# Patient Record
Sex: Female | Born: 1945
Health system: Southern US, Community
[De-identification: ages and names within clinical notes are randomized; demographics above are authoritative.]

## PROBLEM LIST (undated history)

## (undated) DIAGNOSIS — R569 Unspecified convulsions: Secondary | ICD-10-CM

## (undated) DIAGNOSIS — C801 Malignant (primary) neoplasm, unspecified: Secondary | ICD-10-CM

## (undated) DIAGNOSIS — M109 Gout, unspecified: Secondary | ICD-10-CM

## (undated) DIAGNOSIS — G4730 Sleep apnea, unspecified: Secondary | ICD-10-CM

## (undated) DIAGNOSIS — I10 Essential (primary) hypertension: Secondary | ICD-10-CM

## (undated) DIAGNOSIS — K219 Gastro-esophageal reflux disease without esophagitis: Secondary | ICD-10-CM

## (undated) DIAGNOSIS — I714 Abdominal aortic aneurysm, without rupture, unspecified: Secondary | ICD-10-CM

## (undated) DIAGNOSIS — I2510 Atherosclerotic heart disease of native coronary artery without angina pectoris: Secondary | ICD-10-CM

## (undated) DIAGNOSIS — N189 Chronic kidney disease, unspecified: Secondary | ICD-10-CM

## (undated) DIAGNOSIS — I509 Heart failure, unspecified: Secondary | ICD-10-CM

## (undated) DIAGNOSIS — E119 Type 2 diabetes mellitus without complications: Secondary | ICD-10-CM

## (undated) DIAGNOSIS — J449 Chronic obstructive pulmonary disease, unspecified: Secondary | ICD-10-CM

## (undated) DIAGNOSIS — H548 Legal blindness, as defined in USA: Secondary | ICD-10-CM

## (undated) DIAGNOSIS — T4145XA Adverse effect of unspecified anesthetic, initial encounter: Secondary | ICD-10-CM

## (undated) DIAGNOSIS — N83201 Unspecified ovarian cyst, right side: Secondary | ICD-10-CM

## (undated) DIAGNOSIS — R0600 Dyspnea, unspecified: Secondary | ICD-10-CM

## (undated) HISTORY — PX: OOPHORECTOMY: SHX86

## (undated) HISTORY — PX: CHOLECYSTECTOMY: SHX55

## (undated) HISTORY — PX: OTHER SURGICAL HISTORY: SHX169

---

## 2004-03-18 ENCOUNTER — Other Ambulatory Visit: Payer: Self-pay

## 2004-03-18 ENCOUNTER — Inpatient Hospital Stay: Payer: Self-pay

## 2004-03-19 ENCOUNTER — Other Ambulatory Visit: Payer: Self-pay

## 2005-01-11 ENCOUNTER — Emergency Department: Payer: Self-pay | Admitting: Emergency Medicine

## 2005-01-11 ENCOUNTER — Other Ambulatory Visit: Payer: Self-pay

## 2005-01-13 ENCOUNTER — Inpatient Hospital Stay: Payer: Self-pay | Admitting: Internal Medicine

## 2005-01-13 ENCOUNTER — Other Ambulatory Visit: Payer: Self-pay

## 2005-03-11 ENCOUNTER — Other Ambulatory Visit: Payer: Self-pay

## 2005-03-12 ENCOUNTER — Observation Stay: Payer: Self-pay | Admitting: Infectious Diseases

## 2005-03-22 ENCOUNTER — Ambulatory Visit: Payer: Self-pay | Admitting: Nurse Practitioner

## 2009-04-09 ENCOUNTER — Emergency Department: Payer: Self-pay | Admitting: Emergency Medicine

## 2009-08-29 ENCOUNTER — Ambulatory Visit: Payer: Self-pay | Admitting: Unknown Physician Specialty

## 2010-09-28 ENCOUNTER — Ambulatory Visit: Payer: Self-pay | Admitting: Unknown Physician Specialty

## 2011-01-09 ENCOUNTER — Ambulatory Visit: Payer: Self-pay | Admitting: Otolaryngology

## 2011-01-10 LAB — PATHOLOGY REPORT

## 2011-02-17 ENCOUNTER — Emergency Department: Payer: Self-pay | Admitting: Emergency Medicine

## 2011-03-18 ENCOUNTER — Inpatient Hospital Stay: Payer: Self-pay | Admitting: Internal Medicine

## 2011-05-19 ENCOUNTER — Emergency Department: Payer: Self-pay | Admitting: Emergency Medicine

## 2011-05-28 DIAGNOSIS — R569 Unspecified convulsions: Secondary | ICD-10-CM

## 2011-05-28 DIAGNOSIS — T8859XA Other complications of anesthesia, initial encounter: Secondary | ICD-10-CM

## 2011-05-28 HISTORY — PX: BYPASS GRAFT: SHX909

## 2011-05-28 HISTORY — PX: CARDIAC SURGERY: SHX584

## 2011-05-28 HISTORY — DX: Other complications of anesthesia, initial encounter: T88.59XA

## 2011-05-28 HISTORY — PX: CORONARY ARTERY BYPASS GRAFT: SHX141

## 2011-05-28 HISTORY — DX: Unspecified convulsions: R56.9

## 2011-06-03 ENCOUNTER — Ambulatory Visit: Payer: Self-pay | Admitting: Internal Medicine

## 2011-06-12 DIAGNOSIS — Z951 Presence of aortocoronary bypass graft: Secondary | ICD-10-CM

## 2011-06-12 HISTORY — PX: CORONARY ARTERY BYPASS GRAFT: SHX141

## 2011-06-12 HISTORY — DX: Presence of aortocoronary bypass graft: Z95.1

## 2011-12-16 DIAGNOSIS — D3A090 Benign carcinoid tumor of the bronchus and lung: Secondary | ICD-10-CM | POA: Insufficient documentation

## 2011-12-20 DIAGNOSIS — Z951 Presence of aortocoronary bypass graft: Secondary | ICD-10-CM | POA: Insufficient documentation

## 2011-12-20 DIAGNOSIS — H3530 Unspecified macular degeneration: Secondary | ICD-10-CM | POA: Insufficient documentation

## 2011-12-20 DIAGNOSIS — I4891 Unspecified atrial fibrillation: Secondary | ICD-10-CM | POA: Insufficient documentation

## 2012-07-01 ENCOUNTER — Ambulatory Visit: Payer: Self-pay | Admitting: Unknown Physician Specialty

## 2012-12-23 ENCOUNTER — Observation Stay: Payer: Self-pay | Admitting: Internal Medicine

## 2012-12-23 LAB — TROPONIN I
Troponin-I: <0.02 ng/mL
Troponin-I: <0.02 ng/mL
Troponin-I: <0.02 ng/mL

## 2012-12-23 LAB — HEPATIC FUNCTION PANEL A (ARMC)
Albumin: 3.6 g/dL (ref 3.4–5.0)
Alkaline Phosphatase: 63 U/L (ref 50–136)
Bilirubin, Direct: <0.05 mg/dL (ref 0.00–0.20)
Bilirubin,Total: 0.3 mg/dL (ref 0.2–1.0)
SGOT(AST): 21 U/L (ref 15–37)
SGPT (ALT): 20 U/L (ref 12–78)
Total Protein: 7.5 g/dL (ref 6.4–8.2)

## 2012-12-23 LAB — CBC
HCT: 38.1 % (ref 35.0–47.0)
HGB: 12.7 g/dL (ref 12.0–16.0)
MCH: 28.2 pg (ref 26.0–34.0)
MCHC: 33.5 g/dL (ref 32.0–36.0)
MCV: 84 fL (ref 80–100)
Platelet: 156 10*3/uL (ref 150–440)
RBC: 4.52 10*6/uL (ref 3.80–5.20)
RDW: 14.9 % — ABNORMAL HIGH (ref 11.5–14.5)
WBC: 5.4 10*3/uL (ref 3.6–11.0)

## 2012-12-23 LAB — BASIC METABOLIC PANEL
Anion Gap: 3 — ABNORMAL LOW (ref 7–16)
BUN: 34 mg/dL — ABNORMAL HIGH (ref 7–18)
Calcium, Total: 9.2 mg/dL (ref 8.5–10.1)
Chloride: 108 mmol/L — ABNORMAL HIGH (ref 98–107)
Co2: 30 mmol/L (ref 21–32)
Creatinine: 2.13 mg/dL — ABNORMAL HIGH (ref 0.60–1.30)
EGFR (African American): 27 — ABNORMAL LOW
EGFR (Non-African Amer.): 23 — ABNORMAL LOW
Glucose: 117 mg/dL — ABNORMAL HIGH (ref 65–99)
Osmolality: 290 (ref 275–301)
Potassium: 4.3 mmol/L (ref 3.5–5.1)
Sodium: 141 mmol/L (ref 136–145)

## 2012-12-23 LAB — CK
CK, Total: 119 U/L (ref 21–215)
CK, Total: 116 U/L (ref 21–215)

## 2012-12-23 LAB — PRO B NATRIURETIC PEPTIDE: B-Type Natriuretic Peptide: 562 pg/mL — ABNORMAL HIGH (ref 0–125)

## 2012-12-23 LAB — CK TOTAL AND CKMB (NOT AT ARMC): CK-MB: 0.9 ng/mL (ref 0.5–3.6)

## 2012-12-24 DIAGNOSIS — R079 Chest pain, unspecified: Secondary | ICD-10-CM

## 2012-12-24 LAB — BASIC METABOLIC PANEL
Anion Gap: 4 — ABNORMAL LOW (ref 7–16)
BUN: 33 mg/dL — ABNORMAL HIGH (ref 7–18)
Calcium, Total: 8.4 mg/dL — ABNORMAL LOW (ref 8.5–10.1)
Chloride: 111 mmol/L — ABNORMAL HIGH (ref 98–107)
Co2: 28 mmol/L (ref 21–32)
Creatinine: 2.04 mg/dL — ABNORMAL HIGH (ref 0.60–1.30)
EGFR (African American): 28 — ABNORMAL LOW
EGFR (Non-African Amer.): 24 — ABNORMAL LOW
Potassium: 4.3 mmol/L (ref 3.5–5.1)
Sodium: 143 mmol/L (ref 136–145)

## 2012-12-24 LAB — CBC WITH DIFFERENTIAL/PLATELET
Basophil #: 0 10*3/uL (ref 0.0–0.1)
Basophil %: 0.5 %
Eosinophil #: 0.1 10*3/uL (ref 0.0–0.7)
Eosinophil %: 3.2 %
HGB: 12 g/dL (ref 12.0–16.0)
MCH: 27.9 pg (ref 26.0–34.0)
MCHC: 33.2 g/dL (ref 32.0–36.0)
MCV: 84 fL (ref 80–100)
Monocyte #: 0.4 x10 3/mm (ref 0.2–0.9)
Monocyte %: 9.2 %
Neutrophil #: 2.4 10*3/uL (ref 1.4–6.5)
Neutrophil %: 53 %
Platelet: 138 10*3/uL — ABNORMAL LOW (ref 150–440)
RDW: 14.4 % (ref 11.5–14.5)

## 2012-12-24 LAB — TSH: Thyroid Stimulating Horm: 0.25 u[IU]/mL — ABNORMAL LOW

## 2012-12-24 LAB — HEMOGLOBIN A1C: Hemoglobin A1C: 7.2 % — ABNORMAL HIGH (ref 4.2–6.3)

## 2012-12-24 LAB — LIPID PANEL
Cholesterol: 111 mg/dL (ref 0–200)
HDL Cholesterol: 31 mg/dL — ABNORMAL LOW (ref 40–60)
Ldl Cholesterol, Calc: 58 mg/dL (ref 0–100)
Triglycerides: 112 mg/dL (ref 0–200)
VLDL Cholesterol, Calc: 22 mg/dL (ref 5–40)

## 2012-12-24 LAB — MAGNESIUM: Magnesium: 2.1 mg/dL

## 2013-05-27 HISTORY — PX: BRONCHOSCOPY: SUR163

## 2014-02-28 ENCOUNTER — Emergency Department: Payer: Self-pay | Admitting: Emergency Medicine

## 2014-02-28 LAB — CBC
HCT: 40 % (ref 35.0–47.0)
HGB: 12.9 g/dL (ref 12.0–16.0)
MCH: 28.4 pg (ref 26.0–34.0)
MCHC: 32.1 g/dL (ref 32.0–36.0)
MCV: 88 fL (ref 80–100)
PLATELETS: 173 10*3/uL (ref 150–440)
RBC: 4.53 10*6/uL (ref 3.80–5.20)
RDW: 14.5 % (ref 11.5–14.5)
WBC: 4.8 10*3/uL (ref 3.6–11.0)

## 2014-02-28 LAB — URINALYSIS, COMPLETE
BACTERIA: NONE SEEN
Bilirubin,UR: NEGATIVE
GLUCOSE, UR: NEGATIVE mg/dL (ref 0–75)
KETONE: NEGATIVE
Leukocyte Esterase: NEGATIVE
NITRITE: NEGATIVE
PH: 5 (ref 4.5–8.0)
Protein: NEGATIVE
RBC, UR: NONE SEEN /HPF (ref 0–5)
Specific Gravity: 1.008 (ref 1.003–1.030)
WBC UR: <1 /HPF (ref 0–5)

## 2014-02-28 LAB — BASIC METABOLIC PANEL
Anion Gap: 5 — ABNORMAL LOW (ref 7–16)
BUN: 30 mg/dL — ABNORMAL HIGH (ref 7–18)
CALCIUM: 8.8 mg/dL (ref 8.5–10.1)
CHLORIDE: 109 mmol/L — AB (ref 98–107)
CO2: 27 mmol/L (ref 21–32)
CREATININE: 1.84 mg/dL — AB (ref 0.60–1.30)
EGFR (Non-African Amer.): 29 — ABNORMAL LOW
GFR CALC AF AMER: 35 — AB
Glucose: 75 mg/dL (ref 65–99)
OSMOLALITY: 286 (ref 275–301)
Potassium: 4.2 mmol/L (ref 3.5–5.1)
SODIUM: 141 mmol/L (ref 136–145)

## 2014-02-28 LAB — LIPASE, BLOOD: LIPASE: 261 U/L (ref 73–393)

## 2014-02-28 LAB — TROPONIN I: Troponin-I: <0.02 ng/mL

## 2014-03-29 ENCOUNTER — Emergency Department: Payer: Self-pay | Admitting: Emergency Medicine

## 2014-03-29 LAB — COMPREHENSIVE METABOLIC PANEL
ALBUMIN: 3.5 g/dL (ref 3.4–5.0)
ALK PHOS: 77 U/L
AST: 16 U/L (ref 15–37)
Anion Gap: 6 — ABNORMAL LOW (ref 7–16)
BILIRUBIN TOTAL: 0.3 mg/dL (ref 0.2–1.0)
BUN: 28 mg/dL — ABNORMAL HIGH (ref 7–18)
CO2: 25 mmol/L (ref 21–32)
Calcium, Total: 8.6 mg/dL (ref 8.5–10.1)
Chloride: 113 mmol/L — ABNORMAL HIGH (ref 98–107)
Creatinine: 1.71 mg/dL — ABNORMAL HIGH (ref 0.60–1.30)
EGFR (African American): 38 — ABNORMAL LOW
EGFR (Non-African Amer.): 31 — ABNORMAL LOW
Glucose: 72 mg/dL (ref 65–99)
Osmolality: 291 (ref 275–301)
POTASSIUM: 4.4 mmol/L (ref 3.5–5.1)
SGPT (ALT): 21 U/L
SODIUM: 144 mmol/L (ref 136–145)
Total Protein: 7.1 g/dL (ref 6.4–8.2)

## 2014-03-29 LAB — URINALYSIS, COMPLETE
BACTERIA: NONE SEEN
Bilirubin,UR: NEGATIVE
Glucose,UR: NEGATIVE mg/dL (ref 0–75)
Ketone: NEGATIVE
LEUKOCYTE ESTERASE: NEGATIVE
Nitrite: NEGATIVE
PROTEIN: NEGATIVE
Ph: 5 (ref 4.5–8.0)
RBC,UR: 1 /HPF (ref 0–5)
Specific Gravity: 1.016 (ref 1.003–1.030)
WBC UR: 1 /HPF (ref 0–5)

## 2014-03-29 LAB — CBC WITH DIFFERENTIAL/PLATELET
BASOS ABS: 0.1 10*3/uL (ref 0.0–0.1)
BASOS PCT: 1.4 %
EOS ABS: 0.1 10*3/uL (ref 0.0–0.7)
Eosinophil %: 3 %
HCT: 39.3 % (ref 35.0–47.0)
HGB: 12.6 g/dL (ref 12.0–16.0)
LYMPHS ABS: 1.5 10*3/uL (ref 1.0–3.6)
LYMPHS PCT: 31.9 %
MCH: 28.5 pg (ref 26.0–34.0)
MCHC: 32.1 g/dL (ref 32.0–36.0)
MCV: 89 fL (ref 80–100)
MONO ABS: 0.5 x10 3/mm (ref 0.2–0.9)
Monocyte %: 10 %
NEUTROS PCT: 53.7 %
Neutrophil #: 2.5 10*3/uL (ref 1.4–6.5)
Platelet: 160 10*3/uL (ref 150–440)
RBC: 4.42 10*6/uL (ref 3.80–5.20)
RDW: 14.2 % (ref 11.5–14.5)
WBC: 4.6 10*3/uL (ref 3.6–11.0)

## 2014-03-29 LAB — TROPONIN I

## 2014-03-31 ENCOUNTER — Ambulatory Visit: Payer: Self-pay | Admitting: Vascular Surgery

## 2014-04-06 ENCOUNTER — Inpatient Hospital Stay: Payer: Self-pay | Admitting: Vascular Surgery

## 2014-04-07 LAB — CBC WITH DIFFERENTIAL/PLATELET
BASOS PCT: 0.5 %
Basophil #: 0 10*3/uL (ref 0.0–0.1)
EOS PCT: 2 %
Eosinophil #: 0.1 10*3/uL (ref 0.0–0.7)
HCT: 32.2 % — ABNORMAL LOW (ref 35.0–47.0)
HGB: 10.3 g/dL — AB (ref 12.0–16.0)
LYMPHS PCT: 22.5 %
Lymphocyte #: 1 10*3/uL (ref 1.0–3.6)
MCH: 28.6 pg (ref 26.0–34.0)
MCHC: 31.9 g/dL — ABNORMAL LOW (ref 32.0–36.0)
MCV: 90 fL (ref 80–100)
MONOS PCT: 7.4 %
Monocyte #: 0.3 x10 3/mm (ref 0.2–0.9)
Neutrophil #: 2.9 10*3/uL (ref 1.4–6.5)
Neutrophil %: 67.6 %
Platelet: 115 10*3/uL — ABNORMAL LOW (ref 150–440)
RBC: 3.59 10*6/uL — ABNORMAL LOW (ref 3.80–5.20)
RDW: 14.2 % (ref 11.5–14.5)
WBC: 4.3 10*3/uL (ref 3.6–11.0)

## 2014-04-07 LAB — COMPREHENSIVE METABOLIC PANEL
ALBUMIN: 2.7 g/dL — AB (ref 3.4–5.0)
ALT: 32 U/L
AST: 29 U/L (ref 15–37)
Alkaline Phosphatase: 61 U/L
Anion Gap: 6 — ABNORMAL LOW (ref 7–16)
BUN: 25 mg/dL — ABNORMAL HIGH (ref 7–18)
Bilirubin,Total: 0.3 mg/dL (ref 0.2–1.0)
Calcium, Total: 7.8 mg/dL — ABNORMAL LOW (ref 8.5–10.1)
Chloride: 108 mmol/L — ABNORMAL HIGH (ref 98–107)
Co2: 31 mmol/L (ref 21–32)
Creatinine: 1.95 mg/dL — ABNORMAL HIGH (ref 0.60–1.30)
EGFR (African American): 33 — ABNORMAL LOW
EGFR (Non-African Amer.): 27 — ABNORMAL LOW
GLUCOSE: 90 mg/dL (ref 65–99)
Osmolality: 293 (ref 275–301)
Potassium: 4.5 mmol/L (ref 3.5–5.1)
Sodium: 145 mmol/L (ref 136–145)
Total Protein: 5.8 g/dL — ABNORMAL LOW (ref 6.4–8.2)

## 2014-04-07 LAB — PROTIME-INR
INR: 1.1
PROTHROMBIN TIME: 14.2 s (ref 11.5–14.7)

## 2014-04-07 LAB — PHOSPHORUS: PHOSPHORUS: 4.4 mg/dL (ref 2.5–4.9)

## 2014-04-07 LAB — APTT: ACTIVATED PTT: 33.8 s (ref 23.6–35.9)

## 2014-04-07 LAB — MAGNESIUM: Magnesium: 2 mg/dL

## 2014-06-29 DIAGNOSIS — E1121 Type 2 diabetes mellitus with diabetic nephropathy: Secondary | ICD-10-CM | POA: Diagnosis not present

## 2014-06-29 DIAGNOSIS — I10 Essential (primary) hypertension: Secondary | ICD-10-CM | POA: Diagnosis not present

## 2014-06-29 DIAGNOSIS — R300 Dysuria: Secondary | ICD-10-CM | POA: Diagnosis not present

## 2014-06-29 DIAGNOSIS — M79671 Pain in right foot: Secondary | ICD-10-CM | POA: Diagnosis not present

## 2014-06-29 DIAGNOSIS — N390 Urinary tract infection, site not specified: Secondary | ICD-10-CM | POA: Diagnosis not present

## 2014-07-08 DIAGNOSIS — M79671 Pain in right foot: Secondary | ICD-10-CM | POA: Diagnosis not present

## 2014-07-28 DIAGNOSIS — I714 Abdominal aortic aneurysm, without rupture: Secondary | ICD-10-CM | POA: Diagnosis not present

## 2014-07-28 DIAGNOSIS — F17200 Nicotine dependence, unspecified, uncomplicated: Secondary | ICD-10-CM | POA: Diagnosis not present

## 2014-07-28 DIAGNOSIS — I739 Peripheral vascular disease, unspecified: Secondary | ICD-10-CM | POA: Diagnosis not present

## 2014-07-28 DIAGNOSIS — I10 Essential (primary) hypertension: Secondary | ICD-10-CM | POA: Diagnosis not present

## 2014-08-17 DIAGNOSIS — M10071 Idiopathic gout, right ankle and foot: Secondary | ICD-10-CM | POA: Diagnosis not present

## 2014-08-17 DIAGNOSIS — I25810 Atherosclerosis of coronary artery bypass graft(s) without angina pectoris: Secondary | ICD-10-CM | POA: Diagnosis not present

## 2014-08-17 DIAGNOSIS — N183 Chronic kidney disease, stage 3 (moderate): Secondary | ICD-10-CM | POA: Diagnosis not present

## 2014-08-17 DIAGNOSIS — E1121 Type 2 diabetes mellitus with diabetic nephropathy: Secondary | ICD-10-CM | POA: Diagnosis not present

## 2014-09-09 DIAGNOSIS — G4730 Sleep apnea, unspecified: Secondary | ICD-10-CM | POA: Diagnosis not present

## 2014-09-09 DIAGNOSIS — I2510 Atherosclerotic heart disease of native coronary artery without angina pectoris: Secondary | ICD-10-CM | POA: Diagnosis not present

## 2014-09-09 DIAGNOSIS — E669 Obesity, unspecified: Secondary | ICD-10-CM | POA: Diagnosis not present

## 2014-09-09 DIAGNOSIS — I10 Essential (primary) hypertension: Secondary | ICD-10-CM | POA: Diagnosis not present

## 2014-09-09 DIAGNOSIS — H3530 Unspecified macular degeneration: Secondary | ICD-10-CM | POA: Diagnosis not present

## 2014-09-09 DIAGNOSIS — E119 Type 2 diabetes mellitus without complications: Secondary | ICD-10-CM | POA: Diagnosis not present

## 2014-09-09 DIAGNOSIS — M109 Gout, unspecified: Secondary | ICD-10-CM | POA: Diagnosis not present

## 2014-09-09 DIAGNOSIS — C3490 Malignant neoplasm of unspecified part of unspecified bronchus or lung: Secondary | ICD-10-CM | POA: Diagnosis not present

## 2014-09-09 DIAGNOSIS — J449 Chronic obstructive pulmonary disease, unspecified: Secondary | ICD-10-CM | POA: Diagnosis not present

## 2014-09-13 DIAGNOSIS — E119 Type 2 diabetes mellitus without complications: Secondary | ICD-10-CM | POA: Diagnosis not present

## 2014-09-13 DIAGNOSIS — M109 Gout, unspecified: Secondary | ICD-10-CM | POA: Diagnosis not present

## 2014-09-13 DIAGNOSIS — C3490 Malignant neoplasm of unspecified part of unspecified bronchus or lung: Secondary | ICD-10-CM | POA: Diagnosis not present

## 2014-09-13 DIAGNOSIS — J449 Chronic obstructive pulmonary disease, unspecified: Secondary | ICD-10-CM | POA: Diagnosis not present

## 2014-09-14 DIAGNOSIS — M109 Gout, unspecified: Secondary | ICD-10-CM | POA: Diagnosis not present

## 2014-09-14 DIAGNOSIS — E119 Type 2 diabetes mellitus without complications: Secondary | ICD-10-CM | POA: Diagnosis not present

## 2014-09-14 DIAGNOSIS — I10 Essential (primary) hypertension: Secondary | ICD-10-CM | POA: Diagnosis not present

## 2014-09-14 DIAGNOSIS — G4730 Sleep apnea, unspecified: Secondary | ICD-10-CM | POA: Diagnosis not present

## 2014-09-14 DIAGNOSIS — H3530 Unspecified macular degeneration: Secondary | ICD-10-CM | POA: Diagnosis not present

## 2014-09-14 DIAGNOSIS — J449 Chronic obstructive pulmonary disease, unspecified: Secondary | ICD-10-CM | POA: Diagnosis not present

## 2014-09-14 DIAGNOSIS — E669 Obesity, unspecified: Secondary | ICD-10-CM | POA: Diagnosis not present

## 2014-09-14 DIAGNOSIS — C3490 Malignant neoplasm of unspecified part of unspecified bronchus or lung: Secondary | ICD-10-CM | POA: Diagnosis not present

## 2014-09-14 DIAGNOSIS — I2510 Atherosclerotic heart disease of native coronary artery without angina pectoris: Secondary | ICD-10-CM | POA: Diagnosis not present

## 2014-09-15 DIAGNOSIS — E119 Type 2 diabetes mellitus without complications: Secondary | ICD-10-CM | POA: Diagnosis not present

## 2014-09-15 DIAGNOSIS — I2510 Atherosclerotic heart disease of native coronary artery without angina pectoris: Secondary | ICD-10-CM | POA: Diagnosis not present

## 2014-09-15 DIAGNOSIS — E669 Obesity, unspecified: Secondary | ICD-10-CM | POA: Diagnosis not present

## 2014-09-15 DIAGNOSIS — G4730 Sleep apnea, unspecified: Secondary | ICD-10-CM | POA: Diagnosis not present

## 2014-09-15 DIAGNOSIS — M109 Gout, unspecified: Secondary | ICD-10-CM | POA: Diagnosis not present

## 2014-09-15 DIAGNOSIS — I10 Essential (primary) hypertension: Secondary | ICD-10-CM | POA: Diagnosis not present

## 2014-09-15 DIAGNOSIS — H3530 Unspecified macular degeneration: Secondary | ICD-10-CM | POA: Diagnosis not present

## 2014-09-15 DIAGNOSIS — J449 Chronic obstructive pulmonary disease, unspecified: Secondary | ICD-10-CM | POA: Diagnosis not present

## 2014-09-15 DIAGNOSIS — C3490 Malignant neoplasm of unspecified part of unspecified bronchus or lung: Secondary | ICD-10-CM | POA: Diagnosis not present

## 2014-09-16 NOTE — H&P (Signed)
PATIENT NAME:  Michelle Murillo, SPIKER MR#:  [id number] DATE OF BIRTH:  1946/05/13  DATE OF ADMISSION:  12/23/2012  PRIMARY CARE PHYSICIAN: Lorenza Evangelist, MD, from [facility] group.  REFERRING PHYSICIAN: Loney Hering, MD  CHIEF COMPLAINT: Chest pressure.   HISTORY OF PRESENT ILLNESS: The patient is a 69 year old pleasant female with a past medical history of left lower lobe lung cancer, she was following at [facility], diabetes mellitus, coronary artery disease, status post CABG in January 2013 and had 4-vessel bypass grafting done at [facility], legally blind and multiple other medical problems, who is presenting to the ER with a chief complaint of chest pressure in the midsternal area associated with shortness of breath, nausea and diaphoresis. This chest pressure started while she was resting at a friend's house. It lasted approximately for 90 minutes, and her friend brought her into the ER. A 12-lead EKG has revealed right bundle branch block. The patient was given aspirin and sublingual nitroglycerin, which eased off her chest pressure. Given her significant cardiac history and recent history of coronary artery bypass grafting done in January 2013, hospitalist team is called to admit the patient. Her first 2 sets of troponins were negative. The patient is refusing to get any kind of stress test done, and she prefers talking to Dr. Nehemiah Massed before she makes any decision. Her son is at bedside. During my examination, the patient denies any chest pain or pressure, no radiation.   PAST MEDICAL HISTORY:  1. Diabetes mellitus.  2. Hypertension. 3. Left lower lobe lung cancer.  4. Gastroesophageal reflux disease. 5. Occasional lower extremity edema. 6. History of chronic obstructive pulmonary disease.  7. Legally blind from macular degeneration.  8. Hyperlipidemia.  9. Still smokes.   PAST SURGICAL HISTORY:  1. Coronary artery bypass grafting of 4 vessels in January 2013 at [facility].  2. History of  stent placement in the past.  3. Appendectomy.  4. Cholecystectomy.  5. Tonsillectomy. 6. Recent vocal cord polyp removal in August 2012.   ALLERGIES: THE PATIENT IS ALLERGIC TO MORPHINE.   PSYCHOSOCIAL HISTORY: Lives at home, lives alone. Still smokes 5 to 6 cigarettes per day. So far, she has smoked for approximately 40 years. Denies any alcohol or illicit drug usage.   FAMILY HISTORY: Her grandpa died of MI. Mother had history of dementia, congestive heart failure, diverticulosis. Father died from liver and esophageal cancer.   HOME MEDICATIONS:  1. Aspirin 81 mg once daily 2. Omeprazole 40 mg 2 times a day.  3. Metoprolol 12.5 mg twice a day. 4. Lisinopril 40 mg once daily.  5. Gabapentin 300 mg once a day.  6. Flovent 2 puff inhalation twice a day.    REVIEW OF SYSTEMS: CONSTITUTIONAL: Denies any fever or fatigue.  EYES: Denies any blurry vision. Has macular degeneration and is legally blind.  ENT: Denies epistaxis, nasal discharge. RESPIRATION: Denies cough, COPD. She has left lower lobe lung cancer.  CARDIOVASCULAR: Chest pressure with no palpitations.  GASTROINTESTINAL: Nausea while she was having chest pressure, but denies any now. Denies vomiting or diarrhea. Has history of GERD.  GENITOURINARY: No dysuria or hematuria.  GYNECOLOGIC AND BREASTS: Denies breast mass or vaginal discharge.  ENDOCRINE: Denies polyuria, nocturia.  HEMATOLOGIC AND LYMPHATIC: No anemia or easy bruising.  INTEGUMENTARY: No acne, rash, lesions.  MUSCULOSKELETAL: No joint pain, gout.  NEUROLOGIC: Denies any history of ataxia or dementia.  PSYCHIATRIC: No ADD, OCD, bipolar disorder.   PHYSICAL EXAMINATION:  VITAL SIGNS: Temperature 98.5,  pulse 62, respirations 18, blood pressure 93/59, pulse ox 96%.  GENERAL APPEARANCE: Not under acute distress. Morbidly obese.  HEENT: Normocephalic, atraumatic. Pupils are equally reacting to light and accommodation. No scleral icterus. No conjunctival  injection. No sinus tenderness. No postnasal drip.  NECK: Supple. No JVD. Range of motion is intact. No thyromegaly.  LUNGS: Clear to auscultation, but decreased breath sounds on the left lower lobe area.  No anterior chest wall tenderness on palpation.  CARDIAC: S1, S2 normal. Regular rate and rhythm. Positive murmur.  GASTROINTESTINAL: Soft, obese. Bowel sounds are positive in all 4 quadrants. Nontender, nondistended. No hepatosplenomegaly.  NEUROLOGIC: Awake, alert and oriented x3. Motor and sensory are grossly intact. Reflexes are 2+.  EXTREMITIES: No edema. No cyanosis. No clubbing.  SKIN: Warm to touch. Normal turgor. No rashes. No lesions.  MUSCULOSKELETAL: No joint effusion, tenderness or erythema.  PSYCHIATRIC: Normal mood and affect.   LABORATORY AND IMAGING STUDIES: A 12-lead EKG: Normal sinus rhythm, right bundle branch block and left ventricular hypertrophy. Chest x-ray revealed fullness in the left lower lobe area. It could be from lung cancer as reported by the patient. Glucose 117. BNP 562. BUN 24, creatinine 2.13, sodium 141, potassium 4.3, chloride 108, CO2 30. Anion gap 3. GFR 27. Serum calcium 9.2. LFTs are normal range. CK total 147, CPK-MB 0.9, troponin T less than 0.02 x2. CBC is normal.   ASSESSMENT AND PLAN: A 69 year old female with left lower lobe lung cancer. The patient has refused chemotherapy, radiation and surgery, with a history of coronary artery disease status post coronary artery bypass grafting on 4 vessels in January 2013 at [facility], coming with midsternal chest pressure and refusing stress test at this time and prefers discussion with Dr. Nehemiah Massed. Will be admitted with the following assessment and plan.   1. Chest pressure. Rule out acute myocardial infarction. Will put her on telemetry. Will cycle cardiac biomarkers. ACS protocol. Cardiology consult is placed to Dr. Nehemiah Massed. The patient is refusing stress test.  2. Left lower lobe lung cancer. Follow up with  [facility] as an outpatient.  3. Acute on chronic renal insufficiency. Will provide her gentle hydration.  4. Diabetes mellitus. She will be on sliding scale insulin.  5. Hypertension: Monitor blood pressure closely.  6. Chronic obstructive pulmonary disease. The patient continues to smoke and counseled about smoking. Will provide her nebulizer treatments on as-needed basis.   The diagnosis and plan of care was discussed with her and son at bedside. They both verbalized understanding of the plan.   CODE STATUS: She is full code. Son is medical power of attorney.   TOTAL TIME SPENT ON ADMISSION: 50 minutes.  Transferred to [facility], Dr. Kem Kays, in a.m.  ____________________________ Nicholes Mango, MD ag:OSi D: 12/23/2012 06:55:52 ET T: 12/23/2012 07:18:55 ET JOB#: [id number]  cc: Nicholes Mango, MD, <Dictator> Nicholes Mango MD ELECTRONICALLY SIGNED 01/01/2013 7:22

## 2014-09-16 NOTE — Consult Note (Signed)
Brief Consult Note: Diagnosis: [place]/CAD/Lung Ca.   Patient was seen by consultant.   Consult note dictated.   Recommend further assessment or treatment.   Orders entered.   Discussed with Attending MD.   Comments: IMP [place]? CAD Lung Ca Obesity Smoking HTN Hyperlipidemia . PLAN ASA 81 daily Tele Statin therapy 02 Bp control Conservative therpay medically Consider pul input ROMI F/U cardiac enzymes Do not rec cath at this point Agree with ECHO.  Electronic Signatures: Lujean Amel D (MD)  (Signed 31-Jul-14 07:44)  Authored: Brief Consult Note   Last Updated: 31-Jul-14 07:44 by Lujean Amel D (MD)

## 2014-09-17 NOTE — Op Note (Signed)
PATIENT NAME:  Michelle Murillo, Michelle Murillo MR#:  [id number] DATE OF BIRTH:  [birth year]  DATE OF PROCEDURE:  04/06/2014  PREOPERATIVE DIAGNOSES: A 6 cm right common iliac artery aneurysm extending to the distal aorta.   POSTOPERATIVE DIAGNOSIS: A 6 cm right common iliac artery aneurysm extending to the distal aorta.   PROCEDURES PERFORMED: 1.  Repair of iliac aneurysm with a Gore Excluder endoprosthesis, 23 x 12 x 18.  2.  Placement of a right iliac extender cuff, 12 x 7.  3.  Selection of right internal iliac artery branch, left femoral approach, third order catheter placement.  4.  Selective injection of second right internal iliac artery branch, left femoral approach.  5.  Coil embolization of right internal iliac artery branch.  6.  Coil embolization of second internal iliac artery branch.  7.  Introduction of catheter into aorta, right femoral approach.  8.  Introduction of catheter into aorta, left femoral approach.  9.  Bilateral Perclose device for percutaneous access and closure using the pre-close technique.   SURGEON: Algernon Huxley, MD  CO-SURGEON:  Katha Cabal, MD  ANESTHESIA: General by endotracheal intubation.   FLUIDS: Per anesthesia record.   ESTIMATED BLOOD LOSS: Minimal.   SPECIMEN: None.   INDICATIONS: Michelle Murillo is a 69 year old woman who presented with a large 6 cm common iliac artery aneurysm. This aneurysm extends to the distal aorta and is therefore not acceptable for repair using an isolated limb graft or Viabahn stent and will require complete endograft prosthesis for proper treatment. Because of the large nature of the aneurysm, occlusion of the internal iliac is also required and this will be coiled at the same time as the surgery as requested by the patient's pulmonologist, who felt that it was imperative to have just one anesthesia.   The risks and benefits have been reviewed in detail with the patient by Dr. Lucky Cowboy. All questions have been answered. The patient  agrees to proceed.   DESCRIPTION OF PROCEDURE: The patient is taken to the special procedure suite, placed in the supine position. After adequate general anesthesia is induced and appropriate invasive monitors are placed, she is positioned supine and prepped from the upper abdomen down to the mid thigh. She is then draped in a sterile fashion. Appropriate timeout is called.   Ultrasound is placed in a sterile sleeve. With Dr. Lucky Cowboy on the left and myself on the right, the common femoral arteries were identified with ultrasound. There were both echolucent and pulsatile indicating patency. Image was recorded for the permanent record for each one and then under real-time visualization, Seldinger needle on the left, and a micropuncture needle on the right, are used to access the common femoral. A J-wire is advanced, followed by a 5 [place] sheath. Two Perclose devices are then used and deployed at the 11:00 and 1:00 positions, but the knots are not secured at this time. Sheaths were then upsized to an 8 [place] sheath. Wire and pigtail catheter advanced to the level of T12 from the right and an AP projection of the aorta is obtained. Bifurcation and internal iliac on the left are identified. With Dr. Lucky Cowboy working from the left, using a rim catheter and stiff angled Glidewire, the internal iliac is engaged crossing over the bifurcation and down into the internal iliac on the right. Subsequently, an 8 [place] Ansell sheath is advanced over and positioned into the internal. Using a Kumpe catheter, first of 2 dominant branches are engaged. Angiography is performed to  determine proper size and locate the next major division of the branches, and subsequently three 14 x 14 coils are deployed without difficulty. The pigtail catheter is then repositioned into the second dominant branch and initially a 10 x 14 coil and then an 8 x 14 coil are deployed. It should be noted when the second branch is engaged again angiography was  performed through hand injection to better determine where the next major division was. Subsequently, 2 more 20 x 14 coils are deployed in the distal trunk of the internal.  At this point, hand injection through the sheath demonstrates an excellent packing of the coils with minimal forward flow and the sheath is now pulled back into the aorta, and a wire advanced into the descending aorta. Sheath is exchanged for the 12 French sheath and 5000 units of heparin is given and the 16 French sheath is advanced up the wire on my side. Having marked from the initial injection of the approximate location of the renals, next the main body which is a 23 x 12 x 18 Gore Excluder is advanced up my side over the stiff wire. It is positioned at the approximate level of the renals and a KMP catheter from the left is used in a magnified view to identify the left renal, which is the lowest renal. The graft is then deployed down to expose the contralateral gate. The pigtail catheter is repositioned by Dr. Lucky Cowboy and Glidewire is advanced through the contralateral gate. The pigtail catheter is advanced over the wire,  twirled in the main body to demonstrate the proper placement and subsequently stiff angled Glidewire is placed after an RAO projection of the pelvis is obtained. A 14 x 12 contralateral limb is opened, prepped and then advanced over the wire and deployed. The remaining portion of the right ipsilateral limb is then deployed.   LAO projection is then obtained and a 12 x 7 extender cuff is deployed up the right side. The Coda balloon is then advanced up both sides and subsequently both limbs are angioplastied with 12 x 8 balloons to 6 atmospheres each.   Pigtail catheter is advanced up the right and delayed image is obtained. No endoleaks are identified with excellent forward flow.   The left sheath is then removed, and the Perclose device is secured. Hemostasis is achieved. In a similar fashion the right sheath is removed  and hemostasis achieved.   Both groin incisions are then closed with 4-0 Monocryl subcuticular and Dermabond.   INTERPRETATION: Initial views demonstrate a large iliac aneurysm that extends to the aorta, therefore no proximal seal zone is available and a true aortic endoprosthesis is required. The internal iliac is then accessed from the left and both dominant branches after the initial trunk are coiled with excellent result. Subsequently, the endograft is placed as described above and at the conclusion, there are no endoleaks.   SUMMARY: Successful treatment of very large right common iliac aneurysm using an aortoiliac  stent graft as described above.    ____________________________ Katha Cabal, MD ggs:LT D: 04/06/2014 16:05:57 ET T: 04/06/2014 19:50:27 ET JOB#: [id number]  cc: Katha Cabal, MD, <Dictator> Katha Cabal MD ELECTRONICALLY SIGNED 04/26/2014 12:59

## 2014-09-17 NOTE — Op Note (Signed)
PATIENT NAME:  Michelle Murillo, Michelle Murillo MR#:  [id number] DATE OF BIRTH:  1945/12/27  DATE OF PROCEDURE:  04/06/2014  SURGEONS:   1.  Algernon Huxley, MD  2.  Katha Cabal, MD  PREOPERATIVE DIAGNOSES: 1.  Large right iliac artery aneurysm involving the common iliac artery and the proximal portions of the hypogastric and external iliac artery.  2.  Chronic pulmonary disease.  3.  Bronchogenic carcinoid.  4.  Chronic kidney disease.  5.  Obesity.   POSTOPERATIVE DIAGNOSES: 1.  Large right iliac artery aneurysm involving the common iliac artery and the proximal portions of the hypogastric and external iliac artery.  2.  Chronic pulmonary disease.  3.  Bronchogenic carcinoid.  4.  Chronic kidney disease.  5.  Obesity.   PROCEDURES: 1.  Ultrasound guidance for vascular access to bilateral femoral arteries.  2.  Catheter placement to aorta from right femoral approach.  3.  Catheter placement into two separate right hypogastric artery branches from left femoral approach.  4.  Aortogram.  5.  Selective hypogastric artery branch arteriogram x 2.  6.  Coil embolization of two separate right hypogastric artery branches and the main hypogastric artery.  7.  Placement of a Gore Excluder endoprosthesis, 23 mm diameter main body, 14 mm diameter x 12 cm length contralateral left leg.  8.  Placement of a right iliac extender 12 mm diameter x 7 cm length.  9.  ProGlide closure device to bilateral femoral arteries.   ANESTHESIA:  General.   BLOOD LOSS:  50 mL.   FLUOROSCOPY TIME:  16 minutes.   CONTRAST:  90 mL.   INDICATION FOR PROCEDURE:  This is an individual referred to me for a very large right iliac artery aneurysm. This began just at the origin of the right common iliac artery, so repair of both the aorta and iliac segments will be necessary for complete exclusion. It also went down to the origins of the internal iliac artery and the external iliac artery, which require coil embolization of the  right internal iliac artery to avoid type II endoleak and extension of the stent graft down to the external iliac artery. She is brought in for repair of this. She is very high risk, but given the very large size of the aneurysm nearing 6 cm in the iliac artery, repair is necessary to avoid rupture. Risks and benefits were discussed. Informed consent was obtained.   DESCRIPTION OF PROCEDURE:  The patient was brought to the vascular suite. The abdomen and groin were sterilely prepped and draped, and a sterile surgical field was created. We ultrasounded both femoral arteries. Under direct ultrasound guidance, permanent image was recorded, and 5-French sheaths were placed. We then placed two ProGlide devices on each side in a Perclose fashion and then upsized to an 8-French sheath. At this point, the pigtail catheter was placed on the aorta on the right, and the aortogram was performed. This showed the expected configuration with the left renal artery being lower. There was a significant stenosis of the left renal artery that was not known prior to the procedure. The right iliac artery was largely aneurysmal. I then crossed the aortic bifurcation from the left femoral approach, got into the right hypogastric artery. There were two main hypogastric artery branches, and I elected to selectively cannulate both of these and selectively coil both of these, feeding back into the main hypogastric artery. The more medial and superior branch was cannulated first, and three 14 mm diameter  coils were then placed into this initial hypogastric artery branch, and selective imaging showed excellent placement. The second main hypogastric artery branch was then selectively cannulated, and imaging was performed. A 10 mm diameter and two 8 mm diameter coils were placed into the second hypogastric artery branch. I then deployed three 20 mm diameter coils into the main right internal iliac artery, back to its main segment. Imaging was  performed, which showed excellent placement of these coils. We then turned our attention to the aneurysm repair. The 18-French sheath was placed up the right and 12-French sheath was placed up the left. We selected a 23 mm diameter main body, 18 cm length device and deployed this just below the left renal artery. I selectively cannulated the contralateral gate without difficulty with a Kumpe catheter and Glidewire and confirmed successful cannulation by twirling a pigtail catheter in the main body. I then replaced the dilator and advanced the sheath into the contralateral leg. A 14 mm diameter x 12 cm length left iliac limb was placed to about 1 to 2 cm above the left hypogastric artery in excellent location. We completed the deployment of the main body, which was just to the origin of the right external iliac artery, so a right iliac extender was required. A 12 mm diameter x 7 cm length extender was placed on the right. All junction points and seal zones were ironed out with the compliant balloon. We treated the aortic bifurcation and the proximal iliac arteries with 12 mm diameter noncompliant balloons. Completion angiogram showed excellent location of the stent graft with brisk flow throughout the stent graft, and no type I, II, or III endoleaks were identified. At this point, we elected to terminate the procedure. The sheath was removed. The ProGlides were deployed with excellent hemostatic result. The patient tolerated the procedure well and was taken to the recovery room in stable condition.     ____________________________ Algernon Huxley, MD jsd:nb D: 04/06/2014 15:39:21 ET T: 04/06/2014 18:24:47 ET JOB#: [id number]  cc: Algernon Huxley, MD, <Dictator> Algernon Huxley MD ELECTRONICALLY SIGNED 04/11/2014 10:13

## 2014-09-20 DIAGNOSIS — I10 Essential (primary) hypertension: Secondary | ICD-10-CM | POA: Diagnosis not present

## 2014-09-20 DIAGNOSIS — M109 Gout, unspecified: Secondary | ICD-10-CM | POA: Diagnosis not present

## 2014-09-20 DIAGNOSIS — J449 Chronic obstructive pulmonary disease, unspecified: Secondary | ICD-10-CM | POA: Diagnosis not present

## 2014-09-20 DIAGNOSIS — E119 Type 2 diabetes mellitus without complications: Secondary | ICD-10-CM | POA: Diagnosis not present

## 2014-09-20 DIAGNOSIS — E669 Obesity, unspecified: Secondary | ICD-10-CM | POA: Diagnosis not present

## 2014-09-20 DIAGNOSIS — G4730 Sleep apnea, unspecified: Secondary | ICD-10-CM | POA: Diagnosis not present

## 2014-09-20 DIAGNOSIS — C3490 Malignant neoplasm of unspecified part of unspecified bronchus or lung: Secondary | ICD-10-CM | POA: Diagnosis not present

## 2014-09-20 DIAGNOSIS — H3530 Unspecified macular degeneration: Secondary | ICD-10-CM | POA: Diagnosis not present

## 2014-09-20 DIAGNOSIS — I2510 Atherosclerotic heart disease of native coronary artery without angina pectoris: Secondary | ICD-10-CM | POA: Diagnosis not present

## 2014-10-05 DIAGNOSIS — E119 Type 2 diabetes mellitus without complications: Secondary | ICD-10-CM | POA: Diagnosis not present

## 2014-10-05 DIAGNOSIS — M109 Gout, unspecified: Secondary | ICD-10-CM | POA: Diagnosis not present

## 2014-10-05 DIAGNOSIS — I10 Essential (primary) hypertension: Secondary | ICD-10-CM | POA: Diagnosis not present

## 2014-10-05 DIAGNOSIS — J449 Chronic obstructive pulmonary disease, unspecified: Secondary | ICD-10-CM | POA: Diagnosis not present

## 2014-10-05 DIAGNOSIS — I2510 Atherosclerotic heart disease of native coronary artery without angina pectoris: Secondary | ICD-10-CM | POA: Diagnosis not present

## 2014-10-05 DIAGNOSIS — C3490 Malignant neoplasm of unspecified part of unspecified bronchus or lung: Secondary | ICD-10-CM | POA: Diagnosis not present

## 2014-10-05 DIAGNOSIS — E669 Obesity, unspecified: Secondary | ICD-10-CM | POA: Diagnosis not present

## 2014-10-05 DIAGNOSIS — G4730 Sleep apnea, unspecified: Secondary | ICD-10-CM | POA: Diagnosis not present

## 2014-10-05 DIAGNOSIS — H3530 Unspecified macular degeneration: Secondary | ICD-10-CM | POA: Diagnosis not present

## 2014-10-17 DIAGNOSIS — E669 Obesity, unspecified: Secondary | ICD-10-CM | POA: Diagnosis not present

## 2014-10-17 DIAGNOSIS — J449 Chronic obstructive pulmonary disease, unspecified: Secondary | ICD-10-CM | POA: Diagnosis not present

## 2014-10-17 DIAGNOSIS — M109 Gout, unspecified: Secondary | ICD-10-CM | POA: Diagnosis not present

## 2014-10-17 DIAGNOSIS — E119 Type 2 diabetes mellitus without complications: Secondary | ICD-10-CM | POA: Diagnosis not present

## 2014-10-17 DIAGNOSIS — I2510 Atherosclerotic heart disease of native coronary artery without angina pectoris: Secondary | ICD-10-CM | POA: Diagnosis not present

## 2014-10-17 DIAGNOSIS — C3490 Malignant neoplasm of unspecified part of unspecified bronchus or lung: Secondary | ICD-10-CM | POA: Diagnosis not present

## 2014-10-17 DIAGNOSIS — I10 Essential (primary) hypertension: Secondary | ICD-10-CM | POA: Diagnosis not present

## 2014-10-17 DIAGNOSIS — H3530 Unspecified macular degeneration: Secondary | ICD-10-CM | POA: Diagnosis not present

## 2014-10-17 DIAGNOSIS — G4730 Sleep apnea, unspecified: Secondary | ICD-10-CM | POA: Diagnosis not present

## 2014-11-11 DIAGNOSIS — E1121 Type 2 diabetes mellitus with diabetic nephropathy: Secondary | ICD-10-CM | POA: Diagnosis not present

## 2014-11-11 DIAGNOSIS — I10 Essential (primary) hypertension: Secondary | ICD-10-CM | POA: Diagnosis not present

## 2014-11-11 DIAGNOSIS — M10071 Idiopathic gout, right ankle and foot: Secondary | ICD-10-CM | POA: Diagnosis not present

## 2014-11-11 DIAGNOSIS — N183 Chronic kidney disease, stage 3 (moderate): Secondary | ICD-10-CM | POA: Diagnosis not present

## 2014-11-11 DIAGNOSIS — I25810 Atherosclerosis of coronary artery bypass graft(s) without angina pectoris: Secondary | ICD-10-CM | POA: Diagnosis not present

## 2014-11-12 ENCOUNTER — Encounter: Payer: Self-pay | Admitting: Emergency Medicine

## 2014-11-12 ENCOUNTER — Emergency Department
Admission: EM | Admit: 2014-11-12 | Discharge: 2014-11-12 | Disposition: A | Discharge status: Home / Self Care | Payer: Commercial Managed Care - HMO | Attending: Emergency Medicine | Admitting: Emergency Medicine

## 2014-11-12 ENCOUNTER — Emergency Department: Payer: Commercial Managed Care - HMO

## 2014-11-12 DIAGNOSIS — I82431 Acute embolism and thrombosis of right popliteal vein: Secondary | ICD-10-CM | POA: Diagnosis not present

## 2014-11-12 DIAGNOSIS — Z720 Tobacco use: Secondary | ICD-10-CM | POA: Insufficient documentation

## 2014-11-12 DIAGNOSIS — Z7901 Long term (current) use of anticoagulants: Secondary | ICD-10-CM | POA: Diagnosis not present

## 2014-11-12 DIAGNOSIS — E119 Type 2 diabetes mellitus without complications: Secondary | ICD-10-CM | POA: Diagnosis not present

## 2014-11-12 DIAGNOSIS — R2241 Localized swelling, mass and lump, right lower limb: Secondary | ICD-10-CM | POA: Diagnosis present

## 2014-11-12 DIAGNOSIS — I824Z1 Acute embolism and thrombosis of unspecified deep veins of right distal lower extremity: Secondary | ICD-10-CM

## 2014-11-12 DIAGNOSIS — I82401 Acute embolism and thrombosis of unspecified deep veins of right lower extremity: Secondary | ICD-10-CM | POA: Diagnosis not present

## 2014-11-12 DIAGNOSIS — I82491 Acute embolism and thrombosis of other specified deep vein of right lower extremity: Secondary | ICD-10-CM | POA: Diagnosis not present

## 2014-11-12 HISTORY — DX: Essential (primary) hypertension: I10

## 2014-11-12 HISTORY — DX: Gout, unspecified: M10.9

## 2014-11-12 HISTORY — DX: Abdominal aortic aneurysm, without rupture: I71.4

## 2014-11-12 HISTORY — DX: Type 2 diabetes mellitus without complications: E11.9

## 2014-11-12 HISTORY — DX: Malignant (primary) neoplasm, unspecified: C80.1

## 2014-11-12 HISTORY — DX: Gastro-esophageal reflux disease without esophagitis: K21.9

## 2014-11-12 HISTORY — DX: Abdominal aortic aneurysm, without rupture, unspecified: I71.40

## 2014-11-12 LAB — BASIC METABOLIC PANEL
Anion gap: 1 — ABNORMAL LOW (ref 5–15)
BUN: 24 mg/dL — AB (ref 6–20)
CHLORIDE: 112 mmol/L — AB (ref 101–111)
CO2: 28 mmol/L (ref 22–32)
Calcium: 8.8 mg/dL — ABNORMAL LOW (ref 8.9–10.3)
Creatinine, Ser: 1.74 mg/dL — ABNORMAL HIGH (ref 0.44–1.00)
GFR calc non Af Amer: 29 mL/min — ABNORMAL LOW (ref >60)
GFR, EST AFRICAN AMERICAN: 33 mL/min — AB (ref >60)
Glucose, Bld: 93 mg/dL (ref 65–99)
POTASSIUM: 4.4 mmol/L (ref 3.5–5.1)
Sodium: 141 mmol/L (ref 135–145)

## 2014-11-12 LAB — PROTIME-INR
INR: 0.93
Prothrombin Time: 12.7 seconds (ref 11.4–15.0)

## 2014-11-12 LAB — CBC
HCT: 38.3 % (ref 35.0–47.0)
Hemoglobin: 12.3 g/dL (ref 12.0–16.0)
MCH: 28.7 pg (ref 26.0–34.0)
MCHC: 32 g/dL (ref 32.0–36.0)
MCV: 89.7 fL (ref 80.0–100.0)
Platelets: 135 10*3/uL — ABNORMAL LOW (ref 150–440)
RBC: 4.27 MIL/uL (ref 3.80–5.20)
RDW: 17.4 % — ABNORMAL HIGH (ref 11.5–14.5)
WBC: 4.5 10*3/uL (ref 3.6–11.0)

## 2014-11-12 MED ORDER — RIVAROXABAN (XARELTO) VTE STARTER PACK (15 & 20 MG): ORAL_TABLET | ORAL | Status: DC

## 2014-11-12 MED ORDER — RIVAROXABAN 15 MG PO TABS
15.0000 mg | ORAL_TABLET | Freq: Once | ORAL | Status: AC
  Administered 2014-11-12: 15 mg via ORAL
  Filled 2014-11-12: qty 1

## 2014-11-12 NOTE — ED Notes (Signed)
Pt reports pain and swelling to right lower extremity for the past couple of days. Pt reports warmth and swelling in right calf. Pt states I can feel a "knot behind my right knee" that she states she noticed last night.

## 2014-11-12 NOTE — Discharge Instructions (Signed)
Deep Vein Thrombosis It is very important that you stay well hydrated. Schedule a follow up appointment with your primary care provider. Return to the ER for symptoms that change or worsen or for new concerns.    A deep vein thrombosis (DVT) is a blood clot that develops in the deep, larger veins of the leg, arm, or pelvis. These are more dangerous than clots that might form in veins near the surface of the body. A DVT can lead to serious and even life-threatening complications if the clot breaks off and travels in the bloodstream to the lungs.  A DVT can damage the valves in your leg veins so that instead of flowing upward, the blood pools in the lower leg. This is called post-thrombotic syndrome, and it can result in pain, swelling, discoloration, and sores on the leg. CAUSES Usually, several things contribute to the formation of blood clots. Contributing factors include:  The flow of blood slows down.  The inside of the vein is damaged in some way.  You have a condition that makes blood clot more easily. RISK FACTORS Some people are more likely than others to develop blood clots. Risk factors include:   Smoking.  Being overweight (obese).  Sitting or lying still for a long time. This includes long-distance travel, paralysis, or recovery from an illness or surgery. Other factors that increase risk are:   Older age, especially over 70 years of age.  Having a family history of blood clots or if you have already had a blot clot.  Having major or lengthy surgery. This is especially true for surgery on the hip, knee, or belly (abdomen). Hip surgery is particularly high risk.  Having a long, thin tube (catheter) placed inside a vein during a medical procedure.  Breaking a hip or leg.  Having cancer or cancer treatment.  Pregnancy and childbirth.  Hormone changes make the blood clot more easily during pregnancy.  The fetus puts pressure on the veins of the pelvis.  There is a  risk of injury to veins during delivery or a caesarean delivery. The risk is highest just after childbirth.  Medicines containing the female hormone estrogen. This includes birth control pills and hormone replacement therapy.  Other circulation or heart problems.  SIGNS AND SYMPTOMS When a clot forms, it can either partially or totally block the blood flow in that vein. Symptoms of a DVT can include:  Swelling of the leg or arm, especially if one side is much worse.  Warmth and redness of the leg or arm, especially if one side is much worse.  Pain in an arm or leg. If the clot is in the leg, symptoms may be more noticeable or worse when standing or walking. The symptoms of a DVT that has traveled to the lungs (pulmonary embolism, PE) usually start suddenly and include:  Shortness of breath.  Coughing.  Coughing up blood or blood-tinged mucus.  Chest pain. The chest pain is often worse with deep breaths.  Rapid heartbeat. Anyone with these symptoms should get emergency medical treatment right away. Do not wait to see if the symptoms will go away. Call your local emergency services (911 in the [place] if you have these symptoms. Do not drive yourself to the hospital. DIAGNOSIS If a DVT is suspected, your health care provider will take a full medical history and perform a physical exam. Tests that also may be required include:  Blood tests, including studies of the clotting properties of the blood.  Ultrasound  to see if you have clots in your legs or lungs.  X-rays to show the flow of blood when dye is injected into the veins (venogram).  Studies of your lungs if you have any chest symptoms. PREVENTION  Exercise the legs regularly. Take a brisk 30-minute walk every day.  Maintain a weight that is appropriate for your height.  Avoid sitting or lying in bed for long periods of time without moving your legs.  Women, particularly those over the age of 69 years, should consider the  risks and benefits of taking estrogen medicines, including birth control pills.  Do not smoke, especially if you take estrogen medicines.  Long-distance travel can increase your risk of DVT. You should exercise your legs by walking or pumping the muscles every hour.  Many of the risk factors above relate to situations that exist with hospitalization, either for illness, injury, or elective surgery. Prevention may include medical and nonmedical measures.  Your health care provider will assess you for the need for venous thromboembolism prevention when you are admitted to the hospital. If you are having surgery, your surgeon will assess you the day of or day after surgery. TREATMENT Once identified, a DVT can be treated. It can also be prevented in some circumstances. Once you have had a DVT, you may be at increased risk for a DVT in the future. The most common treatment for DVT is blood-thinning (anticoagulant) medicine, which reduces the blood's tendency to clot. Anticoagulants can stop new blood clots from forming and stop old clots from growing. They cannot dissolve existing clots. Your body does this by itself over time. Anticoagulants can be given by mouth, through an IV tube, or by injection. Your health care provider will determine the best program for you. Other medicines or treatments that may be used are:  Heparin or related medicines (low molecular weight heparin) are often the first treatment for a blood clot. They act quickly. However, they cannot be taken orally and must be given either in shot form or by IV tube.  Heparin can cause a fall in a component of blood that stops bleeding and forms blood clots (platelets). You will be monitored with blood tests to be sure this does not occur.  Warfarin is an anticoagulant that can be swallowed. It takes a few days to start working, so usually heparin or related medicines are used in combination. Once warfarin is working, heparin is usually  stopped.  Factor Xa inhibitor medicines, such as rivaroxaban and apixaban, also reduce blood clotting. These medicines are taken orally and can often be used without heparin or related medicines.  Less commonly, clot dissolving drugs (thrombolytics) are used to dissolve a DVT. They carry a high risk of bleeding, so they are used mainly in severe cases where your life or a part of your body is threatened.  Very rarely, a blood clot in the leg needs to be removed surgically.  If you are unable to take anticoagulants, your health care provider may arrange for you to have a filter placed in a main vein in your abdomen. This filter prevents clots from traveling to your lungs. HOME CARE INSTRUCTIONS  Take all medicines as directed by your health care provider.  Learn as much as you can about DVT.  Wear a medical alert bracelet or carry a medical alert card.  Ask your health care provider how soon you can go back to normal activities. It is important to stay active to prevent blood clots. If  you are on anticoagulant medicine, avoid contact sports.  It is very important to exercise. This is especially important while traveling, sitting, or standing for long periods of time. Exercise your legs by walking or by tightening and relaxing your leg muscles regularly. Take frequent walks.  You may need to wear compression stockings. These are tight elastic stockings that apply pressure to the lower legs. This pressure can help keep the blood in the legs from clotting. Taking Warfarin Warfarin is a daily medicine that is taken by mouth. Your health care provider will advise you on the length of treatment (usually 3-6 months, sometimes lifelong). If you take warfarin:  Understand how to take warfarin and foods that can affect how warfarin works in Veterinary surgeon.  Too much and too little warfarin are both dangerous. Too much warfarin increases the risk of bleeding. Too little warfarin continues to allow the risk  for blood clots. Warfarin and Regular Blood Testing While taking warfarin, you will need to have regular blood tests to measure your blood clotting time. These blood tests usually include both the prothrombin time (PT) and international normalized ratio (INR) tests. The PT and INR results allow your health care provider to adjust your dose of warfarin. It is very important that you have your PT and INR tested as often as directed by your health care provider.  Warfarin and Your Diet Avoid major changes in your diet, or notify your health care provider before changing your diet. Arrange a visit with a registered dietitian to answer your questions. Many foods, especially foods high in vitamin K, can interfere with warfarin and affect the PT and INR results. You should eat a consistent amount of foods high in vitamin K. Foods high in vitamin K include:   Spinach, kale, broccoli, cabbage, collard and turnip greens, Brussels sprouts, peas, cauliflower, seaweed, and parsley.  Beef and pork liver.  Green tea.  Soybean oil. Warfarin with Other Medicines Many medicines can interfere with warfarin and affect the PT and INR results. You must:  Tell your health care provider about any and all medicines, vitamins, and supplements you take, including aspirin and other over-the-counter anti-inflammatory medicines. Be especially cautious with aspirin and anti-inflammatory medicines. Ask your health care provider before taking these.  Do not take or discontinue any prescribed or over-the-counter medicine except on the advice of your health care provider or pharmacist. Warfarin Side Effects Warfarin can have side effects, such as easy bruising and difficulty stopping bleeding. Ask your health care provider or pharmacist about other side effects of warfarin. You will need to:  Hold pressure over cuts for longer than usual.  Notify your dentist and other health care providers that you are taking warfarin  before you undergo any procedures where bleeding may occur. Warfarin with Alcohol and Tobacco   Drinking alcohol frequently can increase the effect of warfarin, leading to excess bleeding. It is best to avoid alcoholic drinks or to consume only very small amounts while taking warfarin. Notify your health care provider if you change your alcohol intake.   Do not use any tobacco products including cigarettes, chewing tobacco, or electronic cigarettes. If you smoke, quit. Ask your health care provider for help with quitting smoking. Alternative Medicines to Warfarin: Factor Xa Inhibitor Medicines  These blood-thinning medicines are taken by mouth, usually for several weeks or longer. It is important to take the medicine every single day at the same time each day.  There are no regular blood tests required when  using these medicines.  There are fewer food and drug interactions than with warfarin.  The side effects of this class of medicine are similar to those of warfarin, including excessive bruising or bleeding. Ask your health care provider or pharmacist about other potential side effects. SEEK MEDICAL CARE IF:  You notice a rapid heartbeat.  You feel weaker or more tired than usual.  You feel faint.  You notice increased bruising.  You feel your symptoms are not getting better in the time expected.  You believe you are having side effects of medicine. SEEK IMMEDIATE MEDICAL CARE IF:  You have chest pain.  You have trouble breathing.  You have new or increased swelling or pain in one leg.  You cough up blood.  You notice blood in vomit, in a bowel movement, or in urine. MAKE SURE YOU:  Understand these instructions.  Will watch your condition.  Will get help right away if you are not doing well or get worse. Document Released: 05/13/2005 Document Revised: 09/27/2013 Document Reviewed: 01/18/2013 [facility] Patient Information 2015 [place], [place]. This information is not  intended to replace advice given to you by your health care provider. Make sure you discuss any questions you have with your health care provider.

## 2014-11-12 NOTE — ED Provider Notes (Signed)
[facility] Emergency Department Provider Note ____________________________________________  Time seen: Approximately 4:10 PM  I have reviewed the triage vital signs and the nursing notes.   HISTORY  Chief Complaint Leg Swelling    HPI Michelle Murillo is a 69 y.o. female who presents to the emergency department for swelling to the right lower extremity for the past 2 days. She also reports pain in the right calf and behind the right knee.   Past Medical History  Diagnosis Date  . Diabetes mellitus without complication   . Hypertension   . Gout   . Cancer   . Abdominal aneurysm   . GERD (gastroesophageal reflux disease)     There are no active problems to display for this patient.   Past Surgical History  Procedure Laterality Date  . Cardiac surgery    . Abdominal surgery    . Bronchoscopy      Current Outpatient Rx  Name  Route  Sig  Dispense  Refill  . Rivaroxaban (XARELTO STARTER PACK) 15 & 20 MG TBPK      Take as directed on package: Start with one '15mg'$  tablet by mouth twice a day with food. On Day 22, switch to one '20mg'$  tablet once a day with food.   51 each   0     Allergies Morphine and related  No family history on file.  Social History History  Substance Use Topics  . Smoking status: Current Every Day Smoker    Types: Cigarettes  . Smokeless tobacco: Not on file  . Alcohol Use: No    Review of Systems Constitutional: No fever/chills Eyes: No visual changes. ENT: No sore throat. Cardiovascular: Denies chest pain. Respiratory: Denies shortness of breath. Gastrointestinal: No abdominal pain.  No nausea, no vomiting.  No diarrhea.  No constipation. Genitourinary: Negative for dysuria. Musculoskeletal: Negative for back pain. Skin: Negative for rash. Neurological: Negative for headaches, focal weakness or numbness.  10-point ROS otherwise negative.  ____________________________________________   PHYSICAL  EXAM:  VITAL SIGNS: ED Triage Vitals  Enc Vitals Group     BP 11/12/14 1324 119/88 mmHg     Pulse Rate 11/12/14 1324 72     Resp 11/12/14 1324 18     Temp 11/12/14 1324 98.7 F (37.1 C)     Temp Source 11/12/14 1324 Oral     SpO2 11/12/14 1324 98 %     Weight 11/12/14 1324 223 lb (101.152 kg)     Height 11/12/14 1324 '5\' 5"'$  (1.651 m)     Head Cir --      Peak Flow --      Pain Score 11/12/14 1328 8     Pain Loc --      Pain Edu? --      Excl. in [place]? --     Constitutional: Alert and oriented. Well appearing and in no acute distress. Eyes: Conjunctivae are normal. PERRL. EOMI. Head: Atraumatic. Nose: No congestion/rhinnorhea. Mouth/Throat: Mucous membranes are moist.  Oropharynx non-erythematous. Neck: No stridor.   Cardiovascular: Normal rate, regular rhythm. Grossly normal heart sounds.  Good peripheral circulation. Respiratory: Normal respiratory effort.  No retractions. Lungs CTAB. Gastrointestinal: Soft and nontender. No distention. No abdominal bruits. No CVA tenderness. Musculoskeletal: Right lower extremity--tender to the touch. Palpable mass present, right knee. Neurologic:  Normal speech and language. No gross focal neurologic deficits are appreciated. Speech is normal. No gait instability. Skin:  Skin is warm, dry and intact. No rash noted. Psychiatric: Mood and  affect are normal. Speech and behavior are normal.  ____________________________________________   LABS (all labs ordered are listed, but only abnormal results are displayed)  Labs Reviewed  BASIC METABOLIC PANEL - Abnormal; Notable for the following:    Chloride 112 (*)    BUN 24 (*)    Creatinine, Ser 1.74 (*)    Calcium 8.8 (*)    GFR calc non Af Amer 29 (*)    GFR calc Af Amer 33 (*)    Anion gap 1 (*)    All other components within normal limits  CBC - Abnormal; Notable for the following:    RDW 17.4 (*)    Platelets 135 (*)    All other components within normal limits  PROTIME-INR    ____________________________________________  EKG   ____________________________________________  RADIOLOGY  Positive for popliteal DVT on the right lower extremity. ____________________________________________   PROCEDURES  Procedure(s) performed: None  Critical Care performed: No  ____________________________________________   INITIAL IMPRESSION / ASSESSMENT AND PLAN / ED COURSE  Pertinent labs & imaging results that were available during my care of the patient were reviewed by me and considered in my medical decision making (see chart for details).  Patient to be given Xarelto starting today. Starter pack ordered. Patient verbalizes concern about the ability to afford her medications. She was given a manufacturer's coupon for a free 30 day supply. She was advised to follow-up with her primary care provider. She is to call tomorrow to schedule an appointment. She was advised to return to emergency department for any pain or shortness of breath. ____________________________________________   FINAL CLINICAL IMPRESSION(S) / ED DIAGNOSES  Final diagnoses:  Deep vein thrombosis of calf, right      Victorino Dike, FNP 11/12/14 [place], MD 11/13/14 0007

## 2014-11-17 DIAGNOSIS — I82491 Acute embolism and thrombosis of other specified deep vein of right lower extremity: Secondary | ICD-10-CM | POA: Diagnosis not present

## 2014-11-17 DIAGNOSIS — M170 Bilateral primary osteoarthritis of knee: Secondary | ICD-10-CM | POA: Diagnosis not present

## 2014-11-17 DIAGNOSIS — E1121 Type 2 diabetes mellitus with diabetic nephropathy: Secondary | ICD-10-CM | POA: Diagnosis not present

## 2014-11-17 DIAGNOSIS — I25810 Atherosclerosis of coronary artery bypass graft(s) without angina pectoris: Secondary | ICD-10-CM | POA: Diagnosis not present

## 2014-11-23 DIAGNOSIS — J449 Chronic obstructive pulmonary disease, unspecified: Secondary | ICD-10-CM | POA: Diagnosis not present

## 2014-11-23 DIAGNOSIS — G4730 Sleep apnea, unspecified: Secondary | ICD-10-CM | POA: Diagnosis not present

## 2014-11-23 DIAGNOSIS — I2510 Atherosclerotic heart disease of native coronary artery without angina pectoris: Secondary | ICD-10-CM | POA: Diagnosis not present

## 2014-11-23 DIAGNOSIS — H3530 Unspecified macular degeneration: Secondary | ICD-10-CM | POA: Diagnosis not present

## 2014-11-23 DIAGNOSIS — M109 Gout, unspecified: Secondary | ICD-10-CM | POA: Diagnosis not present

## 2014-11-23 DIAGNOSIS — C3490 Malignant neoplasm of unspecified part of unspecified bronchus or lung: Secondary | ICD-10-CM | POA: Diagnosis not present

## 2014-11-23 DIAGNOSIS — E669 Obesity, unspecified: Secondary | ICD-10-CM | POA: Diagnosis not present

## 2014-11-23 DIAGNOSIS — E119 Type 2 diabetes mellitus without complications: Secondary | ICD-10-CM | POA: Diagnosis not present

## 2014-11-23 DIAGNOSIS — I10 Essential (primary) hypertension: Secondary | ICD-10-CM | POA: Diagnosis not present

## 2014-11-27 ENCOUNTER — Emergency Department: Payer: No Typology Code available for payment source

## 2014-11-27 ENCOUNTER — Emergency Department
Admission: EM | Admit: 2014-11-27 | Discharge: 2014-11-27 | Disposition: A | Discharge status: Home / Self Care | Payer: No Typology Code available for payment source | Attending: Emergency Medicine | Admitting: Emergency Medicine

## 2014-11-27 ENCOUNTER — Other Ambulatory Visit: Payer: Self-pay

## 2014-11-27 ENCOUNTER — Encounter: Payer: Self-pay | Admitting: *Deleted

## 2014-11-27 DIAGNOSIS — Y998 Other external cause status: Secondary | ICD-10-CM | POA: Diagnosis not present

## 2014-11-27 DIAGNOSIS — S3991XA Unspecified injury of abdomen, initial encounter: Secondary | ICD-10-CM | POA: Diagnosis not present

## 2014-11-27 DIAGNOSIS — S3992XA Unspecified injury of lower back, initial encounter: Secondary | ICD-10-CM | POA: Insufficient documentation

## 2014-11-27 DIAGNOSIS — M542 Cervicalgia: Secondary | ICD-10-CM | POA: Diagnosis not present

## 2014-11-27 DIAGNOSIS — S1093XA Contusion of unspecified part of neck, initial encounter: Secondary | ICD-10-CM | POA: Diagnosis not present

## 2014-11-27 DIAGNOSIS — S0990XA Unspecified injury of head, initial encounter: Secondary | ICD-10-CM | POA: Diagnosis not present

## 2014-11-27 DIAGNOSIS — G8929 Other chronic pain: Secondary | ICD-10-CM | POA: Diagnosis not present

## 2014-11-27 DIAGNOSIS — S3993XA Unspecified injury of pelvis, initial encounter: Secondary | ICD-10-CM | POA: Diagnosis not present

## 2014-11-27 DIAGNOSIS — R1010 Upper abdominal pain, unspecified: Secondary | ICD-10-CM | POA: Diagnosis not present

## 2014-11-27 DIAGNOSIS — Z7901 Long term (current) use of anticoagulants: Secondary | ICD-10-CM | POA: Insufficient documentation

## 2014-11-27 DIAGNOSIS — Y9389 Activity, other specified: Secondary | ICD-10-CM | POA: Diagnosis not present

## 2014-11-27 DIAGNOSIS — R51 Headache: Secondary | ICD-10-CM | POA: Diagnosis not present

## 2014-11-27 DIAGNOSIS — R0789 Other chest pain: Secondary | ICD-10-CM | POA: Diagnosis not present

## 2014-11-27 DIAGNOSIS — S8991XA Unspecified injury of right lower leg, initial encounter: Secondary | ICD-10-CM | POA: Insufficient documentation

## 2014-11-27 DIAGNOSIS — J441 Chronic obstructive pulmonary disease with (acute) exacerbation: Secondary | ICD-10-CM | POA: Insufficient documentation

## 2014-11-27 DIAGNOSIS — T148 Other injury of unspecified body region: Secondary | ICD-10-CM | POA: Diagnosis not present

## 2014-11-27 DIAGNOSIS — Y92410 Unspecified street and highway as the place of occurrence of the external cause: Secondary | ICD-10-CM | POA: Diagnosis not present

## 2014-11-27 DIAGNOSIS — E119 Type 2 diabetes mellitus without complications: Secondary | ICD-10-CM | POA: Diagnosis not present

## 2014-11-27 DIAGNOSIS — T148XXA Other injury of unspecified body region, initial encounter: Secondary | ICD-10-CM

## 2014-11-27 DIAGNOSIS — S199XXA Unspecified injury of neck, initial encounter: Secondary | ICD-10-CM | POA: Diagnosis not present

## 2014-11-27 DIAGNOSIS — R079 Chest pain, unspecified: Secondary | ICD-10-CM | POA: Diagnosis not present

## 2014-11-27 DIAGNOSIS — Z720 Tobacco use: Secondary | ICD-10-CM | POA: Diagnosis not present

## 2014-11-27 DIAGNOSIS — S299XXA Unspecified injury of thorax, initial encounter: Secondary | ICD-10-CM | POA: Diagnosis not present

## 2014-11-27 DIAGNOSIS — S301XXA Contusion of abdominal wall, initial encounter: Secondary | ICD-10-CM | POA: Diagnosis not present

## 2014-11-27 HISTORY — DX: Chronic obstructive pulmonary disease, unspecified: J44.9

## 2014-11-27 HISTORY — DX: Heart failure, unspecified: I50.9

## 2014-11-27 LAB — CBC WITH DIFFERENTIAL/PLATELET
BASOS ABS: 0 10*3/uL (ref 0–0.1)
BASOS PCT: 1 %
EOS ABS: 0.1 10*3/uL (ref 0–0.7)
EOS PCT: 1 %
HCT: 38.1 % (ref 35.0–47.0)
Hemoglobin: 12.2 g/dL (ref 12.0–16.0)
LYMPHS ABS: 1 10*3/uL (ref 1.0–3.6)
Lymphocytes Relative: 17 %
MCH: 29 pg (ref 26.0–34.0)
MCHC: 32 g/dL (ref 32.0–36.0)
MCV: 90.5 fL (ref 80.0–100.0)
MONOS PCT: 7 %
Monocytes Absolute: 0.4 10*3/uL (ref 0.2–0.9)
Neutro Abs: 4.4 10*3/uL (ref 1.4–6.5)
Neutrophils Relative %: 74 %
PLATELETS: 134 10*3/uL — AB (ref 150–440)
RBC: 4.21 MIL/uL (ref 3.80–5.20)
RDW: 17.7 % — AB (ref 11.5–14.5)
WBC: 5.9 10*3/uL (ref 3.6–11.0)

## 2014-11-27 LAB — TYPE AND SCREEN
ABO/RH(D): A NEG
Antibody Screen: NEGATIVE

## 2014-11-27 LAB — COMPREHENSIVE METABOLIC PANEL
ALK PHOS: 76 U/L (ref 38–126)
ALT: 15 U/L (ref 14–54)
AST: 19 U/L (ref 15–41)
Albumin: 4 g/dL (ref 3.5–5.0)
Anion gap: 5 (ref 5–15)
BILIRUBIN TOTAL: 0.4 mg/dL (ref 0.3–1.2)
BUN: 35 mg/dL — AB (ref 6–20)
CO2: 26 mmol/L (ref 22–32)
Calcium: 9 mg/dL (ref 8.9–10.3)
Chloride: 111 mmol/L (ref 101–111)
Creatinine, Ser: 1.83 mg/dL — ABNORMAL HIGH (ref 0.44–1.00)
GFR calc non Af Amer: 27 mL/min — ABNORMAL LOW (ref >60)
GFR, EST AFRICAN AMERICAN: 31 mL/min — AB (ref >60)
Glucose, Bld: 125 mg/dL — ABNORMAL HIGH (ref 65–99)
Potassium: 4.5 mmol/L (ref 3.5–5.1)
Sodium: 142 mmol/L (ref 135–145)
Total Protein: 7.1 g/dL (ref 6.5–8.1)

## 2014-11-27 LAB — LIPASE, BLOOD: LIPASE: 58 U/L — AB (ref 22–51)

## 2014-11-27 LAB — ABO/RH: ABO/RH(D): A NEG

## 2014-11-27 MED ORDER — ONDANSETRON HCL 4 MG/2ML IJ SOLN
INTRAMUSCULAR | Status: AC
  Administered 2014-11-27: 4 mg via INTRAVENOUS
  Filled 2014-11-27: qty 2

## 2014-11-27 MED ORDER — HYDROMORPHONE HCL 1 MG/ML IJ SOLN
INTRAMUSCULAR | Status: AC
  Administered 2014-11-27: 0.5 mg via INTRAVENOUS
  Filled 2014-11-27: qty 1

## 2014-11-27 MED ORDER — HYDROMORPHONE HCL 1 MG/ML IJ SOLN
0.5000 mg | Freq: Once | INTRAMUSCULAR | Status: AC
  Administered 2014-11-27: 0.5 mg via INTRAVENOUS

## 2014-11-27 MED ORDER — ONDANSETRON HCL 4 MG/2ML IJ SOLN
4.0000 mg | Freq: Once | INTRAMUSCULAR | Status: AC
  Administered 2014-11-27: 4 mg via INTRAVENOUS

## 2014-11-27 NOTE — Discharge Instructions (Signed)
Contusion A contusion is a deep bruise. Contusions happen when an injury causes bleeding under the skin. Signs of bruising include pain, puffiness (swelling), and discolored skin. The contusion may turn blue, purple, or yellow. HOME CARE   Put ice on the injured area.  Put ice in a plastic bag.  Place a towel between your skin and the bag.  Leave the ice on for 15-20 minutes, 03-04 times a day.  Only take medicine as told by your doctor.  Rest the injured area.  If possible, raise (elevate) the injured area to lessen puffiness. GET HELP RIGHT AWAY IF:   You have more bruising or puffiness.  You have pain that is getting worse.  Your puffiness or pain is not helped by medicine. MAKE SURE YOU:   Understand these instructions.  Will watch your condition.  Will get help right away if you are not doing well or get worse. Document Released: 10/30/2007 Document Revised: 08/05/2011 Document Reviewed: 03/18/2011 [facility] Patient Information 2015 [place], [place]. This information is not intended to replace advice given to you by your health care provider. Make sure you discuss any questions you have with your health care provider.   Use tylenol as needed for the aches and pains.  Be sure to follow up with your cancer doctor at [facility] to check on that "ill defined density" that showed up an your chest CT. I would call him on Tuesday.
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
## 2014-11-28 ENCOUNTER — Telehealth (HOSPITAL_COMMUNITY): Payer: Self-pay

## 2014-11-28 NOTE — ED Notes (Signed)
Unable to contact pt by mail or telephone. Unable to communicate lab results or treatment changes. 

## 2014-12-01 DIAGNOSIS — Z043 Encounter for examination and observation following other accident: Secondary | ICD-10-CM | POA: Diagnosis not present

## 2014-12-01 DIAGNOSIS — M542 Cervicalgia: Secondary | ICD-10-CM | POA: Diagnosis not present

## 2014-12-01 DIAGNOSIS — J418 Mixed simple and mucopurulent chronic bronchitis: Secondary | ICD-10-CM | POA: Diagnosis not present

## 2014-12-01 DIAGNOSIS — R0789 Other chest pain: Secondary | ICD-10-CM | POA: Diagnosis not present

## 2014-12-07 DIAGNOSIS — D3A090 Benign carcinoid tumor of the bronchus and lung: Secondary | ICD-10-CM | POA: Diagnosis not present

## 2014-12-07 DIAGNOSIS — Z720 Tobacco use: Secondary | ICD-10-CM | POA: Diagnosis not present

## 2014-12-07 DIAGNOSIS — R911 Solitary pulmonary nodule: Secondary | ICD-10-CM | POA: Diagnosis not present

## 2014-12-07 DIAGNOSIS — J432 Centrilobular emphysema: Secondary | ICD-10-CM | POA: Diagnosis not present

## 2015-01-03 DIAGNOSIS — I2510 Atherosclerotic heart disease of native coronary artery without angina pectoris: Secondary | ICD-10-CM | POA: Diagnosis not present

## 2015-01-03 DIAGNOSIS — E669 Obesity, unspecified: Secondary | ICD-10-CM | POA: Diagnosis not present

## 2015-01-03 DIAGNOSIS — G4730 Sleep apnea, unspecified: Secondary | ICD-10-CM | POA: Diagnosis not present

## 2015-01-03 DIAGNOSIS — M109 Gout, unspecified: Secondary | ICD-10-CM | POA: Diagnosis not present

## 2015-01-03 DIAGNOSIS — C3490 Malignant neoplasm of unspecified part of unspecified bronchus or lung: Secondary | ICD-10-CM | POA: Diagnosis not present

## 2015-01-03 DIAGNOSIS — H3530 Unspecified macular degeneration: Secondary | ICD-10-CM | POA: Diagnosis not present

## 2015-01-03 DIAGNOSIS — J449 Chronic obstructive pulmonary disease, unspecified: Secondary | ICD-10-CM | POA: Diagnosis not present

## 2015-01-03 DIAGNOSIS — E119 Type 2 diabetes mellitus without complications: Secondary | ICD-10-CM | POA: Diagnosis not present

## 2015-01-03 DIAGNOSIS — I10 Essential (primary) hypertension: Secondary | ICD-10-CM | POA: Diagnosis not present

## 2015-01-25 DIAGNOSIS — E119 Type 2 diabetes mellitus without complications: Secondary | ICD-10-CM | POA: Diagnosis not present

## 2015-01-25 DIAGNOSIS — J449 Chronic obstructive pulmonary disease, unspecified: Secondary | ICD-10-CM | POA: Diagnosis not present

## 2015-01-25 DIAGNOSIS — C3490 Malignant neoplasm of unspecified part of unspecified bronchus or lung: Secondary | ICD-10-CM | POA: Diagnosis not present

## 2015-01-25 DIAGNOSIS — M109 Gout, unspecified: Secondary | ICD-10-CM | POA: Diagnosis not present

## 2015-01-31 DIAGNOSIS — I723 Aneurysm of iliac artery: Secondary | ICD-10-CM | POA: Diagnosis not present

## 2015-01-31 DIAGNOSIS — I129 Hypertensive chronic kidney disease with stage 1 through stage 4 chronic kidney disease, or unspecified chronic kidney disease: Secondary | ICD-10-CM | POA: Diagnosis not present

## 2015-01-31 DIAGNOSIS — F17200 Nicotine dependence, unspecified, uncomplicated: Secondary | ICD-10-CM | POA: Diagnosis not present

## 2015-01-31 DIAGNOSIS — I714 Abdominal aortic aneurysm, without rupture: Secondary | ICD-10-CM | POA: Diagnosis not present

## 2015-01-31 DIAGNOSIS — I10 Essential (primary) hypertension: Secondary | ICD-10-CM | POA: Diagnosis not present

## 2015-01-31 DIAGNOSIS — I739 Peripheral vascular disease, unspecified: Secondary | ICD-10-CM | POA: Diagnosis not present

## 2015-02-23 ENCOUNTER — Other Ambulatory Visit: Payer: Self-pay | Admitting: Internal Medicine

## 2015-02-23 DIAGNOSIS — D3A090 Benign carcinoid tumor of the bronchus and lung: Secondary | ICD-10-CM

## 2015-02-27 ENCOUNTER — Ambulatory Visit
Admission: RE | Admit: 2015-02-27 | Discharge: 2015-02-27 | Disposition: A | Discharge status: Home / Self Care | Payer: Commercial Managed Care - HMO | Source: Ambulatory Visit | Attending: Internal Medicine | Admitting: Internal Medicine

## 2015-02-27 DIAGNOSIS — Z951 Presence of aortocoronary bypass graft: Secondary | ICD-10-CM | POA: Insufficient documentation

## 2015-02-27 DIAGNOSIS — D3A090 Benign carcinoid tumor of the bronchus and lung: Secondary | ICD-10-CM | POA: Diagnosis not present

## 2015-02-27 DIAGNOSIS — I7090 Unspecified atherosclerosis: Secondary | ICD-10-CM | POA: Insufficient documentation

## 2015-03-08 DIAGNOSIS — J432 Centrilobular emphysema: Secondary | ICD-10-CM | POA: Diagnosis not present

## 2015-03-08 DIAGNOSIS — R911 Solitary pulmonary nodule: Secondary | ICD-10-CM | POA: Diagnosis not present

## 2015-03-08 DIAGNOSIS — J449 Chronic obstructive pulmonary disease, unspecified: Secondary | ICD-10-CM | POA: Diagnosis not present

## 2015-03-08 DIAGNOSIS — R918 Other nonspecific abnormal finding of lung field: Secondary | ICD-10-CM | POA: Diagnosis not present

## 2015-03-08 DIAGNOSIS — Z716 Tobacco abuse counseling: Secondary | ICD-10-CM | POA: Diagnosis not present

## 2015-03-08 DIAGNOSIS — Z951 Presence of aortocoronary bypass graft: Secondary | ICD-10-CM | POA: Diagnosis not present

## 2015-03-12 DIAGNOSIS — Z951 Presence of aortocoronary bypass graft: Secondary | ICD-10-CM | POA: Diagnosis not present

## 2015-03-12 DIAGNOSIS — R911 Solitary pulmonary nodule: Secondary | ICD-10-CM | POA: Diagnosis not present

## 2015-03-12 DIAGNOSIS — I517 Cardiomegaly: Secondary | ICD-10-CM | POA: Diagnosis not present

## 2015-03-20 DIAGNOSIS — I10 Essential (primary) hypertension: Secondary | ICD-10-CM | POA: Diagnosis not present

## 2015-03-20 DIAGNOSIS — Z23 Encounter for immunization: Secondary | ICD-10-CM | POA: Diagnosis not present

## 2015-03-20 DIAGNOSIS — M170 Bilateral primary osteoarthritis of knee: Secondary | ICD-10-CM | POA: Diagnosis not present

## 2015-03-20 DIAGNOSIS — E1121 Type 2 diabetes mellitus with diabetic nephropathy: Secondary | ICD-10-CM | POA: Diagnosis not present

## 2015-03-20 DIAGNOSIS — G4733 Obstructive sleep apnea (adult) (pediatric): Secondary | ICD-10-CM | POA: Diagnosis not present

## 2015-03-20 DIAGNOSIS — I25810 Atherosclerosis of coronary artery bypass graft(s) without angina pectoris: Secondary | ICD-10-CM | POA: Diagnosis not present

## 2015-03-20 DIAGNOSIS — J432 Centrilobular emphysema: Secondary | ICD-10-CM | POA: Diagnosis not present

## 2015-03-20 DIAGNOSIS — Z720 Tobacco use: Secondary | ICD-10-CM | POA: Diagnosis not present

## 2015-03-24 ENCOUNTER — Other Ambulatory Visit: Payer: Self-pay | Admitting: Internal Medicine

## 2015-03-24 DIAGNOSIS — Z1231 Encounter for screening mammogram for malignant neoplasm of breast: Secondary | ICD-10-CM

## 2015-04-07 ENCOUNTER — Ambulatory Visit: Payer: Commercial Managed Care - HMO | Attending: Internal Medicine

## 2015-04-28 DIAGNOSIS — R030 Elevated blood-pressure reading, without diagnosis of hypertension: Secondary | ICD-10-CM | POA: Diagnosis not present

## 2015-04-28 DIAGNOSIS — K219 Gastro-esophageal reflux disease without esophagitis: Secondary | ICD-10-CM | POA: Diagnosis not present

## 2015-04-28 DIAGNOSIS — H540 Blindness, both eyes: Secondary | ICD-10-CM | POA: Diagnosis not present

## 2015-04-28 DIAGNOSIS — G2581 Restless legs syndrome: Secondary | ICD-10-CM | POA: Diagnosis not present

## 2015-04-28 DIAGNOSIS — I509 Heart failure, unspecified: Secondary | ICD-10-CM | POA: Diagnosis not present

## 2015-04-28 DIAGNOSIS — Z0000 Encounter for general adult medical examination without abnormal findings: Secondary | ICD-10-CM | POA: Diagnosis not present

## 2015-04-28 DIAGNOSIS — M109 Gout, unspecified: Secondary | ICD-10-CM | POA: Diagnosis not present

## 2015-05-28 HISTORY — PX: ABDOMINAL SURGERY: SHX537

## 2015-08-09 DIAGNOSIS — I25810 Atherosclerosis of coronary artery bypass graft(s) without angina pectoris: Secondary | ICD-10-CM | POA: Diagnosis not present

## 2015-08-09 DIAGNOSIS — I10 Essential (primary) hypertension: Secondary | ICD-10-CM | POA: Diagnosis not present

## 2015-08-09 DIAGNOSIS — Z720 Tobacco use: Secondary | ICD-10-CM | POA: Diagnosis not present

## 2015-08-09 DIAGNOSIS — G4733 Obstructive sleep apnea (adult) (pediatric): Secondary | ICD-10-CM | POA: Diagnosis not present

## 2015-08-09 DIAGNOSIS — M170 Bilateral primary osteoarthritis of knee: Secondary | ICD-10-CM | POA: Diagnosis not present

## 2015-08-09 DIAGNOSIS — E1121 Type 2 diabetes mellitus with diabetic nephropathy: Secondary | ICD-10-CM | POA: Diagnosis not present

## 2015-08-09 DIAGNOSIS — J432 Centrilobular emphysema: Secondary | ICD-10-CM | POA: Diagnosis not present

## 2015-08-16 DIAGNOSIS — I25810 Atherosclerosis of coronary artery bypass graft(s) without angina pectoris: Secondary | ICD-10-CM | POA: Diagnosis not present

## 2015-08-16 DIAGNOSIS — I10 Essential (primary) hypertension: Secondary | ICD-10-CM | POA: Diagnosis not present

## 2015-08-16 DIAGNOSIS — Z1239 Encounter for other screening for malignant neoplasm of breast: Secondary | ICD-10-CM | POA: Diagnosis not present

## 2015-08-16 DIAGNOSIS — D3A090 Benign carcinoid tumor of the bronchus and lung: Secondary | ICD-10-CM | POA: Diagnosis not present

## 2015-08-16 DIAGNOSIS — J432 Centrilobular emphysema: Secondary | ICD-10-CM | POA: Diagnosis not present

## 2015-08-16 DIAGNOSIS — M170 Bilateral primary osteoarthritis of knee: Secondary | ICD-10-CM | POA: Diagnosis not present

## 2015-08-16 DIAGNOSIS — I480 Paroxysmal atrial fibrillation: Secondary | ICD-10-CM | POA: Diagnosis not present

## 2015-08-16 DIAGNOSIS — Z0000 Encounter for general adult medical examination without abnormal findings: Secondary | ICD-10-CM | POA: Diagnosis not present

## 2015-08-16 DIAGNOSIS — E1121 Type 2 diabetes mellitus with diabetic nephropathy: Secondary | ICD-10-CM | POA: Diagnosis not present

## 2015-08-30 ENCOUNTER — Ambulatory Visit: Payer: Commercial Managed Care - HMO

## 2015-10-06 ENCOUNTER — Other Ambulatory Visit: Payer: Self-pay | Admitting: Internal Medicine

## 2015-10-06 DIAGNOSIS — D3A090 Benign carcinoid tumor of the bronchus and lung: Secondary | ICD-10-CM

## 2015-10-13 ENCOUNTER — Ambulatory Visit
Admission: RE | Admit: 2015-10-13 | Discharge: 2015-10-13 | Disposition: A | Discharge status: Home / Self Care | Payer: Commercial Managed Care - HMO | Source: Ambulatory Visit | Attending: Internal Medicine | Admitting: Internal Medicine

## 2015-10-13 DIAGNOSIS — R918 Other nonspecific abnormal finding of lung field: Secondary | ICD-10-CM | POA: Diagnosis not present

## 2015-10-13 DIAGNOSIS — Z951 Presence of aortocoronary bypass graft: Secondary | ICD-10-CM | POA: Diagnosis not present

## 2015-10-13 DIAGNOSIS — I2510 Atherosclerotic heart disease of native coronary artery without angina pectoris: Secondary | ICD-10-CM | POA: Insufficient documentation

## 2015-10-13 DIAGNOSIS — D3A090 Benign carcinoid tumor of the bronchus and lung: Secondary | ICD-10-CM | POA: Diagnosis not present

## 2016-01-01 DIAGNOSIS — M170 Bilateral primary osteoarthritis of knee: Secondary | ICD-10-CM | POA: Diagnosis not present

## 2016-01-01 DIAGNOSIS — J432 Centrilobular emphysema: Secondary | ICD-10-CM | POA: Diagnosis not present

## 2016-01-01 DIAGNOSIS — I480 Paroxysmal atrial fibrillation: Secondary | ICD-10-CM | POA: Diagnosis not present

## 2016-01-01 DIAGNOSIS — Z1239 Encounter for other screening for malignant neoplasm of breast: Secondary | ICD-10-CM | POA: Diagnosis not present

## 2016-01-01 DIAGNOSIS — E1121 Type 2 diabetes mellitus with diabetic nephropathy: Secondary | ICD-10-CM | POA: Diagnosis not present

## 2016-01-01 DIAGNOSIS — I25810 Atherosclerosis of coronary artery bypass graft(s) without angina pectoris: Secondary | ICD-10-CM | POA: Diagnosis not present

## 2016-01-01 DIAGNOSIS — D3A090 Benign carcinoid tumor of the bronchus and lung: Secondary | ICD-10-CM | POA: Diagnosis not present

## 2016-01-01 DIAGNOSIS — I10 Essential (primary) hypertension: Secondary | ICD-10-CM | POA: Diagnosis not present

## 2016-01-08 DIAGNOSIS — M170 Bilateral primary osteoarthritis of knee: Secondary | ICD-10-CM | POA: Diagnosis not present

## 2016-01-08 DIAGNOSIS — J432 Centrilobular emphysema: Secondary | ICD-10-CM | POA: Diagnosis not present

## 2016-01-08 DIAGNOSIS — I25810 Atherosclerosis of coronary artery bypass graft(s) without angina pectoris: Secondary | ICD-10-CM | POA: Diagnosis not present

## 2016-01-08 DIAGNOSIS — Z6839 Body mass index (BMI) 39.0-39.9, adult: Secondary | ICD-10-CM | POA: Diagnosis not present

## 2016-01-08 DIAGNOSIS — I480 Paroxysmal atrial fibrillation: Secondary | ICD-10-CM | POA: Diagnosis not present

## 2016-01-08 DIAGNOSIS — D3A090 Benign carcinoid tumor of the bronchus and lung: Secondary | ICD-10-CM | POA: Diagnosis not present

## 2016-01-08 DIAGNOSIS — E1121 Type 2 diabetes mellitus with diabetic nephropathy: Secondary | ICD-10-CM | POA: Diagnosis not present

## 2016-01-08 DIAGNOSIS — I10 Essential (primary) hypertension: Secondary | ICD-10-CM | POA: Diagnosis not present

## 2016-02-02 DIAGNOSIS — I129 Hypertensive chronic kidney disease with stage 1 through stage 4 chronic kidney disease, or unspecified chronic kidney disease: Secondary | ICD-10-CM | POA: Diagnosis not present

## 2016-02-02 DIAGNOSIS — I10 Essential (primary) hypertension: Secondary | ICD-10-CM | POA: Diagnosis not present

## 2016-02-02 DIAGNOSIS — I739 Peripheral vascular disease, unspecified: Secondary | ICD-10-CM | POA: Diagnosis not present

## 2016-02-02 DIAGNOSIS — F17200 Nicotine dependence, unspecified, uncomplicated: Secondary | ICD-10-CM | POA: Diagnosis not present

## 2016-02-02 DIAGNOSIS — I714 Abdominal aortic aneurysm, without rupture: Secondary | ICD-10-CM | POA: Diagnosis not present

## 2016-02-02 DIAGNOSIS — I723 Aneurysm of iliac artery: Secondary | ICD-10-CM | POA: Diagnosis not present

## 2016-05-06 DIAGNOSIS — Z8739 Personal history of other diseases of the musculoskeletal system and connective tissue: Secondary | ICD-10-CM | POA: Diagnosis not present

## 2016-05-06 DIAGNOSIS — Z6838 Body mass index (BMI) 38.0-38.9, adult: Secondary | ICD-10-CM | POA: Diagnosis not present

## 2016-05-06 DIAGNOSIS — I10 Essential (primary) hypertension: Secondary | ICD-10-CM | POA: Diagnosis not present

## 2016-05-06 DIAGNOSIS — Z0000 Encounter for general adult medical examination without abnormal findings: Secondary | ICD-10-CM | POA: Diagnosis not present

## 2016-05-06 DIAGNOSIS — N183 Chronic kidney disease, stage 3 (moderate): Secondary | ICD-10-CM | POA: Diagnosis not present

## 2016-05-06 DIAGNOSIS — J432 Centrilobular emphysema: Secondary | ICD-10-CM | POA: Diagnosis not present

## 2016-05-06 DIAGNOSIS — M159 Polyosteoarthritis, unspecified: Secondary | ICD-10-CM | POA: Diagnosis not present

## 2016-05-06 DIAGNOSIS — Z720 Tobacco use: Secondary | ICD-10-CM | POA: Diagnosis not present

## 2016-05-06 DIAGNOSIS — I25810 Atherosclerosis of coronary artery bypass graft(s) without angina pectoris: Secondary | ICD-10-CM | POA: Diagnosis not present

## 2016-05-12 IMAGING — CR DG CHEST 2V
1 series · 2 of 2 positions shown · non-contrast
Comparison: 12/22/2012.

CLINICAL DATA: Worsening left chest pain today. History of coronary
artery disease and COPD.

EXAM:
CHEST  2 VIEW

[Series 1: dxr chest pa (or ap) and lateral · 0.14mm/px · 2 of 2 slices shown]
[im 1/2]
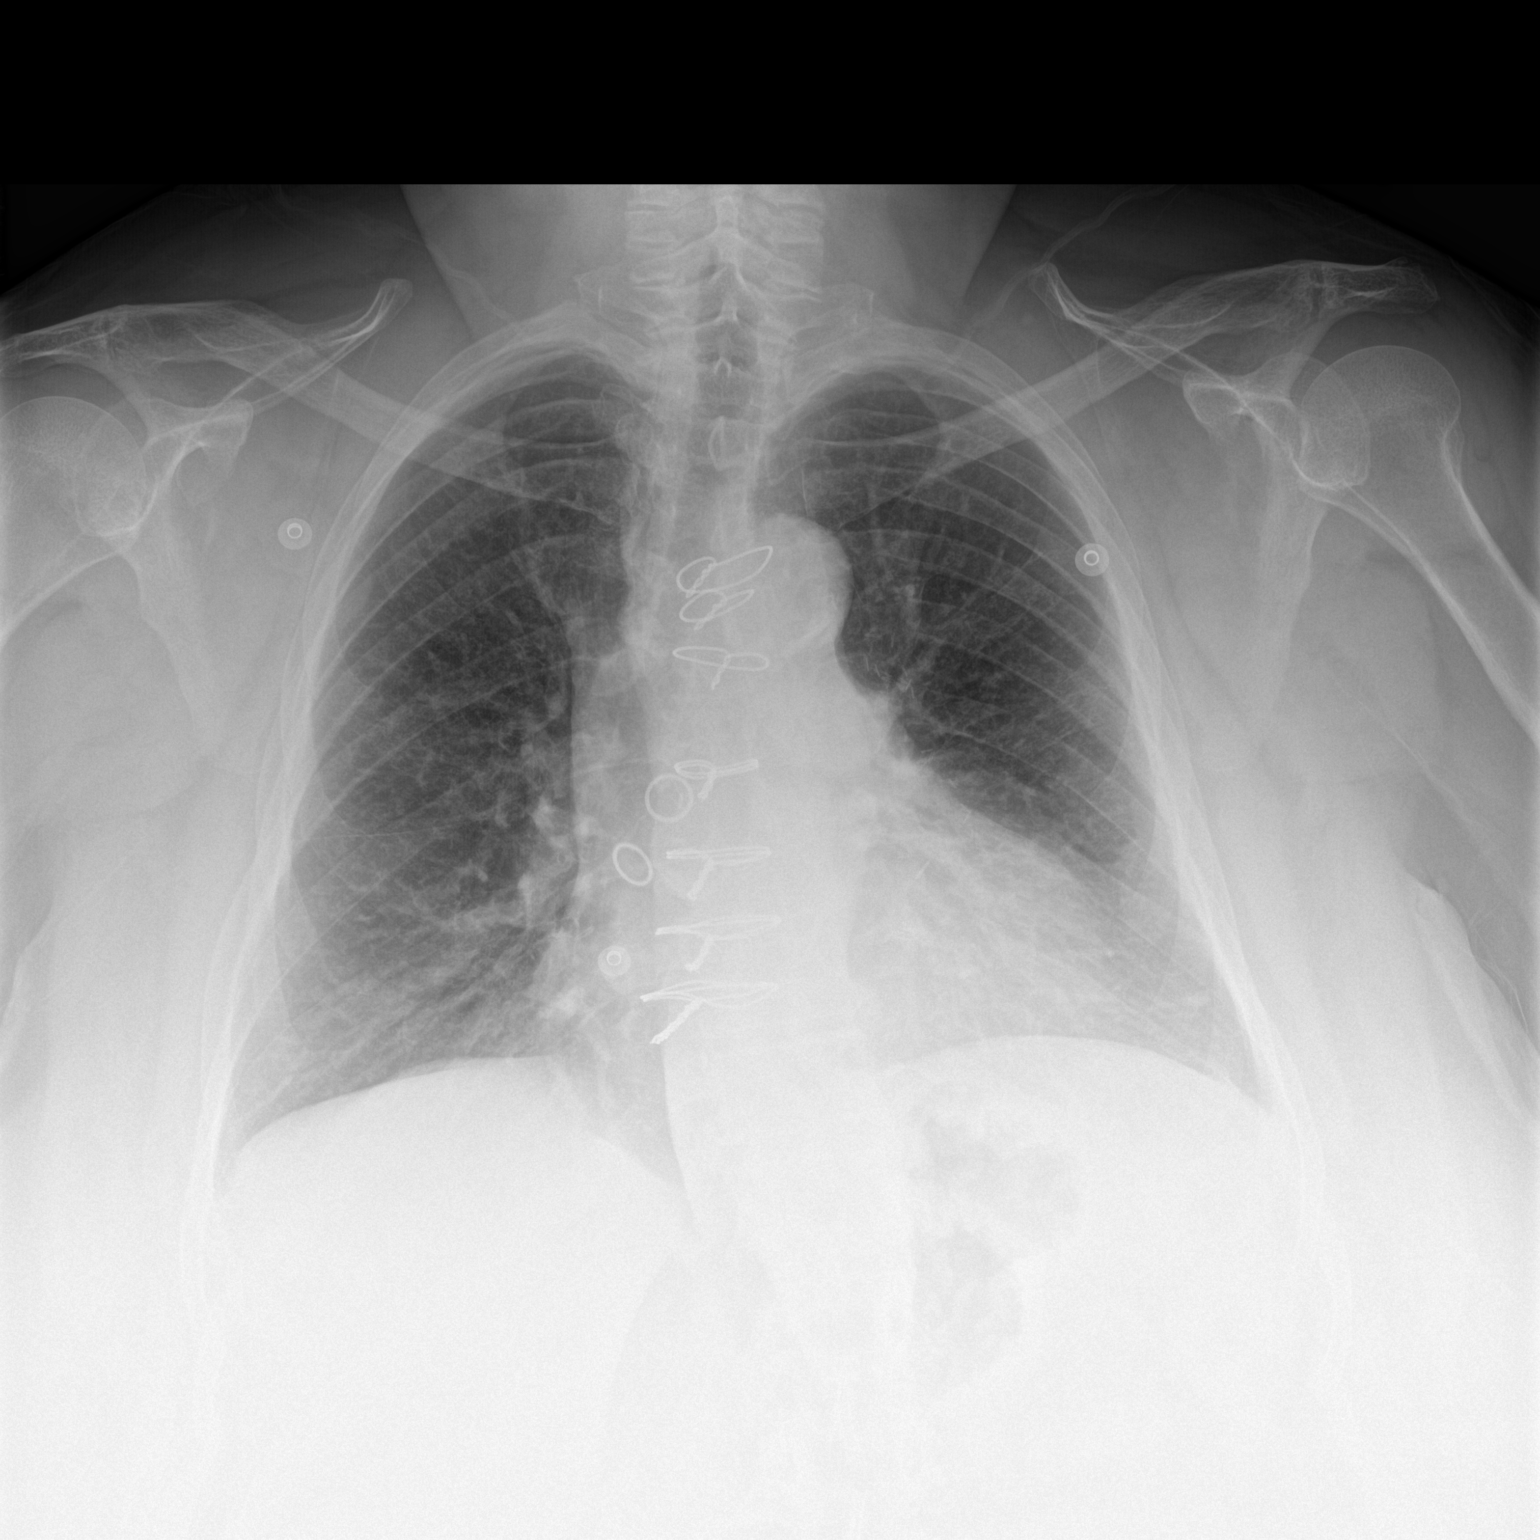
[im 2/2]
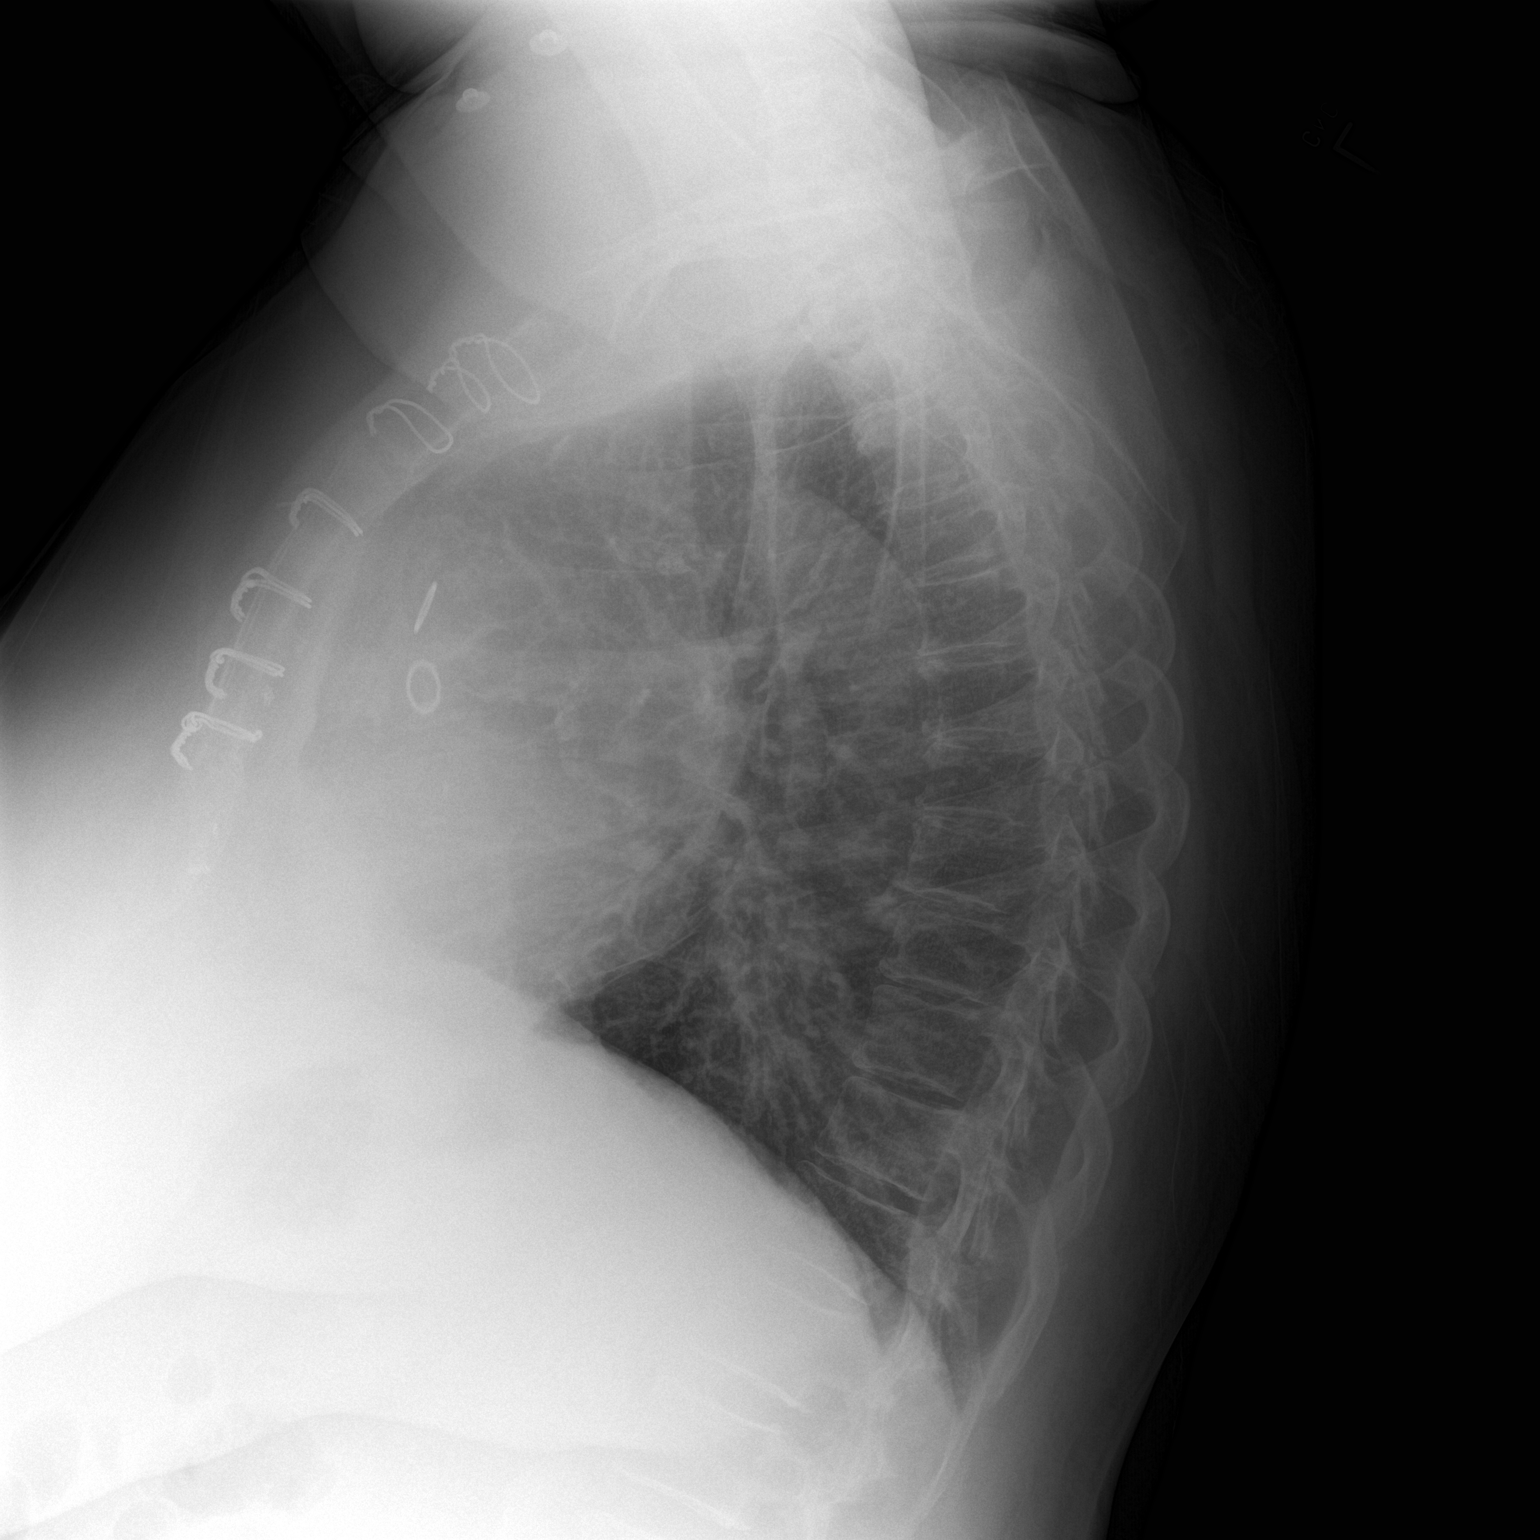

[2 of 2 positions shown; findings below may reference images not displayed]

FINDINGS: Stable mildly enlarged cardiac silhouette and post CABG changes. The
pulmonary vasculature and interstitial markings remain mildly
prominent. No pleural fluid. Mild thoracic spine degenerative
changes.
IMPRESSION: 1. No acute abnormality.
2. Stable cardiomegaly, mild pulmonary vascular congestion and mild
chronic interstitial lung disease.

## 2016-05-12 IMAGING — CT CT ABD-PELV W/O CM
2 of 4 series · 16 of 46 positions shown, 18 images · non-contrast
Comparison: None.

CLINICAL DATA: Initial encounter for left flank pain extending to
the left lower quadrant. The symptoms began at [DATE] a.m. today. No
history of kidney stones.

EXAM:
CT ABDOMEN AND PELVIS WITHOUT CONTRAST
TECHNIQUE: Multidetector CT imaging of the abdomen and pelvis was performed
following the standard protocol without IV contrast.

[Series 2: stone standard full · axial · 0.90mm/px · z∈[-490,-46]mm · 13 of 99 slices shown, 15 images]
[im 5/99  soft-tissue]
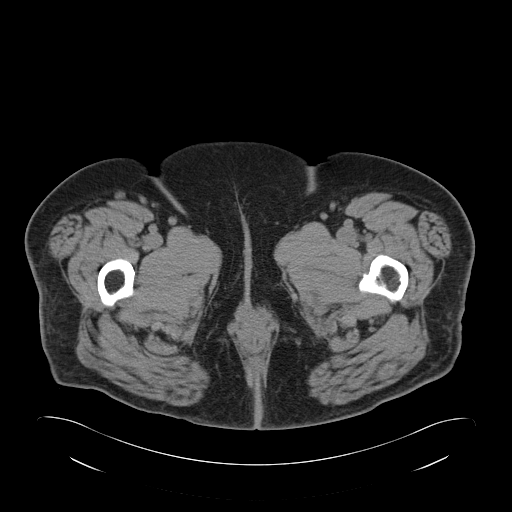
[im 5/99  bone]
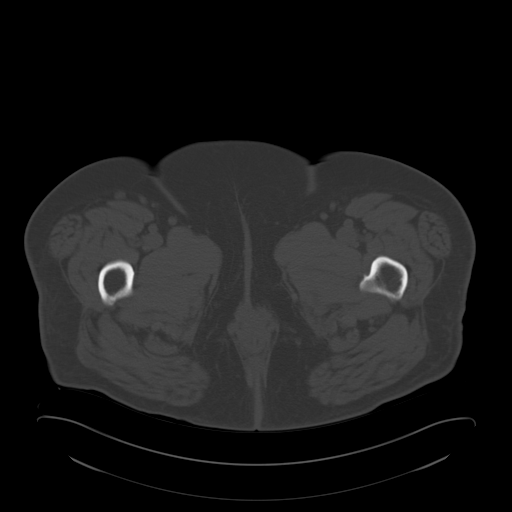
[im 13/99  soft-tissue]
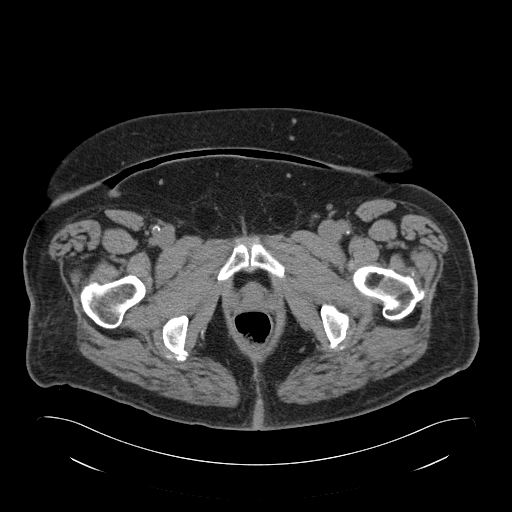
[im 21/99  soft-tissue]
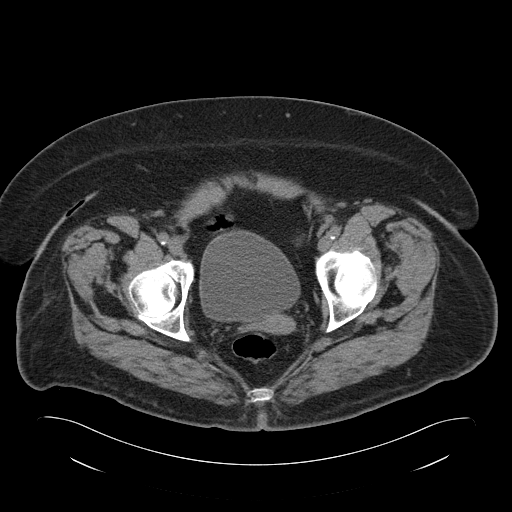
[im 29/99  soft-tissue]
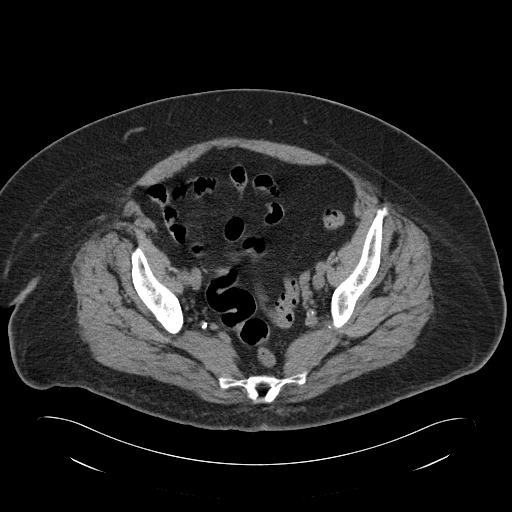
[im 33/99  soft-tissue]
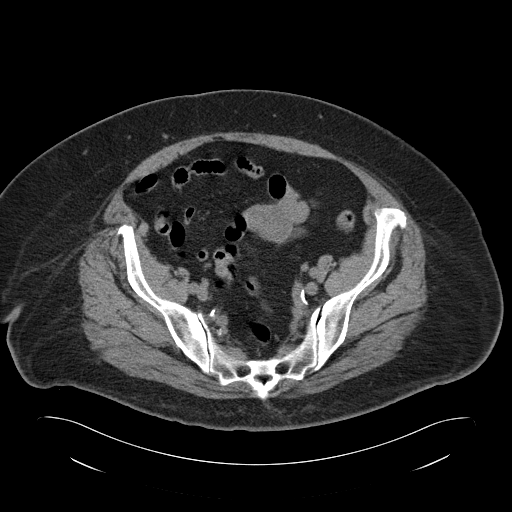
[im 41/99  soft-tissue]
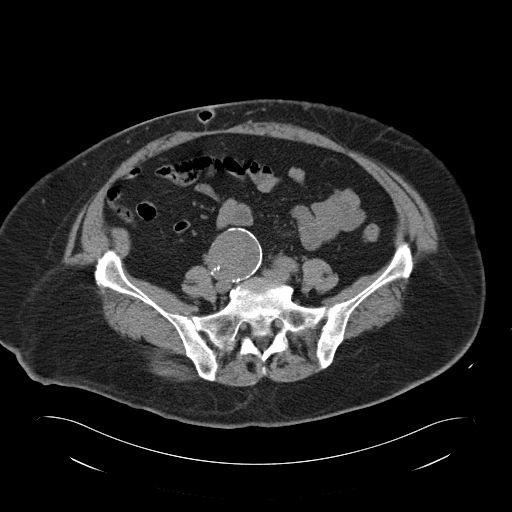
[im 50/99  soft-tissue]
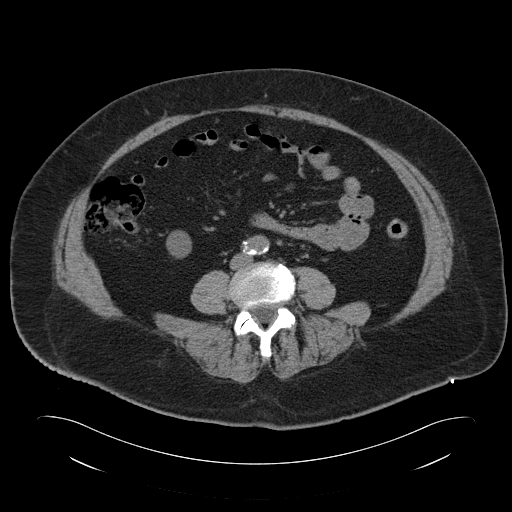
[im 58/99  soft-tissue]
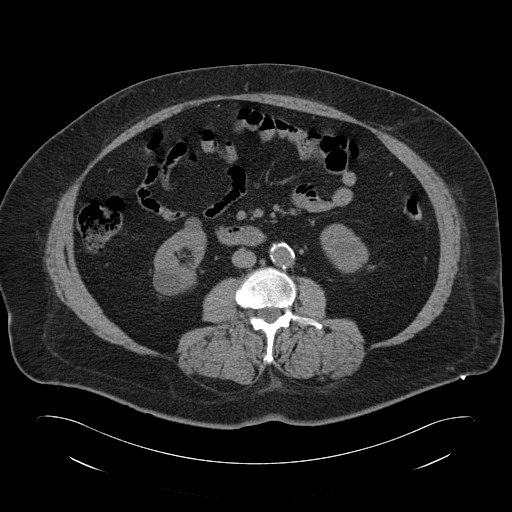
[im 66/99  soft-tissue]
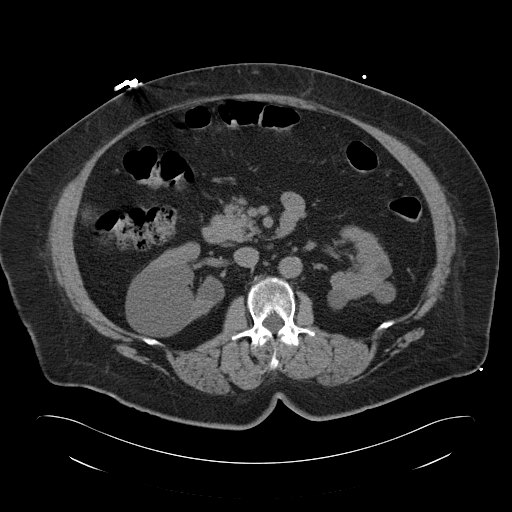
[im 66/99  bone]
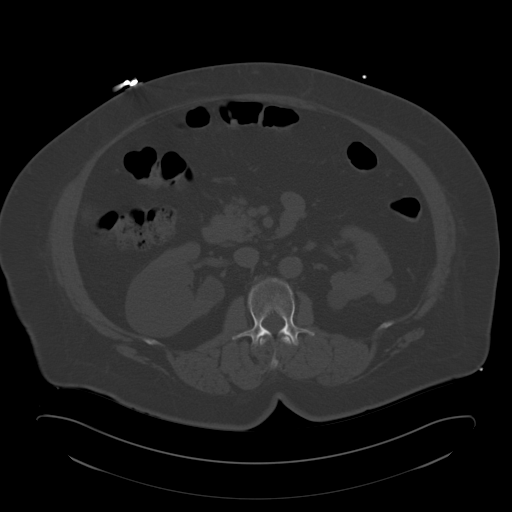
[im 70/99  soft-tissue]
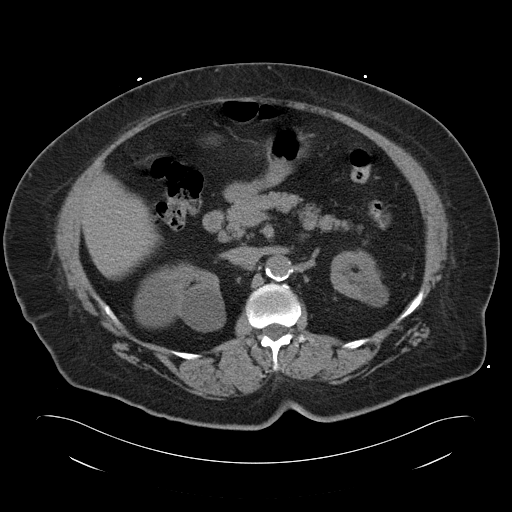
[im 78/99  soft-tissue]
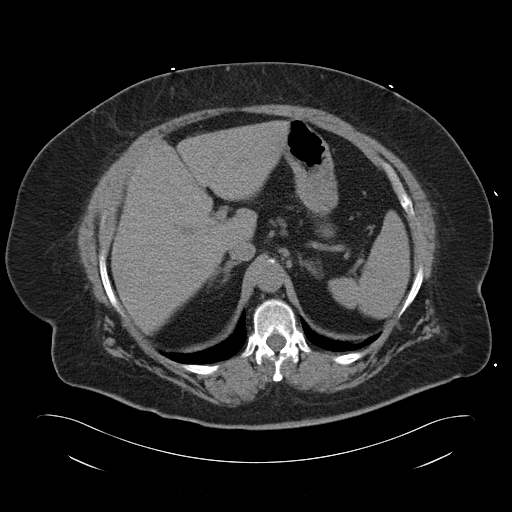
[im 86/99  soft-tissue]
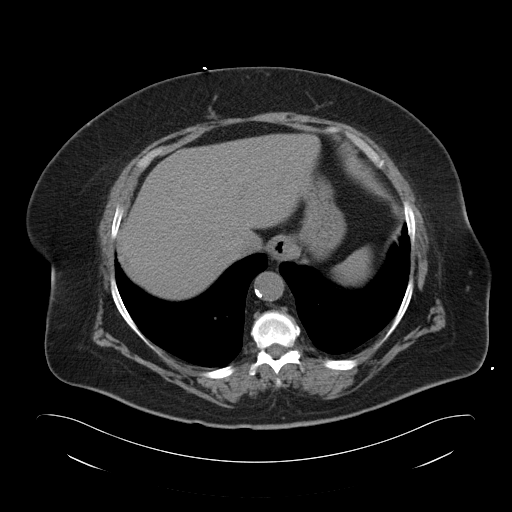
[im 94/99  soft-tissue]
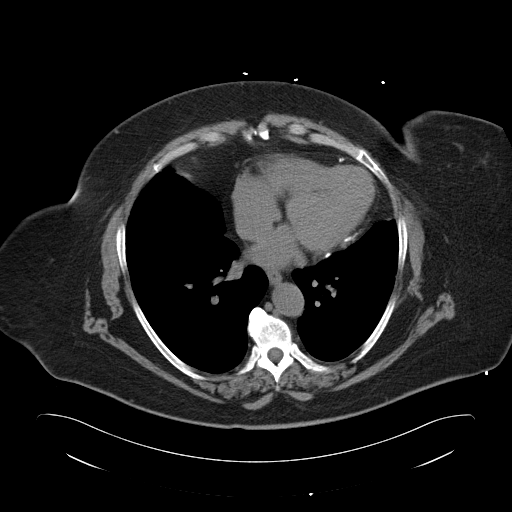

[Series 5: cor stone standard full · coronal · 0.89mm/px · 3 of 142 slices shown]
[im 48/142  soft-tissue]
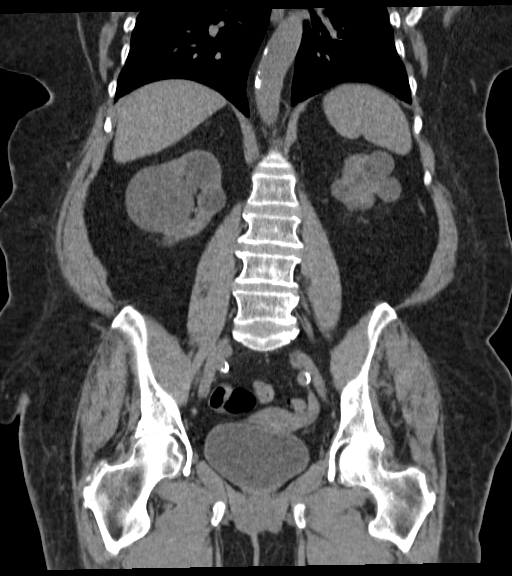
[im 63/142  soft-tissue]
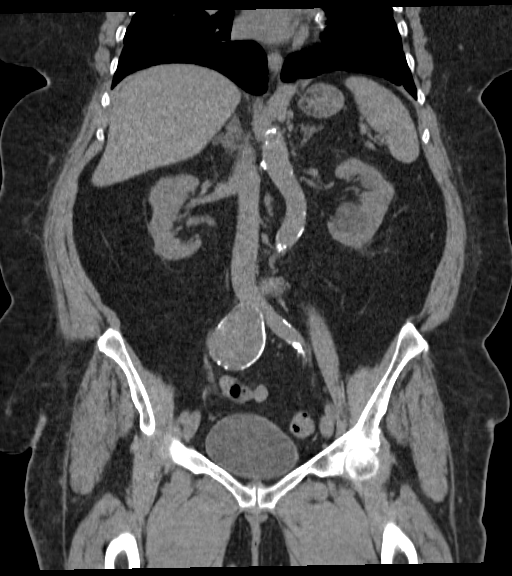
[im 79/142  soft-tissue]
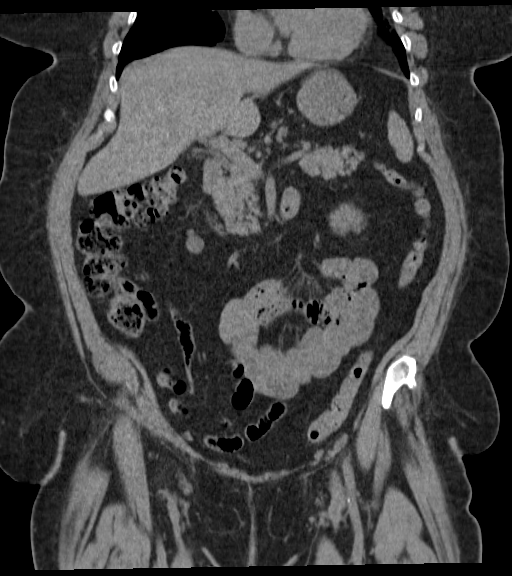

[16 of 46 positions shown; findings below may reference images not displayed]

FINDINGS: The lung bases are clear without focal nodule, mass, or airspace
disease. The heart size is normal. Coronary artery calcifications
are present. There is no significant pleural or pericardial
effusion.

The liver and spleen are within normal limits. Stomach, duodenum,
and pancreas are unremarkable. The common bile duct is within normal
limits. The gallbladder surgically absent.

The adrenal glands are normal bilaterally. Multiple cystic lesions
are present in both kidneys. Multiple exophytic lesions are present
in both kidneys. These are presumably cysts the largest is at the
upper pole of the right kidney measuring 6.0 x 6.4 cm. A more
intermediate dense lesion is present at the lower pole of the right
kidney measuring 12 mm.

There is no evidence for hydronephrosis or nephrolithiasis. The
ureters and urinary bladder are within normal limits.

The rectosigmoid colon is within normal limits. The remainder the
colon is unremarkable. The appendix is surgically absent. The small
bowel is within normal limits. Atherosclerotic calcifications are
present in the abdominal aorta. There calcifications at the origin
of the renal arteries bilaterally. Focal aneurysmal dilation is
present a proximal right iliac artery measures 4.7 x 4.7 cm. The
aneurysm extends 6 cm cephalo caudad. There is fat herniated into
the inguinal canal bilaterally. No associated bowel is present.

Bone windows demonstrate no focal lytic or blastic lesions.
IMPRESSION: 1. No evidence for nephrolithiasis or urinary tract obstruction.
2. Bilateral exophytic lesions are mostly cystic. At least 1 lesion
is present at the lower pole of the right kidney which may be solid.
Further evaluation with pre and post contrast MRI should be
considered. Pre and post contrast CT could alternatively be
performed, but would likely be of decreased accuracy given lesion
size. Non-emergent MRI should be deferred until patient has been
discharged for the acute illness, and can optimally cooperate with
positioning and breath-holding instructions.
3. Aneurysmal dilation of the proximal right iliac artery measuring
4.7 cm in diameter.
4. Extensive atherosclerotic changes including coronary artery
disease.

## 2016-08-17 DIAGNOSIS — R42 Dizziness and giddiness: Secondary | ICD-10-CM | POA: Diagnosis not present

## 2016-08-17 DIAGNOSIS — R079 Chest pain, unspecified: Secondary | ICD-10-CM | POA: Diagnosis not present

## 2016-08-21 DIAGNOSIS — N183 Chronic kidney disease, stage 3 (moderate): Secondary | ICD-10-CM | POA: Diagnosis not present

## 2016-08-21 DIAGNOSIS — F411 Generalized anxiety disorder: Secondary | ICD-10-CM | POA: Diagnosis not present

## 2016-08-21 DIAGNOSIS — Z6838 Body mass index (BMI) 38.0-38.9, adult: Secondary | ICD-10-CM | POA: Diagnosis not present

## 2016-08-21 DIAGNOSIS — E1121 Type 2 diabetes mellitus with diabetic nephropathy: Secondary | ICD-10-CM | POA: Diagnosis not present

## 2016-08-21 DIAGNOSIS — Z0000 Encounter for general adult medical examination without abnormal findings: Secondary | ICD-10-CM | POA: Diagnosis not present

## 2016-08-21 DIAGNOSIS — M159 Polyosteoarthritis, unspecified: Secondary | ICD-10-CM | POA: Diagnosis not present

## 2016-08-21 DIAGNOSIS — I25810 Atherosclerosis of coronary artery bypass graft(s) without angina pectoris: Secondary | ICD-10-CM | POA: Diagnosis not present

## 2016-08-21 DIAGNOSIS — Z8739 Personal history of other diseases of the musculoskeletal system and connective tissue: Secondary | ICD-10-CM | POA: Diagnosis not present

## 2016-08-21 DIAGNOSIS — I480 Paroxysmal atrial fibrillation: Secondary | ICD-10-CM | POA: Diagnosis not present

## 2016-08-21 DIAGNOSIS — J432 Centrilobular emphysema: Secondary | ICD-10-CM | POA: Diagnosis not present

## 2016-08-21 DIAGNOSIS — I10 Essential (primary) hypertension: Secondary | ICD-10-CM | POA: Diagnosis not present

## 2016-08-21 DIAGNOSIS — Z720 Tobacco use: Secondary | ICD-10-CM | POA: Diagnosis not present

## 2016-08-21 DIAGNOSIS — R55 Syncope and collapse: Secondary | ICD-10-CM | POA: Diagnosis not present

## 2016-08-21 DIAGNOSIS — D3A090 Benign carcinoid tumor of the bronchus and lung: Secondary | ICD-10-CM | POA: Diagnosis not present

## 2016-12-20 DIAGNOSIS — E1121 Type 2 diabetes mellitus with diabetic nephropathy: Secondary | ICD-10-CM | POA: Diagnosis not present

## 2016-12-20 DIAGNOSIS — R55 Syncope and collapse: Secondary | ICD-10-CM | POA: Diagnosis not present

## 2016-12-20 DIAGNOSIS — Z720 Tobacco use: Secondary | ICD-10-CM | POA: Diagnosis not present

## 2016-12-20 DIAGNOSIS — D3A090 Benign carcinoid tumor of the bronchus and lung: Secondary | ICD-10-CM | POA: Diagnosis not present

## 2016-12-20 DIAGNOSIS — Z0000 Encounter for general adult medical examination without abnormal findings: Secondary | ICD-10-CM | POA: Diagnosis not present

## 2016-12-20 DIAGNOSIS — I480 Paroxysmal atrial fibrillation: Secondary | ICD-10-CM | POA: Diagnosis not present

## 2016-12-20 DIAGNOSIS — I25810 Atherosclerosis of coronary artery bypass graft(s) without angina pectoris: Secondary | ICD-10-CM | POA: Diagnosis not present

## 2016-12-20 DIAGNOSIS — I10 Essential (primary) hypertension: Secondary | ICD-10-CM | POA: Diagnosis not present

## 2016-12-20 DIAGNOSIS — F411 Generalized anxiety disorder: Secondary | ICD-10-CM | POA: Diagnosis not present

## 2016-12-23 DIAGNOSIS — E875 Hyperkalemia: Secondary | ICD-10-CM | POA: Diagnosis not present

## 2017-01-01 DIAGNOSIS — J432 Centrilobular emphysema: Secondary | ICD-10-CM | POA: Diagnosis not present

## 2017-01-01 DIAGNOSIS — I25810 Atherosclerosis of coronary artery bypass graft(s) without angina pectoris: Secondary | ICD-10-CM | POA: Diagnosis not present

## 2017-01-01 DIAGNOSIS — R5383 Other fatigue: Secondary | ICD-10-CM | POA: Diagnosis not present

## 2017-01-01 DIAGNOSIS — N184 Chronic kidney disease, stage 4 (severe): Secondary | ICD-10-CM | POA: Diagnosis not present

## 2017-01-01 DIAGNOSIS — I10 Essential (primary) hypertension: Secondary | ICD-10-CM | POA: Diagnosis not present

## 2017-01-01 DIAGNOSIS — I480 Paroxysmal atrial fibrillation: Secondary | ICD-10-CM | POA: Diagnosis not present

## 2017-01-01 DIAGNOSIS — R739 Hyperglycemia, unspecified: Secondary | ICD-10-CM | POA: Diagnosis not present

## 2017-01-01 DIAGNOSIS — F17200 Nicotine dependence, unspecified, uncomplicated: Secondary | ICD-10-CM | POA: Diagnosis not present

## 2017-01-20 DIAGNOSIS — I129 Hypertensive chronic kidney disease with stage 1 through stage 4 chronic kidney disease, or unspecified chronic kidney disease: Secondary | ICD-10-CM | POA: Diagnosis not present

## 2017-01-20 DIAGNOSIS — M109 Gout, unspecified: Secondary | ICD-10-CM | POA: Diagnosis not present

## 2017-01-20 DIAGNOSIS — N184 Chronic kidney disease, stage 4 (severe): Secondary | ICD-10-CM | POA: Diagnosis not present

## 2017-01-20 DIAGNOSIS — E1122 Type 2 diabetes mellitus with diabetic chronic kidney disease: Secondary | ICD-10-CM | POA: Diagnosis not present

## 2017-01-21 DIAGNOSIS — E1122 Type 2 diabetes mellitus with diabetic chronic kidney disease: Secondary | ICD-10-CM | POA: Diagnosis not present

## 2017-01-21 DIAGNOSIS — I129 Hypertensive chronic kidney disease with stage 1 through stage 4 chronic kidney disease, or unspecified chronic kidney disease: Secondary | ICD-10-CM | POA: Diagnosis not present

## 2017-01-21 DIAGNOSIS — N184 Chronic kidney disease, stage 4 (severe): Secondary | ICD-10-CM | POA: Diagnosis not present

## 2017-01-21 DIAGNOSIS — M109 Gout, unspecified: Secondary | ICD-10-CM | POA: Diagnosis not present

## 2017-01-31 ENCOUNTER — Other Ambulatory Visit (INDEPENDENT_AMBULATORY_CARE_PROVIDER_SITE_OTHER): Payer: Self-pay | Admitting: Vascular Surgery

## 2017-01-31 DIAGNOSIS — Z95828 Presence of other vascular implants and grafts: Secondary | ICD-10-CM

## 2017-02-01 IMAGING — US US EXTREM LOW VENOUS*R*
1 series · 13 of 24 positions shown · non-contrast
Comparison: None.

CLINICAL DATA: One day history of right lower extremity pain and
swelling

EXAM:
RIGHT LOWER EXTREMITY VENOUS DUPLEX ULTRASOUND
TECHNIQUE: Gray-scale sonography with graded compression, as well as color
Doppler and duplex ultrasound were performed to evaluate the right
lower extremity deep venous systems from the level of the common
femoral vein and including the common femoral, femoral, profunda
femoral, popliteal and calf veins including the posterior tibial,
peroneal and gastrocnemius veins when visible. The superficial great
saphenous vein was also interrogated. Spectral Doppler was utilized
to evaluate flow at rest and with distal augmentation maneuvers in
the common femoral, femoral and popliteal veins.

[Series 1: us extrem low venous*right* · 0.09mm/px · 13 of 32 slices shown]
[im 1/32]
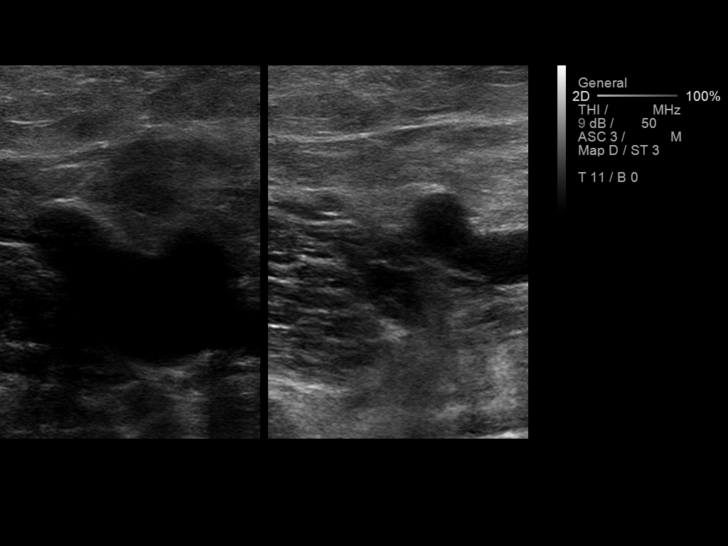
[im 3/32]
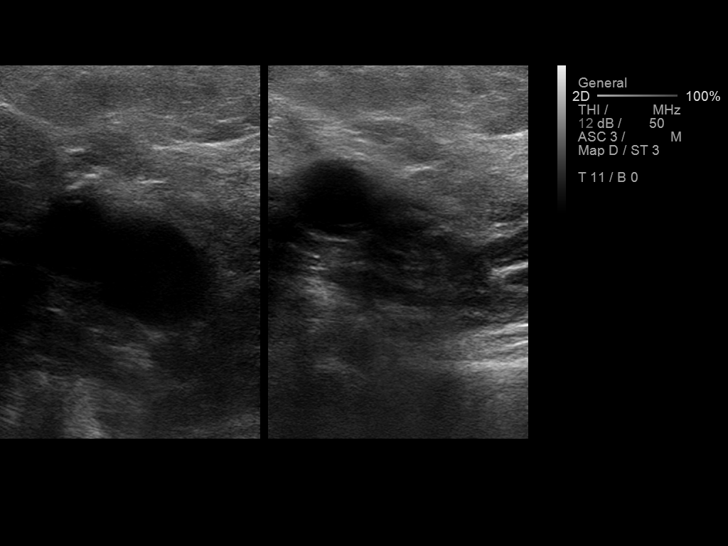
[im 6/32]
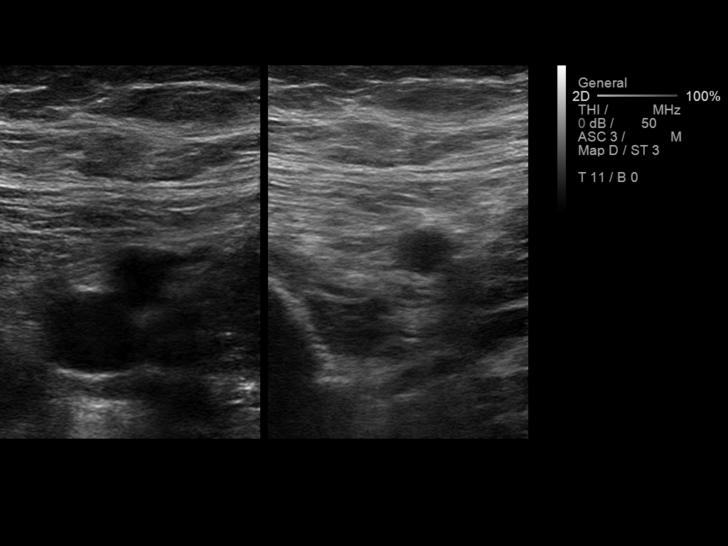
[im 9/32]
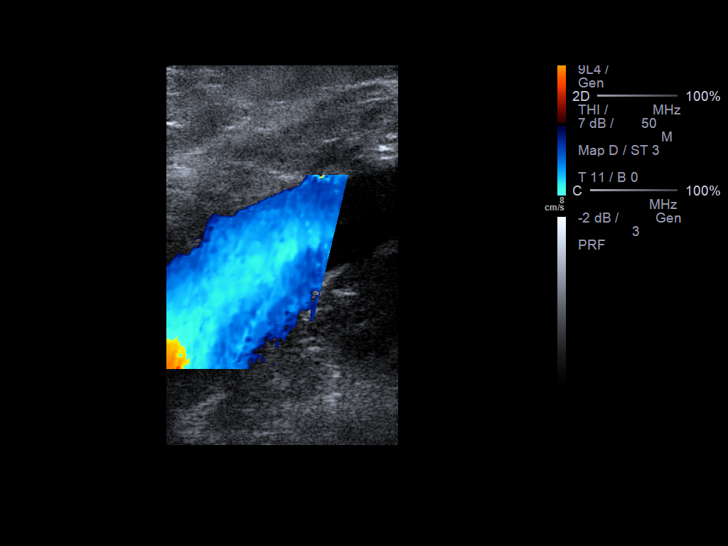
[im 11/32]
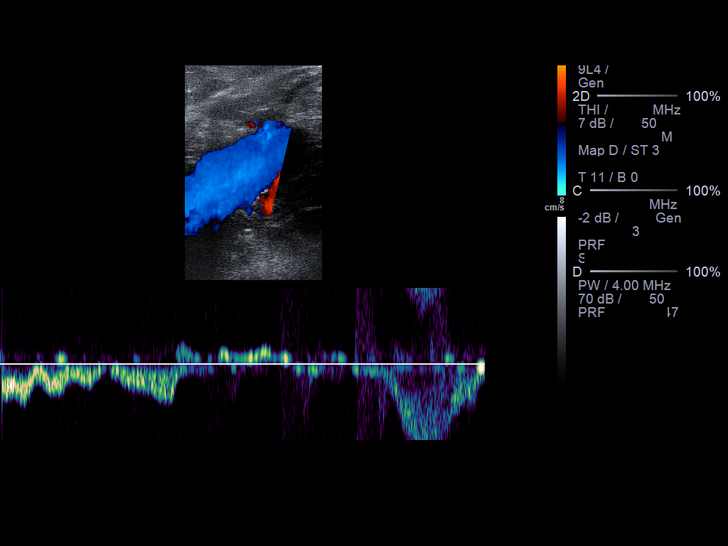
[im 14/32]
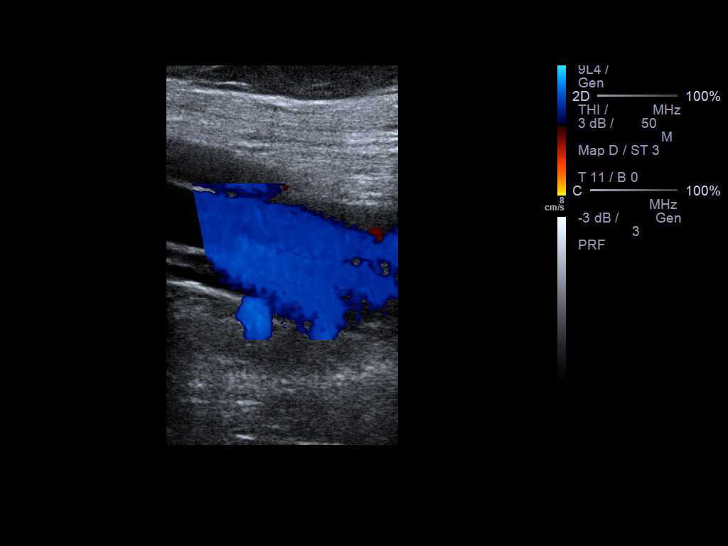
[im 17/32]
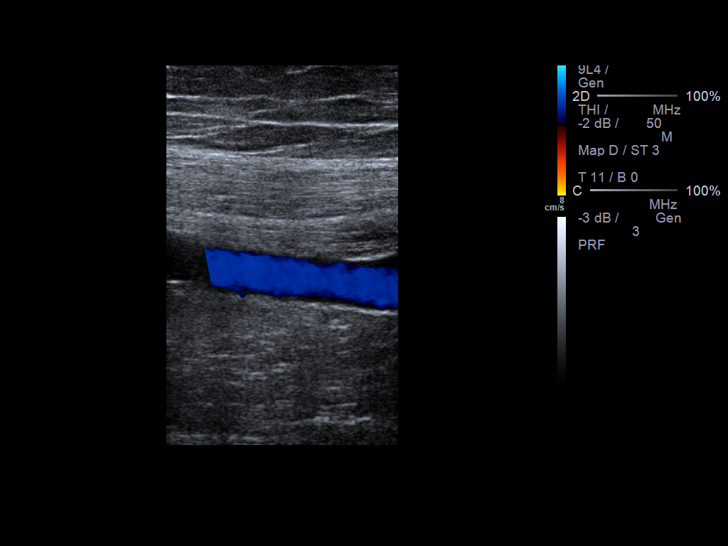
[im 18/32]
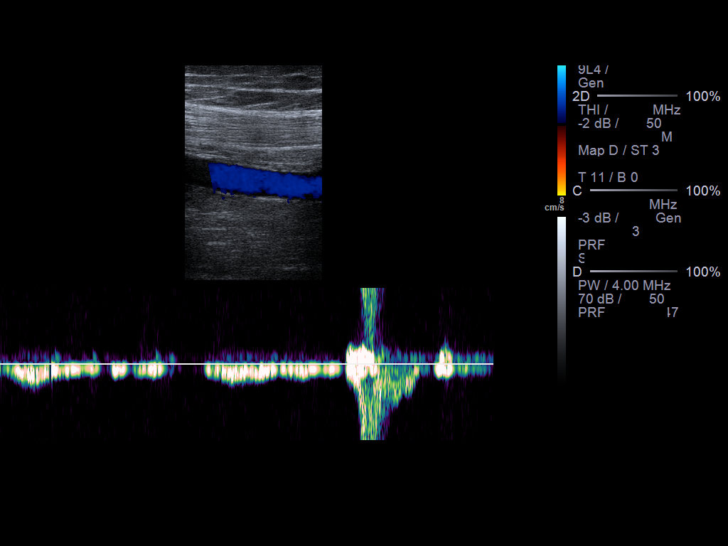
[im 21/32]
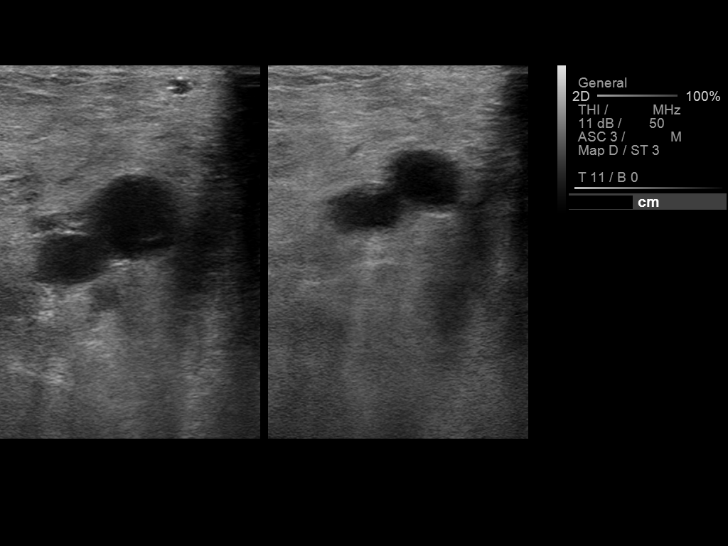
[im 23/32]
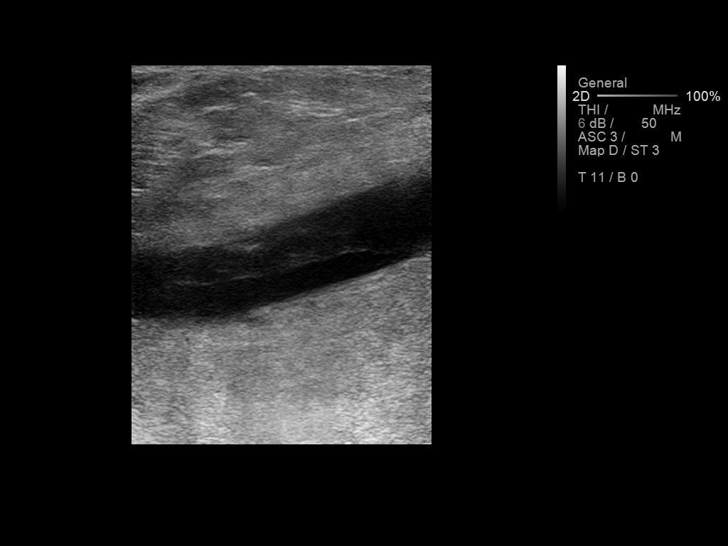
[im 26/32]
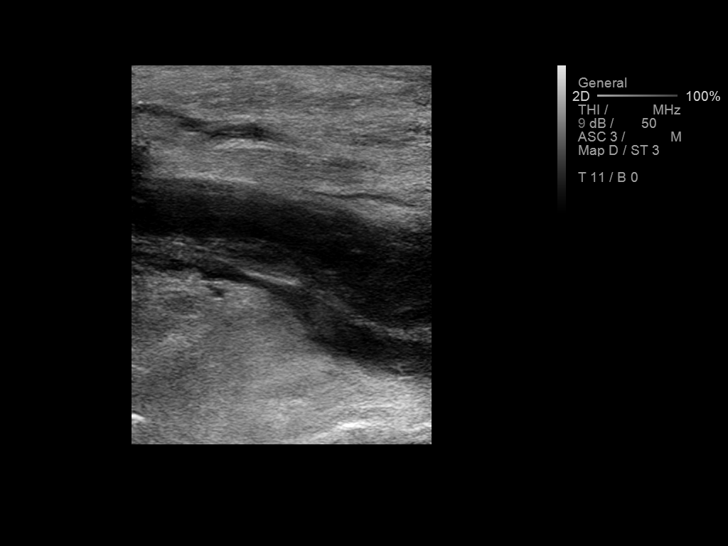
[im 29/32]
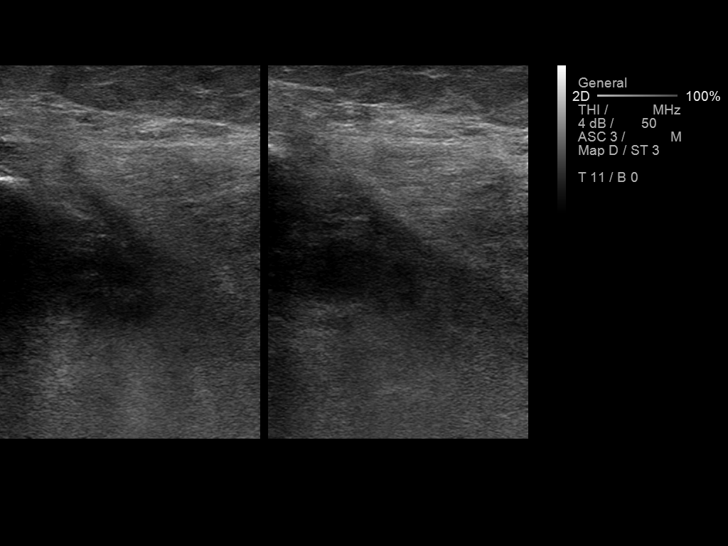
[im 32/32]
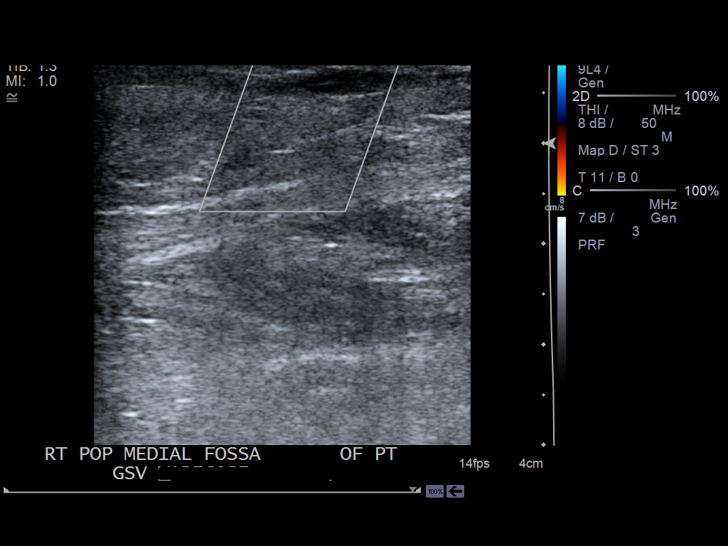

[13 of 24 positions shown; findings below may reference images not displayed]

FINDINGS: Contralateral Common Femoral Vein: Respiratory phasicity is normal
and symmetric with the symptomatic side. No evidence of thrombus.
Normal compressibility.

Common Femoral Vein: No evidence of thrombus. Normal
compressibility, respiratory phasicity and response to augmentation.

Saphenofemoral Junction: No evidence of thrombus. Normal
compressibility and flow on color Doppler imaging.

Profunda Femoral Vein: No evidence of thrombus. Normal
compressibility and flow on color Doppler imaging.

Femoral Vein: No evidence of thrombus. Normal compressibility,
respiratory phasicity and response to augmentation.

Popliteal Vein: There is thrombus in the right popliteal vein which
appears acute. There is partial lack of compression and augmentation
in this vessel proximally with complete loss of compression and
augmentation distally in this vessel.

Calf Veins: There is acute appearing thrombus in the right peroneal
and posterior tibial veins with lack of augmentation and
compressibility.

Superficial Great Saphenous Vein: No evidence of thrombus. Normal
compressibility and flow on color Doppler imaging. There is a
varicosity of the right greater saphenous vein in the calf region
which contains acute thrombus and is noncompressible.

Venous Reflux:  None.

Other Findings:  None.
IMPRESSION: Evidence of acute deep venous thrombosis in the right popliteal and
calf veins. There is also acute thrombus in a greater saphenous vein
varicosity in the calf region. No thrombus is seen proximal to the
popliteal vein on the right. The left common femoral vein appears
patent.

## 2017-02-04 ENCOUNTER — Other Ambulatory Visit (INDEPENDENT_AMBULATORY_CARE_PROVIDER_SITE_OTHER): Payer: Medicare HMO

## 2017-02-04 ENCOUNTER — Ambulatory Visit (INDEPENDENT_AMBULATORY_CARE_PROVIDER_SITE_OTHER): Payer: Self-pay | Admitting: Vascular Surgery

## 2017-02-07 DIAGNOSIS — J40 Bronchitis, not specified as acute or chronic: Secondary | ICD-10-CM | POA: Diagnosis not present

## 2017-03-18 ENCOUNTER — Other Ambulatory Visit (INDEPENDENT_AMBULATORY_CARE_PROVIDER_SITE_OTHER): Payer: Medicare HMO

## 2017-03-18 ENCOUNTER — Ambulatory Visit (INDEPENDENT_AMBULATORY_CARE_PROVIDER_SITE_OTHER): Payer: Self-pay | Admitting: Vascular Surgery

## 2017-03-21 ENCOUNTER — Ambulatory Visit (INDEPENDENT_AMBULATORY_CARE_PROVIDER_SITE_OTHER): Payer: Medicare HMO

## 2017-03-21 ENCOUNTER — Encounter (INDEPENDENT_AMBULATORY_CARE_PROVIDER_SITE_OTHER): Payer: Self-pay | Admitting: Vascular Surgery

## 2017-03-21 ENCOUNTER — Ambulatory Visit (INDEPENDENT_AMBULATORY_CARE_PROVIDER_SITE_OTHER): Payer: Medicare HMO | Admitting: Vascular Surgery

## 2017-03-21 VITALS — BP 175/89 | HR 69 | Resp 16 | Wt 234.0 lb

## 2017-03-21 DIAGNOSIS — I714 Abdominal aortic aneurysm, without rupture, unspecified: Secondary | ICD-10-CM | POA: Insufficient documentation

## 2017-03-21 DIAGNOSIS — J449 Chronic obstructive pulmonary disease, unspecified: Secondary | ICD-10-CM | POA: Diagnosis not present

## 2017-03-21 DIAGNOSIS — I10 Essential (primary) hypertension: Secondary | ICD-10-CM

## 2017-03-21 DIAGNOSIS — Z95828 Presence of other vascular implants and grafts: Secondary | ICD-10-CM

## 2017-03-21 DIAGNOSIS — E119 Type 2 diabetes mellitus without complications: Secondary | ICD-10-CM | POA: Diagnosis not present

## 2017-03-21 DIAGNOSIS — N184 Chronic kidney disease, stage 4 (severe): Secondary | ICD-10-CM | POA: Diagnosis not present

## 2017-03-21 NOTE — Assessment & Plan Note (Signed)
blood pressure control important in reducing the progression of atherosclerotic disease. On appropriate oral medications.  

## 2017-03-21 NOTE — Assessment & Plan Note (Signed)
Fairly significant and has been told she cannot have general anesthesia anymore.

## 2017-03-21 NOTE — Assessment & Plan Note (Addendum)
She has had decline of her renal function.  Normally, with the duplex findings we will get a CT angiogram but I do not think that would be prudent given her renal function.  This certainly complicates the situation

## 2017-03-21 NOTE — Progress Notes (Signed)
MRN : [id number]  Michelle Murillo is a 71 y.o. (04-26-1946) female who presents with chief complaint of  Chief Complaint  Patient presents with  . Follow-up    1 year [place]  .  History of Present Illness: Patient returns today in follow up of her AAA.  She has many issues today.  She has what looks like a hammertoe on her right foot with a small ulceration on her right fourth toe.  This is painful.  She is unable to wear some of her shoes.  She has not had previous ulceration or infection on her foot. She also reports that her kidney disease is progressing.  She is scheduled to see the nephrologist in the upcoming weeks.  Her creatinine clearance was down to 28 at last check.  This has resulted in fatigue, lethargy, and poor appetite. She denies any obvious aneurysm related symptoms. Specifically, the patient denies new back or abdominal pain, or signs of peripheral embolization.  Her duplex today does show what appears to be an endoleak with growth of her sac size now measuring 5.6 cm x 6.1 cm.  Previously, this was below 5 cm.  Current Outpatient Prescriptions  Medication Sig Dispense Refill  . albuterol (PROVENTIL HFA) 108 (90 Base) MCG/ACT inhaler Inhale into the lungs.    Marland Kitchen allopurinol (ZYLOPRIM) 300 MG tablet     . aspirin EC 81 MG tablet Take 81 mg by mouth daily.    . benzonatate (TESSALON) 100 MG capsule     . colchicine 0.6 MG tablet Take by mouth.    . gabapentin (NEURONTIN) 300 MG capsule Take by mouth.    . losartan (COZAAR) 100 MG tablet TAKE 1 TABLET ONE TIME DAILY    . metoprolol tartrate (LOPRESSOR) 25 MG tablet     . omeprazole (PRILOSEC) 40 MG capsule TAKE 1 CAPSULE ONE TIME DAILY AS NEEDED    . Vitamin D, Ergocalciferol, (DRISDOL) 50000 units CAPS capsule     . furosemide (LASIX) 40 MG tablet TAKE 1 TABLET EVERY DAY    . Rivaroxaban (XARELTO STARTER PACK) 15 & 20 MG TBPK Take as directed on package: Start with one 15mg  tablet by mouth twice a day with food. On Day  22, switch to one 20mg  tablet once a day with food. (Patient not taking: Reported on 03/21/2017) 51 each 0   No current facility-administered medications for this visit.     Past Medical History:  Diagnosis Date  . Abdominal aneurysm ([place])   . Cancer ([place])   . CHF (congestive heart failure) ([place])   . COPD (chronic obstructive pulmonary disease) ([place])   . Diabetes mellitus without complication ([place])   . GERD (gastroesophageal reflux disease)   . Gout   . Hypertension     Past Surgical History:  Procedure Laterality Date  . ABDOMINAL SURGERY    . BRONCHOSCOPY    . BYPASS GRAFT     4  . CARDIAC SURGERY      Social History Social History  Substance Use Topics  . Smoking status: Current Every Day Smoker    Packs/day: 0.50    Types: Cigarettes  . Smokeless tobacco: Never Used  . Alcohol use No  lives alone  Family History No bleeding or clotting disorders No aneurysms, autoimmune diseases, or porphyria  Allergies  Allergen Reactions  . Bupropion Nausea Only    Other reaction(s): Other (See Comments) "I didn't do well with it."  . Morphine And Related Hives  REVIEW OF SYSTEMS (Negative unless checked)  Constitutional: [] Weight loss  [] Fever  [] Chills Cardiac: [] Chest pain   [] Chest pressure   [] Palpitations   [] Shortness of breath when laying flat   [] Shortness of breath at rest   [] Shortness of breath with exertion. Vascular:  [] Pain in legs with walking   [] Pain in legs at rest   [] Pain in legs when laying flat   [] Claudication   [] Pain in feet when walking  [] Pain in feet at rest  [] Pain in feet when laying flat   [] History of DVT   [] Phlebitis   [x] Swelling in legs   [] Varicose veins   [] Non-healing ulcers Pulmonary:   [] Uses home oxygen   [] Productive cough   [] Hemoptysis   [] Wheeze  [x] COPD   [] Asthma Neurologic:  [] Dizziness  [] Blackouts   [] Seizures   [] History of stroke   [] History of TIA  [] Aphasia   [] Temporary blindness   [] Dysphagia   [] Weakness or  numbness in arms   [] Weakness or numbness in legs Musculoskeletal:  [x] Arthritis   [] Joint swelling   [x] Joint pain   [] Low back pain Hematologic:  [] Easy bruising  [] Easy bleeding   [] Hypercoagulable state   [] Anemic   Gastrointestinal:  [] Blood in stool   [] Vomiting blood  [x] Gastroesophageal reflux/heartburn   [] Abdominal pain Genitourinary:  [x] Chronic kidney disease   [] Difficult urination  [] Frequent urination  [] Burning with urination   [] Hematuria Skin:  [] Rashes   [] Ulcers   [] Wounds Psychological:  [] History of anxiety   []  History of major depression.  Physical Examination  BP (!) 175/89 (BP Location: Right Arm)   Pulse 69   Resp 16   Wt 106.1 kg (234 lb)   BMI 38.94 kg/m  Gen:  WD/WN, NAD Head: [place]/AT, No temporalis wasting. Ear/Nose/Throat: Hearing grossly intact, nares w/o erythema or drainage, trachea midline Eyes: Conjunctiva clear. Sclera non-icteric. Visual acuity is poor Neck: Supple.  No JVD.  Pulmonary:  Good air movement, no use of accessory muscles.  Cardiac: RRR, normal S1, S2 Vascular:  Vessel Right Left  Radial Palpable Palpable                          PT Palpable Palpable  DP Palpable Palpable    Musculoskeletal: M/S 5/5 throughout.  No deformity or atrophy. Trace LE edema. Neurologic: Sensation grossly intact in extremities.  Symmetrical.  Speech is fluent.  Psychiatric: Judgment intact, Mood & affect appropriate for pt's clinical situation. Dermatologic: No rashes or ulcers noted.  No cellulitis or open wounds. Lymph : No Cervical, Axillary, or Inguinal lymphadenopathy.      Labs No results found for this or any previous visit (from the past 2160 hour(s)).  Radiology No results found.   Assessment/Plan  Hypertension blood pressure control important in reducing the progression of atherosclerotic disease. On appropriate oral medications.   COPD (chronic obstructive pulmonary disease) ([place]) Fairly significant and has been told she  cannot have general anesthesia anymore.  Diabetes mellitus without complication (HCC) blood glucose control important in reducing the progression of atherosclerotic disease. Also, involved in wound healing. On appropriate medications.   Chronic kidney disease (CKD), stage IV (severe) (HCC) She has had decline of her renal function.  Normally, with the duplex findings we will get a CT angiogram but I do not think that would be prudent given her renal function.  This certainly complicates the situation  AAA (abdominal aortic aneurysm) without rupture ([place]) Her duplex today does show what  appears to be an endoleak with growth of her sac size now measuring 5.6 cm x 6.1 cm.  Previously, this was below 5 cm. We had a long discussion today.  This is not an encouraging finding.  Normally a CT angiogram would be performed, but given her renal function I do not think that is a great option.  I think a short interval follow-up with duplex then followed by an angiogram with intention to treat would be the next steps if she continues to have growth.  She voices her understanding and is agreed care.  With    Leotis Pain, MD  03/21/2017 10:22 AM    This note was created with Dragon medical transcription system.  Any errors from dictation are purely unintentional

## 2017-03-21 NOTE — Assessment & Plan Note (Signed)
Her duplex today does show what appears to be an endoleak with growth of her sac size now measuring 5.6 cm x 6.1 cm.  Previously, this was below 5 cm. We had a long discussion today.  This is not an encouraging finding.  Normally a CT angiogram would be performed, but given her renal function I do not think that is a great option.  I think a short interval follow-up with duplex then followed by an angiogram with intention to treat would be the next steps if she continues to have growth.  She voices her understanding and is agreed care.  With

## 2017-03-21 NOTE — Assessment & Plan Note (Signed)
blood glucose control important in reducing the progression of atherosclerotic disease. Also, involved in wound healing. On appropriate medications.  

## 2017-03-21 NOTE — Patient Instructions (Signed)
Abdominal Aortic Aneurysm Blood pumps away from the heart through tubes (blood vessels) called arteries. Aneurysms are weak or damaged places in the wall of an artery. It bulges out like a balloon. An abdominal aortic aneurysm happens in the main artery of the body (aorta). It can burst or tear, causing bleeding inside the body. This is an emergency. It needs treatment right away. What are the causes? The exact cause is unknown. Things that could cause this problem include:  Fat and other substances building up in the lining of a tube.  Swelling of the walls of a blood vessel.  Certain tissue diseases.  Belly (abdominal) trauma.  An infection in the main artery of the body.  What increases the risk? There are things that make it more likely for you to have an aneurysm. These include:  Being over the age of 72 years old.  Having high blood pressure (hypertension).  Being a female.  Being white.  Being very overweight (obese).  Having a family history of aneurysm.  Using tobacco products.  What are the signs or symptoms? Symptoms depend on the size of the aneurysm and how fast it grows. There may not be symptoms. If symptoms occur, they can include:  Pain (belly, side, lower back, or groin).  Feeling full after eating a small amount of food.  Feeling sick to your stomach (nauseous), throwing up (vomiting), or both.  Feeling a lump in your belly that feels like it is beating (pulsating).  Feeling like you will pass out (faint).  How is this treated?  Medicine to control blood pressure and pain.  Imaging tests to see if the aneurysm gets bigger.  Surgery. How is this prevented? To lessen your chance of getting this condition:  Stop smoking. Stop chewing tobacco.  Limit or avoid alcohol.  Keep your blood pressure, blood sugar, and cholesterol within normal limits.  Eat less salt.  Eat foods low in saturated fats and cholesterol. These are found in animal and  whole dairy products.  Eat more fiber. Fiber is found in whole grains, vegetables, and fruits.  Keep a healthy weight.  Stay active and exercise often.  This information is not intended to replace advice given to you by your health care provider. Make sure you discuss any questions you have with your health care provider. Document Released: 09/07/2012 Document Revised: 10/19/2015 Document Reviewed: 06/12/2012 Elsevier Interactive Patient Education  2017 [employer]  

## 2017-04-04 ENCOUNTER — Ambulatory Visit (INDEPENDENT_AMBULATORY_CARE_PROVIDER_SITE_OTHER): Payer: Self-pay | Admitting: Vascular Surgery

## 2017-04-30 DIAGNOSIS — I10 Essential (primary) hypertension: Secondary | ICD-10-CM | POA: Diagnosis not present

## 2017-04-30 DIAGNOSIS — F17200 Nicotine dependence, unspecified, uncomplicated: Secondary | ICD-10-CM | POA: Diagnosis not present

## 2017-04-30 DIAGNOSIS — I480 Paroxysmal atrial fibrillation: Secondary | ICD-10-CM | POA: Diagnosis not present

## 2017-04-30 DIAGNOSIS — I25810 Atherosclerosis of coronary artery bypass graft(s) without angina pectoris: Secondary | ICD-10-CM | POA: Diagnosis not present

## 2017-04-30 DIAGNOSIS — R739 Hyperglycemia, unspecified: Secondary | ICD-10-CM | POA: Diagnosis not present

## 2017-04-30 DIAGNOSIS — J432 Centrilobular emphysema: Secondary | ICD-10-CM | POA: Diagnosis not present

## 2017-04-30 DIAGNOSIS — R5383 Other fatigue: Secondary | ICD-10-CM | POA: Diagnosis not present

## 2017-04-30 DIAGNOSIS — N184 Chronic kidney disease, stage 4 (severe): Secondary | ICD-10-CM | POA: Diagnosis not present

## 2017-05-12 DIAGNOSIS — R809 Proteinuria, unspecified: Secondary | ICD-10-CM | POA: Diagnosis not present

## 2017-05-12 DIAGNOSIS — M109 Gout, unspecified: Secondary | ICD-10-CM | POA: Diagnosis not present

## 2017-05-12 DIAGNOSIS — I129 Hypertensive chronic kidney disease with stage 1 through stage 4 chronic kidney disease, or unspecified chronic kidney disease: Secondary | ICD-10-CM | POA: Diagnosis not present

## 2017-05-12 DIAGNOSIS — E559 Vitamin D deficiency, unspecified: Secondary | ICD-10-CM | POA: Diagnosis not present

## 2017-05-12 DIAGNOSIS — N184 Chronic kidney disease, stage 4 (severe): Secondary | ICD-10-CM | POA: Diagnosis not present

## 2017-05-12 DIAGNOSIS — N2581 Secondary hyperparathyroidism of renal origin: Secondary | ICD-10-CM | POA: Diagnosis not present

## 2017-05-27 DIAGNOSIS — Z992 Dependence on renal dialysis: Secondary | ICD-10-CM

## 2017-05-27 DIAGNOSIS — N189 Chronic kidney disease, unspecified: Secondary | ICD-10-CM

## 2017-05-27 HISTORY — DX: End stage renal disease: Z99.2

## 2017-05-27 HISTORY — DX: Chronic kidney disease, unspecified: N18.9

## 2017-06-02 ENCOUNTER — Other Ambulatory Visit: Payer: Self-pay | Admitting: Internal Medicine

## 2017-06-02 DIAGNOSIS — I10 Essential (primary) hypertension: Secondary | ICD-10-CM | POA: Diagnosis not present

## 2017-06-02 DIAGNOSIS — J432 Centrilobular emphysema: Secondary | ICD-10-CM | POA: Diagnosis not present

## 2017-06-02 DIAGNOSIS — I25810 Atherosclerosis of coronary artery bypass graft(s) without angina pectoris: Secondary | ICD-10-CM | POA: Diagnosis not present

## 2017-06-02 DIAGNOSIS — I480 Paroxysmal atrial fibrillation: Secondary | ICD-10-CM

## 2017-06-02 DIAGNOSIS — R911 Solitary pulmonary nodule: Secondary | ICD-10-CM

## 2017-06-02 DIAGNOSIS — E108 Type 1 diabetes mellitus with unspecified complications: Secondary | ICD-10-CM

## 2017-06-02 DIAGNOSIS — I739 Peripheral vascular disease, unspecified: Secondary | ICD-10-CM | POA: Diagnosis not present

## 2017-06-02 DIAGNOSIS — N183 Chronic kidney disease, stage 3 (moderate): Secondary | ICD-10-CM | POA: Diagnosis not present

## 2017-06-02 DIAGNOSIS — Z23 Encounter for immunization: Secondary | ICD-10-CM | POA: Diagnosis not present

## 2017-06-02 DIAGNOSIS — G4733 Obstructive sleep apnea (adult) (pediatric): Secondary | ICD-10-CM | POA: Diagnosis not present

## 2017-06-13 ENCOUNTER — Ambulatory Visit (INDEPENDENT_AMBULATORY_CARE_PROVIDER_SITE_OTHER): Payer: Medicare HMO | Admitting: Vascular Surgery

## 2017-06-13 ENCOUNTER — Encounter (INDEPENDENT_AMBULATORY_CARE_PROVIDER_SITE_OTHER): Payer: Self-pay | Admitting: Vascular Surgery

## 2017-06-13 ENCOUNTER — Other Ambulatory Visit (INDEPENDENT_AMBULATORY_CARE_PROVIDER_SITE_OTHER): Payer: Medicare HMO

## 2017-06-13 ENCOUNTER — Encounter (INDEPENDENT_AMBULATORY_CARE_PROVIDER_SITE_OTHER): Payer: Self-pay

## 2017-06-13 VITALS — BP 152/76 | HR 62 | Resp 16 | Wt 237.0 lb

## 2017-06-13 DIAGNOSIS — N184 Chronic kidney disease, stage 4 (severe): Secondary | ICD-10-CM | POA: Diagnosis not present

## 2017-06-13 DIAGNOSIS — E119 Type 2 diabetes mellitus without complications: Secondary | ICD-10-CM | POA: Diagnosis not present

## 2017-06-13 DIAGNOSIS — J449 Chronic obstructive pulmonary disease, unspecified: Secondary | ICD-10-CM

## 2017-06-13 DIAGNOSIS — I10 Essential (primary) hypertension: Secondary | ICD-10-CM

## 2017-06-13 DIAGNOSIS — I714 Abdominal aortic aneurysm, without rupture, unspecified: Secondary | ICD-10-CM

## 2017-06-13 NOTE — Progress Notes (Signed)
MRN : [id number]  Michelle Murillo is a 72 y.o. (November 18, 1945) female who presents with chief complaint of  Chief Complaint  Patient presents with  . Follow-up    3-20mo EVAR  .  History of Present Illness: Patient returns today in follow up of her AAA. She does have some intermittent abdominal pain.  No new back pain or peripheral embolization. Her aortic duplex today shows an endoleak with growth of her aneurysm sac now measuring 6.5 cm in maximal diameter.   Current Outpatient Prescriptions  Medication Sig Dispense Refill  . albuterol (PROVENTIL HFA) 108 (90 Base) MCG/ACT inhaler Inhale into the lungs.    Marland Kitchen allopurinol (ZYLOPRIM) 300 MG tablet     . aspirin EC 81 MG tablet Take 81 mg by mouth daily.    . benzonatate (TESSALON) 100 MG capsule     . colchicine 0.6 MG tablet Take by mouth.    . gabapentin (NEURONTIN) 300 MG capsule Take by mouth.    . losartan (COZAAR) 100 MG tablet TAKE 1 TABLET ONE TIME DAILY    . metoprolol tartrate (LOPRESSOR) 25 MG tablet     . omeprazole (PRILOSEC) 40 MG capsule TAKE 1 CAPSULE ONE TIME DAILY AS NEEDED    . Vitamin D, Ergocalciferol, (DRISDOL) 50000 units CAPS capsule     . furosemide (LASIX) 40 MG tablet TAKE 1 TABLET EVERY DAY    . Rivaroxaban (XARELTO STARTER PACK) 15 & 20 MG TBPK Take as directed on package: Start with one 15mg  tablet by mouth twice a day with food. On Day 22, switch to one 20mg  tablet once a day with food. (Patient not taking: Reported on 03/21/2017) 51 each 0   No current facility-administered medications for this visit.         Past Medical History:  Diagnosis Date  . Abdominal aneurysm ([place])   . Cancer ([place])   . CHF (congestive heart failure) ([place])   . COPD (chronic obstructive pulmonary disease) ([place])   . Diabetes mellitus without complication ([place])   . GERD (gastroesophageal reflux disease)   . Gout   . Hypertension          Past Surgical History:  Procedure  Laterality Date  . ABDOMINAL SURGERY    . BRONCHOSCOPY    . BYPASS GRAFT     4  . CARDIAC SURGERY      Social History      Social History  Substance Use Topics  . Smoking status: Current Every Day Smoker    Packs/day: 0.50    Types: Cigarettes  . Smokeless tobacco: Never Used  . Alcohol use No  lives alone  Family History No bleeding or clotting disorders No aneurysms, autoimmune diseases, or porphyria       Allergies  Allergen Reactions  . Bupropion Nausea Only    Other reaction(s): Other (See Comments) "I didn't do well with it."  . Morphine And Related Hives     REVIEW OF SYSTEMS (Negative unless checked)  Constitutional: [] Weight loss  [] Fever  [] Chills Cardiac: [] Chest pain   [] Chest pressure   [] Palpitations   [] Shortness of breath when laying flat   [] Shortness of breath at rest   [] Shortness of breath with exertion. Vascular:  [] Pain in legs with walking   [] Pain in legs at rest   [] Pain in legs when laying flat   [] Claudication   [] Pain in feet when walking  [] Pain in feet at rest  [] Pain in feet when laying flat   []   History of DVT   [] Phlebitis   [x] Swelling in legs   [] Varicose veins   [] Non-healing ulcers Pulmonary:   [] Uses home oxygen   [] Productive cough   [] Hemoptysis   [] Wheeze  [x] COPD   [] Asthma Neurologic:  [] Dizziness  [] Blackouts   [] Seizures   [] History of stroke   [] History of TIA  [] Aphasia   [] Temporary blindness   [] Dysphagia   [] Weakness or numbness in arms   [] Weakness or numbness in legs Musculoskeletal:  [x] Arthritis   [] Joint swelling   [x] Joint pain   [] Low back pain Hematologic:  [] Easy bruising  [] Easy bleeding   [] Hypercoagulable state   [] Anemic   Gastrointestinal:  [] Blood in stool   [] Vomiting blood  [x] Gastroesophageal reflux/heartburn   [x] Abdominal pain Genitourinary:  [x] Chronic kidney disease   [] Difficult urination  [] Frequent urination  [] Burning with urination   [] Hematuria Skin:  [] Rashes   [] Ulcers    [] Wounds Psychological:  [] History of anxiety   []  History of major depression.      Physical Examination  BP (!) 152/76 (BP Location: Right Arm)   Pulse 62   Resp 16   Wt 107.5 kg (237 lb)   BMI 39.44 kg/m  Gen:  WD/WN, NAD. obese Head: [place]/AT, No temporalis wasting. Ear/Nose/Throat: Hearing grossly intact, nares w/o erythema or drainage, trachea midline Eyes: Conjunctiva clear. Very poor visual acuity Neck: Supple.  No JVD.  Pulmonary:  Good air movement, no use of accessory muscles.  Cardiac: irregular Vascular:  Vessel Right Left  Radial Palpable Palpable                                   Gastrointestinal: soft, non-tender/non-distended. Aorta not easily palpable with habitus Musculoskeletal: M/S 5/5 throughout.  No deformity or atrophy.  Neurologic: Sensation grossly intact in extremities.  Symmetrical.  Speech is fluent.  Psychiatric: Judgment intact, Mood & affect appropriate for pt's clinical situation. Dermatologic: No rashes or ulcers noted.  No cellulitis or open wounds.       Labs No results found for this or any previous visit (from the past 2160 hour(s)).  Radiology No results found.    Assessment/Plan Hypertension blood pressure control important in reducing the progression of atherosclerotic disease. On appropriate oral medications.   COPD (chronic obstructive pulmonary disease) ([place]) Fairly significant and has been told she cannot have general anesthesia anymore.  Diabetes mellitus without complication (HCC) blood glucose control important in reducing the progression of atherosclerotic disease. Also, involved in wound healing. On appropriate medications.   Chronic kidney disease (CKD), stage IV (severe) (HCC) She has had decline of her renal function.  Normally, with the duplex findings we will get a CT angiogram but I do not think that would be prudent given her renal function.  This certainly complicates the situation   AAA  (abdominal aortic aneurysm) without rupture ([place]) Her aortic duplex today shows an endoleak with growth of her aneurysm sac now measuring 6.5 cm in maximal diameter.  With no ability to get a CT angiogram, I think the best course of action will be to do a catheter-based angiogram and try to limit our contrast as much as possible.  If a type II endoleak is encountered, we may go ahead and try to treat this at the same time.  If this is a type I or III endoleak, we would likely come back at a later date.  Patient voices her understanding and  is agreeable to proceed with our plan of care.  She understands this is a critical and serious situation and with the continuing sac growth, concern for rupture is present.    Leotis Pain, MD  06/13/2017 10:42 AM    This note was created with Dragon medical transcription system.  Any errors from dictation are purely unintentional

## 2017-06-13 NOTE — Assessment & Plan Note (Signed)
Her aortic duplex today shows an endoleak with growth of her aneurysm sac now measuring 6.5 cm in maximal diameter.  With no ability to get a CT angiogram, I think the best course of action will be to do a catheter-based angiogram and try to limit our contrast as much as possible.  If a type II endoleak is encountered, we may go ahead and try to treat this at the same time.  If this is a type I or III endoleak, we would likely come back at a later date.  Patient voices her understanding and is agreeable to proceed with our plan of care.  She understands this is a critical and serious situation and with the continuing sac growth, concern for rupture is present.

## 2017-06-13 NOTE — Patient Instructions (Signed)
Abdominal Aortic Aneurysm Blood pumps away from the heart through tubes (blood vessels) called arteries. Aneurysms are weak or damaged places in the wall of an artery. It bulges out like a balloon. An abdominal aortic aneurysm happens in the main artery of the body (aorta). It can burst or tear, causing bleeding inside the body. This is an emergency. It needs treatment right away. What are the causes? The exact cause is unknown. Things that could cause this problem include:  Fat and other substances building up in the lining of a tube.  Swelling of the walls of a blood vessel.  Certain tissue diseases.  Belly (abdominal) trauma.  An infection in the main artery of the body.  What increases the risk? There are things that make it more likely for you to have an aneurysm. These include:  Being over the age of 73 years old.  Having high blood pressure (hypertension).  Being a female.  Being white.  Being very overweight (obese).  Having a family history of aneurysm.  Using tobacco products.  What are the signs or symptoms? Symptoms depend on the size of the aneurysm and how fast it grows. There may not be symptoms. If symptoms occur, they can include:  Pain (belly, side, lower back, or groin).  Feeling full after eating a small amount of food.  Feeling sick to your stomach (nauseous), throwing up (vomiting), or both.  Feeling a lump in your belly that feels like it is beating (pulsating).  Feeling like you will pass out (faint).  How is this treated?  Medicine to control blood pressure and pain.  Imaging tests to see if the aneurysm gets bigger.  Surgery. How is this prevented? To lessen your chance of getting this condition:  Stop smoking. Stop chewing tobacco.  Limit or avoid alcohol.  Keep your blood pressure, blood sugar, and cholesterol within normal limits.  Eat less salt.  Eat foods low in saturated fats and cholesterol. These are found in animal and  whole dairy products.  Eat more fiber. Fiber is found in whole grains, vegetables, and fruits.  Keep a healthy weight.  Stay active and exercise often.  This information is not intended to replace advice given to you by your health care provider. Make sure you discuss any questions you have with your health care provider. Document Released: 09/07/2012 Document Revised: 10/19/2015 Document Reviewed: 06/12/2012 Elsevier Interactive Patient Education  2017 Elsevier Inc.  

## 2017-06-16 ENCOUNTER — Other Ambulatory Visit (INDEPENDENT_AMBULATORY_CARE_PROVIDER_SITE_OTHER): Payer: Self-pay | Admitting: Vascular Surgery

## 2017-06-20 ENCOUNTER — Encounter
Admission: RE | Admit: 2017-06-20 | Discharge: 2017-06-20 | Disposition: A | Discharge status: Home / Self Care | Payer: Medicare HMO | Source: Ambulatory Visit | Attending: Vascular Surgery | Admitting: Vascular Surgery

## 2017-06-20 DIAGNOSIS — I130 Hypertensive heart and chronic kidney disease with heart failure and stage 1 through stage 4 chronic kidney disease, or unspecified chronic kidney disease: Secondary | ICD-10-CM | POA: Diagnosis not present

## 2017-06-20 DIAGNOSIS — E1122 Type 2 diabetes mellitus with diabetic chronic kidney disease: Secondary | ICD-10-CM | POA: Diagnosis not present

## 2017-06-20 DIAGNOSIS — F17210 Nicotine dependence, cigarettes, uncomplicated: Secondary | ICD-10-CM | POA: Diagnosis not present

## 2017-06-20 DIAGNOSIS — Z859 Personal history of malignant neoplasm, unspecified: Secondary | ICD-10-CM | POA: Insufficient documentation

## 2017-06-20 DIAGNOSIS — I509 Heart failure, unspecified: Secondary | ICD-10-CM | POA: Insufficient documentation

## 2017-06-20 DIAGNOSIS — Z9889 Other specified postprocedural states: Secondary | ICD-10-CM | POA: Insufficient documentation

## 2017-06-20 DIAGNOSIS — M109 Gout, unspecified: Secondary | ICD-10-CM | POA: Insufficient documentation

## 2017-06-20 DIAGNOSIS — N184 Chronic kidney disease, stage 4 (severe): Secondary | ICD-10-CM | POA: Insufficient documentation

## 2017-06-20 DIAGNOSIS — Z885 Allergy status to narcotic agent status: Secondary | ICD-10-CM | POA: Insufficient documentation

## 2017-06-20 DIAGNOSIS — Z79899 Other long term (current) drug therapy: Secondary | ICD-10-CM | POA: Diagnosis not present

## 2017-06-20 DIAGNOSIS — J449 Chronic obstructive pulmonary disease, unspecified: Secondary | ICD-10-CM | POA: Diagnosis not present

## 2017-06-20 DIAGNOSIS — Z7902 Long term (current) use of antithrombotics/antiplatelets: Secondary | ICD-10-CM | POA: Insufficient documentation

## 2017-06-20 DIAGNOSIS — Z888 Allergy status to other drugs, medicaments and biological substances status: Secondary | ICD-10-CM | POA: Insufficient documentation

## 2017-06-20 DIAGNOSIS — K219 Gastro-esophageal reflux disease without esophagitis: Secondary | ICD-10-CM | POA: Insufficient documentation

## 2017-06-20 DIAGNOSIS — Z951 Presence of aortocoronary bypass graft: Secondary | ICD-10-CM | POA: Insufficient documentation

## 2017-06-20 DIAGNOSIS — I714 Abdominal aortic aneurysm, without rupture: Secondary | ICD-10-CM | POA: Insufficient documentation

## 2017-06-20 DIAGNOSIS — Z7982 Long term (current) use of aspirin: Secondary | ICD-10-CM | POA: Diagnosis not present

## 2017-06-20 HISTORY — DX: Legal blindness, as defined in USA: H54.8

## 2017-06-20 HISTORY — DX: Atherosclerotic heart disease of native coronary artery without angina pectoris: I25.10

## 2017-06-20 HISTORY — DX: Adverse effect of unspecified anesthetic, initial encounter: T41.45XA

## 2017-06-20 HISTORY — DX: Unspecified convulsions: R56.9

## 2017-06-20 HISTORY — DX: Chronic kidney disease, unspecified: N18.9

## 2017-06-20 LAB — CREATININE, SERUM
Creatinine, Ser: 2.23 mg/dL — ABNORMAL HIGH (ref 0.44–1.00)
GFR calc Af Amer: 24 mL/min — ABNORMAL LOW (ref >60)
GFR calc non Af Amer: 21 mL/min — ABNORMAL LOW (ref >60)

## 2017-06-20 LAB — BUN: BUN: 38 mg/dL — AB (ref 6–20)

## 2017-06-20 NOTE — Pre-Procedure Instructions (Signed)
Patient complaining of "feeling funny".  Feels like she is lightheaded and may pass out.  Laid her back in recliner.  Pulse = 72 and pulse ox = 98%.  Stomach is uncomfortable at all times.  Patient has not eaten today or taken her blood pressure medicine.  Rested for short while before getting her up to obtain blood work.

## 2017-06-22 MED ORDER — CEFAZOLIN SODIUM-DEXTROSE 2-4 GM/100ML-% IV SOLN: 2.0000 g | Freq: Once | INTRAVENOUS | Status: DC

## 2017-06-23 ENCOUNTER — Ambulatory Visit
Admission: RE | Admit: 2017-06-23 | Discharge: 2017-06-23 | Disposition: A | Discharge status: Home / Self Care | Payer: Medicare HMO | Source: Ambulatory Visit | Attending: Vascular Surgery | Admitting: Vascular Surgery

## 2017-06-23 ENCOUNTER — Encounter
Admission: RE | Disposition: A | Discharge status: Home / Self Care | Payer: Self-pay | Source: Ambulatory Visit | Attending: Vascular Surgery

## 2017-06-23 DIAGNOSIS — I509 Heart failure, unspecified: Secondary | ICD-10-CM | POA: Diagnosis not present

## 2017-06-23 DIAGNOSIS — I714 Abdominal aortic aneurysm, without rupture, unspecified: Secondary | ICD-10-CM

## 2017-06-23 DIAGNOSIS — I701 Atherosclerosis of renal artery: Secondary | ICD-10-CM | POA: Diagnosis not present

## 2017-06-23 DIAGNOSIS — N184 Chronic kidney disease, stage 4 (severe): Secondary | ICD-10-CM | POA: Diagnosis not present

## 2017-06-23 DIAGNOSIS — E1122 Type 2 diabetes mellitus with diabetic chronic kidney disease: Secondary | ICD-10-CM | POA: Diagnosis not present

## 2017-06-23 DIAGNOSIS — J449 Chronic obstructive pulmonary disease, unspecified: Secondary | ICD-10-CM | POA: Diagnosis not present

## 2017-06-23 DIAGNOSIS — F17210 Nicotine dependence, cigarettes, uncomplicated: Secondary | ICD-10-CM | POA: Diagnosis not present

## 2017-06-23 DIAGNOSIS — M109 Gout, unspecified: Secondary | ICD-10-CM | POA: Diagnosis not present

## 2017-06-23 DIAGNOSIS — I130 Hypertensive heart and chronic kidney disease with heart failure and stage 1 through stage 4 chronic kidney disease, or unspecified chronic kidney disease: Secondary | ICD-10-CM | POA: Diagnosis not present

## 2017-06-23 DIAGNOSIS — K219 Gastro-esophageal reflux disease without esophagitis: Secondary | ICD-10-CM | POA: Diagnosis not present

## 2017-06-23 HISTORY — PX: ABDOMINAL AORTOGRAM: CATH118222

## 2017-06-23 SURGERY — ABDOMINAL AORTOGRAM
Anesthesia: Moderate Sedation

## 2017-06-23 MED ORDER — FENTANYL CITRATE (PF) 100 MCG/2ML IJ SOLN
INTRAMUSCULAR | Status: DC | PRN
  Administered 2017-06-23 (×2): 25 ug via INTRAVENOUS
  Administered 2017-06-23: 50 ug via INTRAVENOUS

## 2017-06-23 MED ORDER — LIDOCAINE-EPINEPHRINE (PF) 1 %-1:200000 IJ SOLN
INTRAMUSCULAR | Status: DC | PRN
  Administered 2017-06-23: 10 mL via INTRADERMAL

## 2017-06-23 MED ORDER — LIDOCAINE-EPINEPHRINE (PF) 1 %-1:200000 IJ SOLN
INTRAMUSCULAR | Status: AC
  Filled 2017-06-23: qty 30

## 2017-06-23 MED ORDER — FAMOTIDINE 20 MG PO TABS: 40.0000 mg | ORAL_TABLET | ORAL | Status: DC | PRN

## 2017-06-23 MED ORDER — HEPARIN SODIUM (PORCINE) 1000 UNIT/ML IJ SOLN
INTRAMUSCULAR | Status: AC
  Filled 2017-06-23: qty 1

## 2017-06-23 MED ORDER — HEPARIN (PORCINE) IN NACL 2-0.9 UNIT/ML-% IJ SOLN
INTRAMUSCULAR | Status: AC
  Filled 2017-06-23: qty 1000

## 2017-06-23 MED ORDER — SODIUM CHLORIDE 0.9 % IV BOLUS (SEPSIS)
250.0000 mL | Freq: Once | INTRAVENOUS | Status: AC
  Administered 2017-06-23: 250 mL via INTRAVENOUS

## 2017-06-23 MED ORDER — HYDROMORPHONE HCL 1 MG/ML IJ SOLN: 1.0000 mg | Freq: Once | INTRAMUSCULAR | Status: DC | PRN

## 2017-06-23 MED ORDER — HYDROCOD POLST-CPM POLST ER 10-8 MG/5ML PO SUER
ORAL | Status: AC
  Filled 2017-06-23: qty 5

## 2017-06-23 MED ORDER — METHYLPREDNISOLONE SODIUM SUCC 125 MG IJ SOLR: 125.0000 mg | INTRAMUSCULAR | Status: DC | PRN

## 2017-06-23 MED ORDER — HEPARIN SODIUM (PORCINE) 1000 UNIT/ML IJ SOLN
INTRAMUSCULAR | Status: DC | PRN
  Administered 2017-06-23: 3000 [IU] via INTRAVENOUS

## 2017-06-23 MED ORDER — IOPAMIDOL (ISOVUE-300) INJECTION 61%
INTRAVENOUS | Status: DC | PRN
  Administered 2017-06-23: 60 mL via INTRAVENOUS

## 2017-06-23 MED ORDER — SODIUM CHLORIDE 0.9 % IV SOLN
INTRAVENOUS | Status: DC
  Administered 2017-06-23: 75 mL via INTRAVENOUS
  Administered 2017-06-23: 08:00:00 via INTRAVENOUS

## 2017-06-23 MED ORDER — HYDROCOD POLST-CPM POLST ER 10-8 MG/5ML PO SUER
5.0000 mL | Freq: Once | ORAL | Status: AC
  Administered 2017-06-23: 5 mL via ORAL

## 2017-06-23 MED ORDER — MIDAZOLAM HCL 2 MG/2ML IJ SOLN
INTRAMUSCULAR | Status: DC | PRN
  Administered 2017-06-23 (×2): 1 mg via INTRAVENOUS
  Administered 2017-06-23: 2 mg via INTRAVENOUS

## 2017-06-23 MED ORDER — ONDANSETRON HCL 4 MG/2ML IJ SOLN: 4.0000 mg | Freq: Four times a day (QID) | INTRAMUSCULAR | Status: DC | PRN

## 2017-06-23 MED ORDER — FENTANYL CITRATE (PF) 100 MCG/2ML IJ SOLN
INTRAMUSCULAR | Status: AC
  Filled 2017-06-23: qty 2

## 2017-06-23 MED ORDER — CEFAZOLIN SODIUM-DEXTROSE 2-4 GM/100ML-% IV SOLN
2.0000 g | Freq: Once | INTRAVENOUS | Status: AC
  Administered 2017-06-23: 2 g via INTRAVENOUS

## 2017-06-23 MED ORDER — MIDAZOLAM HCL 5 MG/5ML IJ SOLN
INTRAMUSCULAR | Status: AC
  Filled 2017-06-23: qty 5

## 2017-06-23 SURGICAL SUPPLY — 12 items
CATH IMAGER II S 5FR 65CM (MISCELLANEOUS) ×2 IMPLANT
CATH PIG 70CM (CATHETERS) ×2 IMPLANT
CATH VS15FR (CATHETERS) ×2 IMPLANT
DEVICE STARCLOSE SE CLOSURE (Vascular Products) ×2 IMPLANT
DEVICE TORQUE (MISCELLANEOUS) ×2 IMPLANT
GLIDECATH 4FR STR (CATHETERS) ×2 IMPLANT
GUIDEWIRE ZIPWIRE .035X180CM (WIRE) ×2 IMPLANT
PACK ANGIOGRAPHY (CUSTOM PROCEDURE TRAY) ×2 IMPLANT
SHEATH BRITE TIP 5FRX11 (SHEATH) ×2 IMPLANT
SYR MEDRAD MARK V 150ML (SYRINGE) ×2 IMPLANT
TUBING CONTRAST HIGH PRESS 72 (TUBING) ×2 IMPLANT
WIRE J 3MM .035X145CM (WIRE) ×2 IMPLANT

## 2017-06-23 NOTE — H&P (Signed)
[facility] History & Physical Update  The patient was interviewed and re-examined.  The patient's previous History and Physical has been reviewed and is unchanged.  There is no change in the plan of care. We plan to proceed with the scheduled procedure.  Leotis Pain, MD  06/23/2017, 9:05 AM

## 2017-06-23 NOTE — Op Note (Signed)
[facility] Percutaneous Study/Intervention Procedural Note   Date of Surgery: 06/23/2017  Surgeon(s): Leotis Pain  Assistants:none  Pre-operative Diagnosis: AAA suspected endoleak on duplex, chronic kidney disease precluding infused CT scan  Post-operative diagnosis: Same  Procedure(s) Performed: 1. Ultrasound guidance for vascular access left femoral artery 2. Catheter placement into primary left hypogastric artery branches and superior mesenteric artery in the mid to distal segment after primary and secondary branches 3. Aortogram and selective left hypogastric artery and superior mesenteric artery angiogram 4. StarClose closure device left femoral artery  EBL: 10 cc  Contrast: 60 cc  Fluoro Time: 5 min  Moderate Conscious Sedation time: approximately 30 minutes using 4 mg of Versed and 100 Mcg of Fentanyl  Indications: Patient is a 72 y.o. female with a previous history of AAA treatment. The patient has noninvasive study showing possible endoleak in the sac and potential sac growth. The patient is brought in for angiography for further evaluation and potential treatment. Risks and benefits are discussed and informed consent is obtained  Procedure: The patient was identified and appropriate procedural time out was performed. The patient was then placed supine on the table and prepped and draped in the usual sterile fashion. Moderate conscious sedation was administered throughout the procedure with a face to face encounter with the patient throughout and with my supervision of the RN administering medicines and monitoring the patients vital signs and mental status throughout from the start of the procedure until the patient was taken to the recovery room.  Ultrasound was used to evaluate the left common femoral artery. It was patent . A digital ultrasound image was acquired. A Seldinger  needle was used to access the left common femoral artery under direct ultrasound guidance and a permanent image was performed. A 0.035 J wire was advanced without resistance and a 5Fr sheath was placed. Pigtail catheter was placed into the aorta and an AP aortogram was performed. This demonstrated moderate stenosis of the left renal artery and a patent right renal artery.  The aortic stent graft was in place with good flow through both limbs of the stent graft.  No type I or type III endoleak was identified on the initial aortogram.  Also, on the initial aortogram this type II endoleak was identified.  I pulled the pigtail catheter down into the left limb of the aorta to try to evaluate for a type III endoleak as well as flow into the left hypogastric artery better, and again brisk flow was seen with no endoleak.  I then exchanged for a VS 1 catheter and selectively cannulated the left hypogastric artery.  This was advanced beyond the initial branches of the hypogastric artery with imaging performed at this location as well as pulling back into the origin of the hypogastric artery to evaluate all branches.  Several different oblique angles were taken to evaluate left hypogastric artery.  There was normal flow in the left hypogastric artery and branches without an obvious endoleak into the aneurysm sac identified on selective imaging.  I then advanced to the S1 catheter into the superior mesenteric artery and selectively cannulated this.  Initial imaging from the most proximal superior mesenteric artery showed normal superior mesenteric artery flow in branches without any obvious filling into the sac to cause an endoleak.  To gain more detailed evaluation, I used a zip wire and advanced a 5 [place] glide catheter into the mid to distal superior mesenteric artery beyond the primary branches and even the second  and third branches.  Selective imaging this peer mesenteric artery and several different oblique angles  including a lateral ankle were performed.  Normal flow within the SMA without stenosis and no evidence of a branch feeding into the aneurysm sac causing any endoleak was seen. I elected to terminate the procedure. The sheath was removed and StarClose closure device was deployed in the left femoral artery with excellent hemostatic result. The patient was taken to the recovery room in stable condition having tolerated the procedure well.  Findings:  Aortogram: This demonstrated moderate stenosis of the left renal artery and a patent right renal artery.  The aortic stent graft was in place with good flow through both limbs of the stent graft.  No type I or type III endoleak was identified on the initial aortogram.  Also, on the initial aortogram this type II endoleak was identified.  I pulled the pigtail catheter down into the left limb of the aorta to try to evaluate for a type III endoleak as well as flow into the left hypogastric artery better, and again brisk flow was seen with no endoleak.  Left hypogastric artery: Normal flow in the left hypogastric artery and branches without an obvious endoleak into the aneurysm sac identified on selective imaging  Superior mesenteric artery: Normal flow within the SMA without stenosis and no evidence of a branch feeding into the aneurysm sac causing any endoleak was seen.    Disposition: Patient was taken to the recovery room in stable condition having tolerated the procedure well.  Complications: None  Leotis Pain 06/23/2017 10:09 AM     This note was created with Dragon Medical transcription system. Any errors in dictation are purely unintentional.

## 2017-06-23 NOTE — Discharge Instructions (Signed)
Moderate Conscious Sedation, Adult, Care After These instructions provide you with information about caring for yourself after your procedure. Your health care provider may also give you more specific instructions. Your treatment has been planned according to current medical practices, but problems sometimes occur. Call your health care provider if you have any problems or questions after your procedure. What can I expect after the procedure? After your procedure, it is common:  To feel sleepy for several hours.  To feel clumsy and have poor balance for several hours.  To have poor judgment for several hours.  To vomit if you eat too soon.  Follow these instructions at home: For at least 24 hours after the procedure:   Do not: ? Participate in activities where you could fall or become injured. ? Drive. ? Use heavy machinery. ? Drink alcohol. ? Take sleeping pills or medicines that cause drowsiness. ? Make important decisions or sign legal documents. ? Take care of children on your own.  Rest. Eating and drinking  Follow the diet recommended by your health care provider.  If you vomit: ? Drink water, juice, or soup when you can drink without vomiting. ? Make sure you have little or no nausea before eating solid foods. General instructions  Have a responsible adult stay with you until you are awake and alert.  Take over-the-counter and prescription medicines only as told by your health care provider.  If you smoke, do not smoke without supervision.  Keep all follow-up visits as told by your health care provider. This is important. Contact a health care provider if:  You keep feeling nauseous or you keep vomiting.  You feel light-headed.  You develop a rash.  You have a fever. Get help right away if:  You have trouble breathing. This information is not intended to replace advice given to you by your health care provider. Make sure you discuss any questions you have  with your health care provider. Document Released: 03/03/2013 Document Revised: 10/16/2015 Document Reviewed: 09/02/2015 Elsevier Interactive Patient Education  2018 [place] After This sheet gives you information about how to care for yourself after your procedure. Your health care provider may also give you more specific instructions. If you have problems or questions, contact your health care provider. What can I expect after the procedure? After the procedure, it is common to have bruising and tenderness at the catheter insertion area. Follow these instructions at home: Insertion site care  Follow instructions from your health care provider about how to take care of your insertion site. Make sure you: ? Wash your hands with soap and water before you change your bandage (dressing). If soap and water are not available, use hand sanitizer. ? Change your dressing as told by your health care provider. ? Leave stitches (sutures), skin glue, or adhesive strips in place. These skin closures may need to stay in place for 2 weeks or longer. If adhesive strip edges start to loosen and curl up, you may trim the loose edges. Do not remove adhesive strips completely unless your health care provider tells you to do that.  Do not take baths, swim, or use a hot tub until your health care provider approves.  You may shower 24-48 hours after the procedure or as told by your health care provider. ? Gently wash the site with plain soap and water. ? Pat the area dry with a clean towel. ? Do not rub the site. This may cause bleeding.  Do  not apply powder or lotion to the site. Keep the site clean and dry.  Check your insertion site every day for signs of infection. Check for: ? Redness, swelling, or pain. ? Fluid or blood. ? Warmth. ? Pus or a bad smell. Activity  Rest as told by your health care provider, usually for 1-2 days.  Do not lift anything that is heavier than 10 lbs.  (4.5 kg) or as told by your health care provider.  Do not drive for 24 hours if you were given a medicine to help you relax (sedative).  Do not drive or use heavy machinery while taking prescription pain medicine. General instructions  Return to your normal activities as told by your health care provider, usually in about a week. Ask your health care provider what activities are safe for you.  If the catheter site starts bleeding, lie flat and put pressure on the site. If the bleeding does not stop, get help right away. This is a medical emergency.  Drink enough fluid to keep your urine clear or pale yellow. This helps flush the contrast dye from your body.  Take over-the-counter and prescription medicines only as told by your health care provider.  Keep all follow-up visits as told by your health care provider. This is important. Contact a health care provider if:  You have a fever or chills.  You have redness, swelling, or pain around your insertion site.  You have fluid or blood coming from your insertion site.  The insertion site feels warm to the touch.  You have pus or a bad smell coming from your insertion site.  You have bruising around the insertion site.  You notice blood collecting in the tissue around the catheter site (hematoma). The hematoma may be painful to the touch. Get help right away if:  You have severe pain at the catheter insertion area.  The catheter insertion area swells very fast.  The catheter insertion area is bleeding, and the bleeding does not stop when you hold steady pressure on the area.  The area near or just beyond the catheter insertion site becomes pale, cool, tingly, or numb. These symptoms may represent a serious problem that is an emergency. Do not wait to see if the symptoms will go away. Get medical help right away. Call your local emergency services (911 in the [place] Do not drive yourself to the hospital. Summary  After the  procedure, it is common to have bruising and tenderness at the catheter insertion area.  After the procedure, it is important to rest and drink plenty of fluids.  Do not take baths, swim, or use a hot tub until your health care provider says it is okay to do so. You may shower 24-48 hours after the procedure or as told by your health care provider.  If the catheter site starts bleeding, lie flat and put pressure on the site. If the bleeding does not stop, get help right away. This is a medical emergency. This information is not intended to replace advice given to you by your health care provider. Make sure you discuss any questions you have with your health care provider. Document Released: 11/29/2004 Document Revised: 04/17/2016 Document Reviewed: 04/17/2016 Elsevier Interactive Patient Education  [employer].

## 2017-08-05 ENCOUNTER — Ambulatory Visit (INDEPENDENT_AMBULATORY_CARE_PROVIDER_SITE_OTHER): Payer: Medicare HMO | Admitting: Vascular Surgery

## 2017-08-05 ENCOUNTER — Encounter (INDEPENDENT_AMBULATORY_CARE_PROVIDER_SITE_OTHER): Payer: Self-pay | Admitting: Vascular Surgery

## 2017-08-05 VITALS — BP 143/76 | HR 72 | Resp 16 | Wt 238.6 lb

## 2017-08-05 DIAGNOSIS — I714 Abdominal aortic aneurysm, without rupture, unspecified: Secondary | ICD-10-CM

## 2017-08-05 DIAGNOSIS — E119 Type 2 diabetes mellitus without complications: Secondary | ICD-10-CM | POA: Diagnosis not present

## 2017-08-05 DIAGNOSIS — J449 Chronic obstructive pulmonary disease, unspecified: Secondary | ICD-10-CM | POA: Diagnosis not present

## 2017-08-05 DIAGNOSIS — I10 Essential (primary) hypertension: Secondary | ICD-10-CM | POA: Diagnosis not present

## 2017-08-05 NOTE — Assessment & Plan Note (Signed)
We have been having a difficult time seeing her aortic aneurysm with duplex.  Her body habitus is difficult and bowel gas has been limiting.  We will obtain an uninfused CT scan in 3-4 months to check the size.  She will call our office with any problems in the interim.  The findings of her recent angiogram for endoleak evaluation were encouraging.

## 2017-08-05 NOTE — Patient Instructions (Signed)
Abdominal Aortic Aneurysm Blood pumps away from the heart through tubes (blood vessels) called arteries. Aneurysms are weak or damaged places in the wall of an artery. It bulges out like a balloon. An abdominal aortic aneurysm happens in the main artery of the body (aorta). It can burst or tear, causing bleeding inside the body. This is an emergency. It needs treatment right away. What are the causes? The exact cause is unknown. Things that could cause this problem include:  Fat and other substances building up in the lining of a tube.  Swelling of the walls of a blood vessel.  Certain tissue diseases.  Belly (abdominal) trauma.  An infection in the main artery of the body.  What increases the risk? There are things that make it more likely for you to have an aneurysm. These include:  Being over the age of 73 years old.  Having high blood pressure (hypertension).  Being a female.  Being white.  Being very overweight (obese).  Having a family history of aneurysm.  Using tobacco products.  What are the signs or symptoms? Symptoms depend on the size of the aneurysm and how fast it grows. There may not be symptoms. If symptoms occur, they can include:  Pain (belly, side, lower back, or groin).  Feeling full after eating a small amount of food.  Feeling sick to your stomach (nauseous), throwing up (vomiting), or both.  Feeling a lump in your belly that feels like it is beating (pulsating).  Feeling like you will pass out (faint).  How is this treated?  Medicine to control blood pressure and pain.  Imaging tests to see if the aneurysm gets bigger.  Surgery. How is this prevented? To lessen your chance of getting this condition:  Stop smoking. Stop chewing tobacco.  Limit or avoid alcohol.  Keep your blood pressure, blood sugar, and cholesterol within normal limits.  Eat less salt.  Eat foods low in saturated fats and cholesterol. These are found in animal and  whole dairy products.  Eat more fiber. Fiber is found in whole grains, vegetables, and fruits.  Keep a healthy weight.  Stay active and exercise often.  This information is not intended to replace advice given to you by your health care provider. Make sure you discuss any questions you have with your health care provider. Document Released: 09/07/2012 Document Revised: 10/19/2015 Document Reviewed: 06/12/2012 Elsevier Interactive Patient Education  2017 Elsevier Inc.  

## 2017-08-05 NOTE — Progress Notes (Signed)
MRN : [id number]  Michelle Murillo is a 72 y.o. (1946-02-23) female who presents with chief complaint of  Chief Complaint  Patient presents with  . Follow-up    [facility] 6week follow up  .  History of Present Illness: Patient returns today in follow up of her abdominal aortic and iliac artery aneurysms.  Her ultrasound had suggested a significant growth, but this was not confirmed with an angiogram and endoleak evaluation a few weeks ago.  The aneurysm did not appear that large and no obvious endoleak was seen.  The patient had no periprocedural complications.  She is doing well today.  Current Outpatient Medications  Medication Sig Dispense Refill  . allopurinol (ZYLOPRIM) 300 MG tablet Take 300 mg by mouth 3 (three) times a week.     Marland Kitchen aspirin EC 81 MG tablet Take 81 mg by mouth daily.    . colchicine 0.6 MG tablet Take 0.6 mg by mouth daily as needed (for gout attack/flare ups).     . furosemide (LASIX) 40 MG tablet Take 40 mg by mouth daily as needed (for fluid retention (swollen ankles)---typically no more than twice a week).     Marland Kitchen HYDROcodone-acetaminophen (NORCO/VICODIN) 5-325 MG tablet Take 1 tablet by mouth 2 (two) times daily as needed. For severe pain.    Marland Kitchen losartan (COZAAR) 100 MG tablet Take 100 mg by mouth daily.    . metoprolol tartrate (LOPRESSOR) 25 MG tablet Take 25 mg by mouth 2 (two) times daily.     Marland Kitchen omeprazole (PRILOSEC) 40 MG capsule Take 40 mg by mouth daily as needed (for acid reflux/indigestion.).    Marland Kitchen Vitamin D, Ergocalciferol, (DRISDOL) 50000 units CAPS capsule Take 50,000 Units by mouth every 30 (thirty) days.      No current facility-administered medications for this visit.     Past Medical History:  Diagnosis Date  . Abdominal aneurysm ([place])   . 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 CHF (congestive heart failure) ([place])   . Chronic kidney disease 2019   stage IV chronic kidney disease. now functions at 35%    . Complication of anesthesia 2013   "stops breathing and goes into convulsions".  pt was in a coma for 18 days after this.(during CABG)  . Complication of anesthesia    dr. Doris Cheadle is attending provider. surgery was at [facility]  . COPD (chronic obstructive pulmonary disease) ([place])   . Coronary artery disease   . Diabetes mellitus without complication (HCC)    borderline, does not take any medication  . GERD (gastroesophageal reflux disease)   . Gout   . Hypertension   . Legally blind    unable to see any more than shapes  . Seizures ([place]) 2013   only happened during cabg procedure while on the table    Past Surgical History:  Procedure Laterality Date  . ABDOMINAL AORTOGRAM N/A 06/23/2017   Procedure: ABDOMINAL AORTOGRAM;  Surgeon: Algernon Huxley, MD;  Location: [facility];  Service: Cardiovascular;  Laterality: N/A;  . ABDOMINAL SURGERY  2017   aneurysm repair with dr. Regis Hinton  . BRONCHOSCOPY  2015   done at [facility]. carcinoid of airways  . BYPASS GRAFT  2013   cabg x 4  . CARDIAC SURGERY  2013   cabg  . CHOLECYSTECTOMY     age 21  . CORONARY ARTERY BYPASS GRAFT      Social History  Social History  Substance Use Topics  . Smoking status: Current Every Day Smoker    Packs/day: 0.50    Types: Cigarettes  . Smokeless tobacco: Never Used  . Alcohol use No  lives alone  Family History No bleeding or clotting disorders No aneurysms, autoimmune diseases, or porphyria       Allergies  Allergen Reactions  . Bupropion Nausea Only    Other reaction(s): Other (See Comments) "I didn't do well with it."  . Morphine And Related Hives     REVIEW OF SYSTEMS(Negative unless checked)  Constitutional: '[]' Weight loss'[]' Fever'[]' Chills Cardiac:'[]' Chest pain'[]' Chest pressure'[]' Palpitations '[]' Shortness of breath when laying flat '[]' Shortness of breath at rest '[]' Shortness of breath with exertion. Vascular: '[]' Pain in legs with walking'[]' Pain in  legsat rest'[]' Pain in legs when laying flat '[]' Claudication '[]' Pain in feet when walking '[]' Pain in feet at rest '[]' Pain in feet when laying flat '[]' History of DVT '[]' Phlebitis '[x]' Swelling in legs '[]' Varicose veins '[]' Non-healing ulcers Pulmonary: '[]' Uses home oxygen '[]' Productive cough'[]' Hemoptysis '[]' Wheeze '[x]' COPD '[]' Asthma Neurologic: '[]' Dizziness '[]' Blackouts '[]' Seizures '[]' History of stroke '[]' History of TIA'[]' Aphasia '[]' Temporary blindness'[]' Dysphagia '[]' Weaknessor numbness in arms '[]' Weakness or numbnessin legs Musculoskeletal: '[x]' Arthritis '[]' Joint swelling '[x]' Joint pain '[]' Low back pain Hematologic:'[]' Easy bruising'[]' Easy bleeding '[]' Hypercoagulable state '[]' Anemic  Gastrointestinal:'[]' Blood in stool'[]' Vomiting blood'[x]' Gastroesophageal reflux/heartburn'[x]' Abdominal pain Genitourinary: '[x]' Chronic kidney disease '[]' Difficulturination '[]' Frequenturination '[]' Burning with urination'[]' Hematuria Skin: '[]' Rashes '[]' Ulcers '[]' Wounds Psychological: '[]' History of anxiety'[]' History of major depression.     Physical Examination  BP (!) 143/76 (BP Location: Right Arm)   Pulse 72   Resp 16   Wt 108.2 kg (238 lb 9.6 oz)   BMI 39.71 kg/m  Gen:  WD/WN, NAD Head: [place]/AT, No temporalis wasting. Ear/Nose/Throat: Hearing somewhat diminished, nares w/o erythema or drainage, trachea midline Eyes: Conjunctiva clear. Sclera non-icteric. Vision very poor Neck: Supple.  No JVD.  Pulmonary:  Good air movement, no use of accessory muscles.  Cardiac: irregular Vascular:  Vessel Right Left  Radial Palpable Palpable                                   Gastrointestinal: soft, non-tender/non-distended.  Aorta not palpable due to body habitus Musculoskeletal: M/S 5/5 throughout.  No deformity or atrophy. Mild LE edema. Neurologic: Sensation grossly intact in extremities.  Symmetrical.  Speech is fluent.  Psychiatric: Judgment intact, Mood &  affect appropriate for pt's clinical situation. Dermatologic: No rashes or ulcers noted.  No cellulitis or open wounds.       Labs Recent Results (from the past 2160 hour(s))  BUN     Status: Abnormal   Collection Time: 06/20/17  9:17 AM  Result Value Ref Range   BUN 38 (H) 6 - 20 mg/dL    Comment: Performed at [facility], [address] [place], [place] [address]  Creatinine, serum     Status: Abnormal   Collection Time: 06/20/17  9:17 AM  Result Value Ref Range   Creatinine, Ser 2.23 (H) 0.44 - 1.00 mg/dL   GFR calc non Af Amer 21 (L) >60 mL/min   GFR calc Af Amer 24 (L) >60 mL/min    Comment: (NOTE) The eGFR has been calculated using the CKD EPI equation. This calculation has not been validated in all clinical situations. eGFR's persistently <60 mL/min signify possible Chronic Kidney Disease. Performed at [facility], [address], [place], [place] [address]     Radiology No results found.   Assessment/Plan Hypertension blood pressure control important in reducing the progression of  atherosclerotic disease. On appropriate oral medications.   COPD (chronic obstructive pulmonary disease) ([place]) Fairly significant and has been told she cannot have general anesthesia anymore.  Diabetes mellitus without complication (HCC) blood glucose control important in reducing the progression of atherosclerotic disease. Also, involved in wound healing. On appropriate medications.    AAA (abdominal aortic aneurysm) without rupture ([place]) We have been having a difficult time seeing her aortic aneurysm with duplex.  Her body habitus is difficult and bowel gas has been limiting.  We will obtain an uninfused CT scan in 3-4 months to check the size.  She will call our office with any problems in the interim.  The findings of her recent angiogram for endoleak evaluation were encouraging.    Leotis Pain, MD  08/05/2017 11:33 AM    This note was created  with Dragon medical transcription system.  Any errors from dictation are purely unintentional

## 2017-08-11 DIAGNOSIS — N2581 Secondary hyperparathyroidism of renal origin: Secondary | ICD-10-CM | POA: Diagnosis not present

## 2017-08-11 DIAGNOSIS — N184 Chronic kidney disease, stage 4 (severe): Secondary | ICD-10-CM | POA: Diagnosis not present

## 2017-08-11 DIAGNOSIS — R809 Proteinuria, unspecified: Secondary | ICD-10-CM | POA: Diagnosis not present

## 2017-08-11 DIAGNOSIS — I129 Hypertensive chronic kidney disease with stage 1 through stage 4 chronic kidney disease, or unspecified chronic kidney disease: Secondary | ICD-10-CM | POA: Diagnosis not present

## 2017-08-11 DIAGNOSIS — N183 Chronic kidney disease, stage 3 (moderate): Secondary | ICD-10-CM | POA: Diagnosis not present

## 2017-08-11 DIAGNOSIS — M109 Gout, unspecified: Secondary | ICD-10-CM | POA: Diagnosis not present

## 2017-08-11 DIAGNOSIS — E875 Hyperkalemia: Secondary | ICD-10-CM | POA: Diagnosis not present

## 2017-08-15 ENCOUNTER — Telehealth (INDEPENDENT_AMBULATORY_CARE_PROVIDER_SITE_OTHER): Payer: Self-pay

## 2017-08-15 NOTE — Telephone Encounter (Signed)
Cary from the hospital called to ask if the patients procedure scheduled for today, should this in fact be WITHOUT contrast, because this particular procedure you normally schedule it WIT contrast?

## 2017-08-21 NOTE — Telephone Encounter (Signed)
Called Michelle Murillo to let her know that Dr. Lucky Cowboy did want the patient to have the procedure WITHOUT contrast due to very poor renal function.

## 2017-08-21 NOTE — Telephone Encounter (Signed)
Should the patient still drink the oral solution? Jeani Hawking said that this usually doesn't affect the patient's renal function, but she wants to be certain on what to tell the patient when she calls her.

## 2017-08-22 NOTE — Telephone Encounter (Signed)
Called Cary back to let her know that per Dr. Lucky Cowboy, he does not want the patient to have the oral contrast either, he only wants her aneurysm measured.

## 2017-08-25 ENCOUNTER — Ambulatory Visit
Admission: RE | Admit: 2017-08-25 | Discharge: 2017-08-25 | Disposition: A | Discharge status: Home / Self Care | Payer: Medicare HMO | Source: Ambulatory Visit | Attending: Vascular Surgery | Admitting: Vascular Surgery

## 2017-08-25 DIAGNOSIS — R911 Solitary pulmonary nodule: Secondary | ICD-10-CM

## 2017-08-25 DIAGNOSIS — I723 Aneurysm of iliac artery: Secondary | ICD-10-CM | POA: Insufficient documentation

## 2017-08-25 DIAGNOSIS — N200 Calculus of kidney: Secondary | ICD-10-CM | POA: Diagnosis not present

## 2017-08-25 DIAGNOSIS — I714 Abdominal aortic aneurysm, without rupture, unspecified: Secondary | ICD-10-CM

## 2017-08-25 DIAGNOSIS — I700 Atherosclerosis of aorta: Secondary | ICD-10-CM | POA: Insufficient documentation

## 2017-08-25 DIAGNOSIS — K5730 Diverticulosis of large intestine without perforation or abscess without bleeding: Secondary | ICD-10-CM | POA: Diagnosis not present

## 2017-08-25 DIAGNOSIS — I10 Essential (primary) hypertension: Secondary | ICD-10-CM

## 2017-08-25 DIAGNOSIS — Z95828 Presence of other vascular implants and grafts: Secondary | ICD-10-CM | POA: Insufficient documentation

## 2017-08-25 DIAGNOSIS — E108 Type 1 diabetes mellitus with unspecified complications: Secondary | ICD-10-CM

## 2017-08-25 DIAGNOSIS — I480 Paroxysmal atrial fibrillation: Secondary | ICD-10-CM

## 2017-08-27 ENCOUNTER — Telehealth (INDEPENDENT_AMBULATORY_CARE_PROVIDER_SITE_OTHER): Payer: Self-pay

## 2017-08-27 NOTE — Telephone Encounter (Signed)
Patient called the nurses line to schedule a follow up visit to receive her CT results.  Her information was given to [name] to get her scheduled to see Dr. Lucky Cowboy.

## 2017-09-15 ENCOUNTER — Telehealth (INDEPENDENT_AMBULATORY_CARE_PROVIDER_SITE_OTHER): Payer: Self-pay | Admitting: Vascular Surgery

## 2017-09-15 NOTE — Telephone Encounter (Signed)
I attempted to call and left a message informing the patient to call the office the back

## 2017-09-16 ENCOUNTER — Ambulatory Visit (INDEPENDENT_AMBULATORY_CARE_PROVIDER_SITE_OTHER): Payer: Medicare HMO | Admitting: Vascular Surgery

## 2017-09-19 DIAGNOSIS — N183 Chronic kidney disease, stage 3 (moderate): Secondary | ICD-10-CM | POA: Diagnosis not present

## 2017-09-19 DIAGNOSIS — E1121 Type 2 diabetes mellitus with diabetic nephropathy: Secondary | ICD-10-CM | POA: Diagnosis not present

## 2017-09-19 DIAGNOSIS — I714 Abdominal aortic aneurysm, without rupture: Secondary | ICD-10-CM | POA: Diagnosis not present

## 2017-09-19 DIAGNOSIS — I480 Paroxysmal atrial fibrillation: Secondary | ICD-10-CM | POA: Diagnosis not present

## 2017-09-19 DIAGNOSIS — I10 Essential (primary) hypertension: Secondary | ICD-10-CM | POA: Diagnosis not present

## 2017-09-19 DIAGNOSIS — I25719 Atherosclerosis of autologous vein coronary artery bypass graft(s) with unspecified angina pectoris: Secondary | ICD-10-CM | POA: Diagnosis not present

## 2017-09-25 DIAGNOSIS — Z720 Tobacco use: Secondary | ICD-10-CM | POA: Diagnosis not present

## 2017-09-25 DIAGNOSIS — Z0000 Encounter for general adult medical examination without abnormal findings: Secondary | ICD-10-CM | POA: Diagnosis not present

## 2017-09-25 DIAGNOSIS — R569 Unspecified convulsions: Secondary | ICD-10-CM | POA: Diagnosis not present

## 2017-09-25 DIAGNOSIS — I25810 Atherosclerosis of coronary artery bypass graft(s) without angina pectoris: Secondary | ICD-10-CM | POA: Diagnosis not present

## 2017-09-25 DIAGNOSIS — E1121 Type 2 diabetes mellitus with diabetic nephropathy: Secondary | ICD-10-CM | POA: Diagnosis not present

## 2017-09-25 DIAGNOSIS — N183 Chronic kidney disease, stage 3 (moderate): Secondary | ICD-10-CM | POA: Diagnosis not present

## 2017-09-25 DIAGNOSIS — J432 Centrilobular emphysema: Secondary | ICD-10-CM | POA: Diagnosis not present

## 2017-09-25 DIAGNOSIS — I10 Essential (primary) hypertension: Secondary | ICD-10-CM | POA: Diagnosis not present

## 2017-12-01 DIAGNOSIS — M109 Gout, unspecified: Secondary | ICD-10-CM | POA: Diagnosis not present

## 2017-12-01 DIAGNOSIS — E1122 Type 2 diabetes mellitus with diabetic chronic kidney disease: Secondary | ICD-10-CM | POA: Diagnosis not present

## 2017-12-01 DIAGNOSIS — N184 Chronic kidney disease, stage 4 (severe): Secondary | ICD-10-CM | POA: Diagnosis not present

## 2017-12-01 DIAGNOSIS — R601 Generalized edema: Secondary | ICD-10-CM | POA: Diagnosis not present

## 2017-12-01 DIAGNOSIS — I129 Hypertensive chronic kidney disease with stage 1 through stage 4 chronic kidney disease, or unspecified chronic kidney disease: Secondary | ICD-10-CM | POA: Diagnosis not present

## 2017-12-01 DIAGNOSIS — R809 Proteinuria, unspecified: Secondary | ICD-10-CM | POA: Diagnosis not present

## 2017-12-01 DIAGNOSIS — Z720 Tobacco use: Secondary | ICD-10-CM | POA: Diagnosis not present

## 2017-12-01 DIAGNOSIS — N2581 Secondary hyperparathyroidism of renal origin: Secondary | ICD-10-CM | POA: Diagnosis not present

## 2017-12-04 ENCOUNTER — Telehealth (INDEPENDENT_AMBULATORY_CARE_PROVIDER_SITE_OTHER): Payer: Self-pay

## 2017-12-04 ENCOUNTER — Other Ambulatory Visit: Payer: Self-pay | Admitting: Family Medicine

## 2017-12-04 DIAGNOSIS — R1032 Left lower quadrant pain: Secondary | ICD-10-CM | POA: Diagnosis not present

## 2017-12-04 DIAGNOSIS — R1031 Right lower quadrant pain: Secondary | ICD-10-CM

## 2017-12-04 DIAGNOSIS — I714 Abdominal aortic aneurysm, without rupture, unspecified: Secondary | ICD-10-CM

## 2017-12-04 NOTE — Telephone Encounter (Signed)
Patient called stating that she is having stomach pains in the lower part of her abdomen. She states that the pain started on the right side and now it is effecting the left side of her stomach.   She would like to know if that may be due to the "leaky" aneurysm?

## 2017-12-05 ENCOUNTER — Other Ambulatory Visit: Payer: Self-pay | Admitting: Family Medicine

## 2017-12-05 ENCOUNTER — Ambulatory Visit
Admission: RE | Admit: 2017-12-05 | Discharge: 2017-12-05 | Disposition: A | Discharge status: Home / Self Care | Payer: Medicare HMO | Source: Ambulatory Visit | Attending: Family Medicine | Admitting: Family Medicine

## 2017-12-05 DIAGNOSIS — I723 Aneurysm of iliac artery: Secondary | ICD-10-CM | POA: Diagnosis not present

## 2017-12-05 DIAGNOSIS — R1031 Right lower quadrant pain: Secondary | ICD-10-CM

## 2017-12-05 DIAGNOSIS — I714 Abdominal aortic aneurysm, without rupture, unspecified: Secondary | ICD-10-CM

## 2017-12-05 DIAGNOSIS — K5730 Diverticulosis of large intestine without perforation or abscess without bleeding: Secondary | ICD-10-CM | POA: Diagnosis not present

## 2017-12-05 DIAGNOSIS — R109 Unspecified abdominal pain: Secondary | ICD-10-CM

## 2017-12-05 LAB — POCT I-STAT CREATININE: Creatinine, Ser: 2.2 mg/dL — ABNORMAL HIGH (ref 0.44–1.00)

## 2017-12-05 NOTE — Telephone Encounter (Signed)
Called the patient back to give her Dr. Bunnie Domino advice and to offer her an appointment for her to have a CT done if she would like to go that route. I had to leave her a message.

## 2017-12-26 DIAGNOSIS — I25810 Atherosclerosis of coronary artery bypass graft(s) without angina pectoris: Secondary | ICD-10-CM | POA: Diagnosis not present

## 2017-12-26 DIAGNOSIS — N183 Chronic kidney disease, stage 3 (moderate): Secondary | ICD-10-CM | POA: Diagnosis not present

## 2017-12-26 DIAGNOSIS — Z720 Tobacco use: Secondary | ICD-10-CM | POA: Diagnosis not present

## 2017-12-26 DIAGNOSIS — J432 Centrilobular emphysema: Secondary | ICD-10-CM | POA: Diagnosis not present

## 2017-12-26 DIAGNOSIS — R569 Unspecified convulsions: Secondary | ICD-10-CM | POA: Diagnosis not present

## 2017-12-26 DIAGNOSIS — E1121 Type 2 diabetes mellitus with diabetic nephropathy: Secondary | ICD-10-CM | POA: Diagnosis not present

## 2017-12-26 DIAGNOSIS — I10 Essential (primary) hypertension: Secondary | ICD-10-CM | POA: Diagnosis not present

## 2017-12-30 DIAGNOSIS — I25810 Atherosclerosis of coronary artery bypass graft(s) without angina pectoris: Secondary | ICD-10-CM | POA: Diagnosis not present

## 2017-12-30 DIAGNOSIS — E1121 Type 2 diabetes mellitus with diabetic nephropathy: Secondary | ICD-10-CM | POA: Diagnosis not present

## 2017-12-30 DIAGNOSIS — Z720 Tobacco use: Secondary | ICD-10-CM | POA: Diagnosis not present

## 2017-12-30 DIAGNOSIS — I480 Paroxysmal atrial fibrillation: Secondary | ICD-10-CM | POA: Diagnosis not present

## 2017-12-30 DIAGNOSIS — J432 Centrilobular emphysema: Secondary | ICD-10-CM | POA: Diagnosis not present

## 2017-12-30 DIAGNOSIS — M542 Cervicalgia: Secondary | ICD-10-CM | POA: Diagnosis not present

## 2018-02-23 DIAGNOSIS — I129 Hypertensive chronic kidney disease with stage 1 through stage 4 chronic kidney disease, or unspecified chronic kidney disease: Secondary | ICD-10-CM | POA: Diagnosis not present

## 2018-02-23 DIAGNOSIS — N2581 Secondary hyperparathyroidism of renal origin: Secondary | ICD-10-CM | POA: Diagnosis not present

## 2018-02-23 DIAGNOSIS — R809 Proteinuria, unspecified: Secondary | ICD-10-CM | POA: Diagnosis not present

## 2018-02-23 DIAGNOSIS — N184 Chronic kidney disease, stage 4 (severe): Secondary | ICD-10-CM | POA: Diagnosis not present

## 2018-02-23 DIAGNOSIS — M109 Gout, unspecified: Secondary | ICD-10-CM | POA: Diagnosis not present

## 2018-04-14 ENCOUNTER — Emergency Department: Payer: Medicare HMO

## 2018-04-14 ENCOUNTER — Emergency Department
Admission: EM | Admit: 2018-04-14 | Discharge: 2018-04-14 | Disposition: A | Discharge status: Home / Self Care | Payer: Medicare HMO | Attending: Emergency Medicine | Admitting: Emergency Medicine

## 2018-04-14 ENCOUNTER — Encounter: Payer: Self-pay | Admitting: Emergency Medicine

## 2018-04-14 ENCOUNTER — Other Ambulatory Visit: Payer: Self-pay

## 2018-04-14 DIAGNOSIS — Z85118 Personal history of other malignant neoplasm of bronchus and lung: Secondary | ICD-10-CM | POA: Diagnosis not present

## 2018-04-14 DIAGNOSIS — I2510 Atherosclerotic heart disease of native coronary artery without angina pectoris: Secondary | ICD-10-CM | POA: Insufficient documentation

## 2018-04-14 DIAGNOSIS — F17210 Nicotine dependence, cigarettes, uncomplicated: Secondary | ICD-10-CM | POA: Diagnosis not present

## 2018-04-14 DIAGNOSIS — N184 Chronic kidney disease, stage 4 (severe): Secondary | ICD-10-CM | POA: Insufficient documentation

## 2018-04-14 DIAGNOSIS — E1122 Type 2 diabetes mellitus with diabetic chronic kidney disease: Secondary | ICD-10-CM | POA: Diagnosis not present

## 2018-04-14 DIAGNOSIS — J40 Bronchitis, not specified as acute or chronic: Secondary | ICD-10-CM | POA: Diagnosis not present

## 2018-04-14 DIAGNOSIS — I130 Hypertensive heart and chronic kidney disease with heart failure and stage 1 through stage 4 chronic kidney disease, or unspecified chronic kidney disease: Secondary | ICD-10-CM | POA: Diagnosis not present

## 2018-04-14 DIAGNOSIS — J449 Chronic obstructive pulmonary disease, unspecified: Secondary | ICD-10-CM | POA: Diagnosis not present

## 2018-04-14 DIAGNOSIS — Z79899 Other long term (current) drug therapy: Secondary | ICD-10-CM | POA: Diagnosis not present

## 2018-04-14 DIAGNOSIS — I509 Heart failure, unspecified: Secondary | ICD-10-CM | POA: Insufficient documentation

## 2018-04-14 DIAGNOSIS — Z951 Presence of aortocoronary bypass graft: Secondary | ICD-10-CM | POA: Insufficient documentation

## 2018-04-14 DIAGNOSIS — R05 Cough: Secondary | ICD-10-CM | POA: Diagnosis not present

## 2018-04-14 DIAGNOSIS — Z7982 Long term (current) use of aspirin: Secondary | ICD-10-CM | POA: Diagnosis not present

## 2018-04-14 MED ORDER — PREDNISONE 50 MG PO TABS: ORAL_TABLET | ORAL | 0 refills | Status: DC

## 2018-04-14 NOTE — ED Triage Notes (Addendum)
Pt to ED with c/o cough, congestion, runny nose x2wks. Pt had z-pack with no relief. Productive cough noted. VSS,. Pt legally blind

## 2018-04-14 NOTE — ED Triage Notes (Signed)
First Nurse Note:  C/O productive cough and chest congestion x 3 weeks.  States symptoms worse at night. Patient is AAOx3.  Skin warm and dry. NAD.  Patient is legally blind, placed in a WC for safety.

## 2018-04-14 NOTE — ED Provider Notes (Signed)
[facility] Emergency Department Provider Note  ____________________________________________  Time seen: Approximately 5:09 PM  I have reviewed the triage vital signs and the nursing notes.   HISTORY  Chief Complaint Nasal Congestion    HPI Michelle Murillo is a 72 y.o. female presents to the emergency department with cough productive for yellow sputum production for the past 3 weeks.  Symptoms started with viral URI.  Patient has taken azithromycin and Ceftin as prescribed by primary care but has not taken prednisone as patient is apprehensive about gaining weight.  She has had occasional shortness of breath but has not had fever or chills for the past 3 days. Patient has had community-acquired pneumonia in the past.  No prior intubations. No other alleviating measures have been attempted.      Past Medical History:  Diagnosis Date  . Abdominal aneurysm ([place])   . 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 CHF (congestive heart failure) ([place])   . Chronic kidney disease 2019   stage IV chronic kidney disease. now functions at 35%  . Complication of anesthesia 2013   "stops breathing and goes into convulsions".  pt was in a coma for 18 days after this.(during CABG)  . Complication of anesthesia    dr. Doris Cheadle is attending provider. surgery was at [facility]  . COPD (chronic obstructive pulmonary disease) ([place])   . Coronary artery disease   . Diabetes mellitus without complication (HCC)    borderline, does not take any medication  . GERD (gastroesophageal reflux disease)   . Gout   . Hypertension   . Legally blind    unable to see any more than shapes  . Seizures ([place]) 2013   only happened during cabg procedure while on the table    Patient Active Problem List   Diagnosis Date Noted  . Hypertension 03/21/2017  . COPD (chronic obstructive pulmonary disease) ([place]) 03/21/2017  . Diabetes mellitus without complication  ([place]) 26/37/8588  . Chronic kidney disease (CKD), stage IV (severe) ([place]) 03/21/2017  . AAA (abdominal aortic aneurysm) without rupture ([place]) 03/21/2017    Past Surgical History:  Procedure Laterality Date  . ABDOMINAL AORTOGRAM N/A 06/23/2017   Procedure: ABDOMINAL AORTOGRAM;  Surgeon: Algernon Huxley, MD;  Location: [place] [facility];  Service: Cardiovascular;  Laterality: N/A;  . ABDOMINAL SURGERY  2017   aneurysm repair with dr. dew  . BRONCHOSCOPY  2015   done at [facility]. carcinoid of airways  . BYPASS GRAFT  2013   cabg x 4  . CARDIAC SURGERY  2013   cabg  . CHOLECYSTECTOMY     age 56  . CORONARY ARTERY BYPASS GRAFT      Prior to Admission medications   Medication Sig Start Date End Date Taking? Authorizing Provider  allopurinol (ZYLOPRIM) 300 MG tablet Take 300 mg by mouth 3 (three) times a week.  03/14/17   [provider]  aspirin EC 81 MG tablet Take 81 mg by mouth daily.    [provider]  colchicine 0.6 MG tablet Take 0.6 mg by mouth daily as needed (for gout attack/flare ups).  08/21/16   [provider]  furosemide (LASIX) 40 MG tablet Take 40 mg by mouth daily as needed (for fluid retention (swollen ankles)---typically no more than twice a week).     [provider]  HYDROcodone-acetaminophen (NORCO/VICODIN) 5-325 MG tablet Take 1 tablet by mouth 2 (two) times daily  as needed. For severe pain. 06/02/17   [provider]  losartan (COZAAR) 100 MG tablet Take 100 mg by mouth daily.    [provider]  metoprolol tartrate (LOPRESSOR) 25 MG tablet Take 25 mg by mouth 2 (two) times daily.  03/14/17   [provider]  omeprazole (PRILOSEC) 40 MG capsule Take 40 mg by mouth daily as needed (for acid reflux/indigestion.).    [provider]  predniSONE (DELTASONE) 50 MG tablet Take one 50 mg tablet once daily for the next five days. 04/14/18   Lannie Fields, PA-C  Vitamin D, Ergocalciferol, (DRISDOL) 50000  units CAPS capsule Take 50,000 Units by mouth every 30 (thirty) days.  01/23/17   [provider]    Allergies Bupropion and Morphine and related  Family History  Problem Relation Age of Onset  . Congestive Heart Failure Mother   . Heart attack Mother   . Cancer Father   . Liver cancer Father   . Cancer Paternal Grandfather     Social History Social History   Tobacco Use  . Smoking status: Current Every Day Smoker    Packs/day: 0.50    Types: Cigarettes  . Smokeless tobacco: Never Used  . Tobacco comment: 6-7 cigs per day  Substance Use Topics  . Alcohol use: No  . Drug use: No     Review of Systems  Constitutional: No fever/chills Eyes: No visual changes. No discharge ENT: No upper respiratory complaints. Cardiovascular: no chest pain. Respiratory: Patient has cough. No SOB. Gastrointestinal: No abdominal pain.  No nausea, no vomiting.  No diarrhea.  No constipation. Genitourinary: Negative for dysuria. No hematuria Musculoskeletal: Negative for musculoskeletal pain. Skin: Negative for rash, abrasions, lacerations, ecchymosis. Neurological: Negative for headaches, focal weakness or numbness.   ____________________________________________   PHYSICAL EXAM:  VITAL SIGNS: ED Triage Vitals  Enc Vitals Group     BP 04/14/18 1555 (!) 191/88     Pulse Rate 04/14/18 1554 71     Resp 04/14/18 1554 16     Temp 04/14/18 1554 98.3 F (36.8 C)     Temp Source 04/14/18 1554 Oral     SpO2 04/14/18 1554 98 %     Weight --      Height --      Head Circumference --      Peak Flow --      Pain Score 04/14/18 1555 7     Pain Loc --      Pain Edu? --      Excl. in [place]? --      Constitutional: Alert and oriented. Well appearing and in no acute distress. Eyes: Conjunctivae are normal. PERRL. EOMI. Head: Atraumatic. ENT:      Ears: TMs are injected bilaterally.      Nose: No congestion/rhinnorhea.      Mouth/Throat: Mucous membranes are moist.  Neck: No  stridor.  No cervical spine tenderness to palpation. Hematological/Lymphatic/Immunilogical: No cervical lymphadenopathy. Cardiovascular: Normal rate, regular rhythm. Normal S1 and S2.  Good peripheral circulation. Respiratory: Normal respiratory effort without tachypnea or retractions. Lungs CTAB. Good air entry to the bases with no decreased or absent breath sounds. Gastrointestinal: Bowel sounds 4 quadrants. Soft and nontender to palpation. No guarding or rigidity. No palpable masses. No distention. No CVA tenderness. Musculoskeletal: Full range of motion to all extremities. No gross deformities appreciated. Neurologic:  Normal speech and language. No gross focal neurologic deficits are appreciated.  Skin:  Skin is warm, dry and intact.  No rash noted. Psychiatric: Mood and affect are normal. Speech and behavior are normal. Patient exhibits appropriate insight and judgement.   ____________________________________________   LABS (all labs ordered are listed, but only abnormal results are displayed)  Labs Reviewed - No data to display ____________________________________________  EKG   ____________________________________________  RADIOLOGY I personally viewed and evaluated these images as part of my medical decision making, as well as reviewing the written report by the radiologist.  Dg Chest 2 View  Result Date: 04/14/2018 CLINICAL DATA:  Cough, congestion EXAM: CHEST - 2 VIEW COMPARISON:  CT 08/25/2017 FINDINGS: Prior CABG. Heart is normal size. No confluent opacities or effusions. No overt edema. No acute bony abnormality. IMPRESSION: Cardiomegaly.  No active disease. Electronically Signed   By: Rolm Baptise M.D.   On: 04/14/2018 16:40    ____________________________________________    PROCEDURES  Procedure(s) performed:    Procedures    Medications - No data to display   ____________________________________________   INITIAL IMPRESSION / ASSESSMENT AND PLAN /  ED COURSE  Pertinent labs & imaging results that were available during my care of the patient were reviewed by me and considered in my medical decision making (see chart for details).  Review of the  [place]RS was performed in accordance of the [place] prior to dispensing any controlled drugs.    Assessment and plan Bronchitis Patient presents to the emergency department with shortness of breath and productive cough for the past 3 weeks.  Patient has been previously prescribed azithromycin and Ceftin which she has taken at the recommendation of her primary care provider.  Patient denied blood work in the emergency department stating that she had "errands to run".  Chest x-ray reveals no consolidations or opacities consistent with community-acquired pneumonia.  No pleural effusions visualized.  Patient was started empirically on prednisone.  She was advised to follow-up with primary care in 1 week if her symptoms do not improve.  All patient questions were answered.      ____________________________________________  FINAL CLINICAL IMPRESSION(S) / ED DIAGNOSES  Final diagnoses:  Bronchitis      NEW MEDICATIONS STARTED DURING THIS VISIT:  ED Discharge Orders         Ordered    predniSONE (DELTASONE) 50 MG tablet     04/14/18 1701              This chart was dictated using voice recognition software/Dragon. Despite best efforts to proofread, errors can occur which can change the meaning. Any change was purely unintentional.    Lannie Fields, PA-C 04/14/18 1718    Harvest Dark, MD 04/14/18 2322

## 2018-04-14 NOTE — ED Notes (Addendum)
See triage note Presents with a 3 weeks hx of cough and sinus pressure/headache  States she has had z-pak and ceftin  But no change in sxs'  Has had some fever but afebrile on arrival  States her cough has been prod at times

## 2018-05-04 DIAGNOSIS — Z720 Tobacco use: Secondary | ICD-10-CM | POA: Diagnosis not present

## 2018-05-04 DIAGNOSIS — M542 Cervicalgia: Secondary | ICD-10-CM | POA: Diagnosis not present

## 2018-05-04 DIAGNOSIS — J432 Centrilobular emphysema: Secondary | ICD-10-CM | POA: Diagnosis not present

## 2018-05-04 DIAGNOSIS — I480 Paroxysmal atrial fibrillation: Secondary | ICD-10-CM | POA: Diagnosis not present

## 2018-05-04 DIAGNOSIS — E1121 Type 2 diabetes mellitus with diabetic nephropathy: Secondary | ICD-10-CM | POA: Diagnosis not present

## 2018-05-04 DIAGNOSIS — I25810 Atherosclerosis of coronary artery bypass graft(s) without angina pectoris: Secondary | ICD-10-CM | POA: Diagnosis not present

## 2018-05-12 DIAGNOSIS — I25810 Atherosclerosis of coronary artery bypass graft(s) without angina pectoris: Secondary | ICD-10-CM | POA: Diagnosis not present

## 2018-05-12 DIAGNOSIS — Z720 Tobacco use: Secondary | ICD-10-CM | POA: Diagnosis not present

## 2018-05-12 DIAGNOSIS — M170 Bilateral primary osteoarthritis of knee: Secondary | ICD-10-CM | POA: Diagnosis not present

## 2018-05-12 DIAGNOSIS — I10 Essential (primary) hypertension: Secondary | ICD-10-CM | POA: Diagnosis not present

## 2018-05-12 DIAGNOSIS — Z951 Presence of aortocoronary bypass graft: Secondary | ICD-10-CM | POA: Diagnosis not present

## 2018-05-12 DIAGNOSIS — G4733 Obstructive sleep apnea (adult) (pediatric): Secondary | ICD-10-CM | POA: Diagnosis not present

## 2018-05-12 DIAGNOSIS — J432 Centrilobular emphysema: Secondary | ICD-10-CM | POA: Diagnosis not present

## 2018-05-12 DIAGNOSIS — I480 Paroxysmal atrial fibrillation: Secondary | ICD-10-CM | POA: Diagnosis not present

## 2018-05-12 DIAGNOSIS — E1121 Type 2 diabetes mellitus with diabetic nephropathy: Secondary | ICD-10-CM | POA: Diagnosis not present

## 2018-06-05 ENCOUNTER — Telehealth (INDEPENDENT_AMBULATORY_CARE_PROVIDER_SITE_OTHER): Payer: Self-pay

## 2018-06-05 NOTE — Telephone Encounter (Signed)
Patient called and left a message on the triage line and stated she is having sharp pains in her abdomen and is unsure if that is related to the aneurism. Please advise

## 2018-06-05 NOTE — Telephone Encounter (Signed)
Please schedule patient for appointment requested below

## 2018-06-05 NOTE — Telephone Encounter (Signed)
It is highly unlikely that that is coming from her aneurysm, as we have repaired and the last CT scan didn't show evidence of leakage.  It is almost time for her to have it evaluated, so we can get her scheduled to come in for an EVAR ultrasound. If she begins to have severe pain in her abdomen that radiates to her back, she should go to the ER for evaluation.

## 2018-06-08 ENCOUNTER — Other Ambulatory Visit (INDEPENDENT_AMBULATORY_CARE_PROVIDER_SITE_OTHER): Payer: Self-pay | Admitting: Vascular Surgery

## 2018-06-08 ENCOUNTER — Encounter (INDEPENDENT_AMBULATORY_CARE_PROVIDER_SITE_OTHER): Payer: Self-pay | Admitting: Vascular Surgery

## 2018-06-08 ENCOUNTER — Ambulatory Visit (INDEPENDENT_AMBULATORY_CARE_PROVIDER_SITE_OTHER): Payer: Medicare HMO | Admitting: Vascular Surgery

## 2018-06-08 ENCOUNTER — Ambulatory Visit (INDEPENDENT_AMBULATORY_CARE_PROVIDER_SITE_OTHER): Payer: Medicare HMO

## 2018-06-08 VITALS — BP 138/72 | HR 71 | Resp 16 | Ht 65.0 in | Wt 235.4 lb

## 2018-06-08 DIAGNOSIS — I129 Hypertensive chronic kidney disease with stage 1 through stage 4 chronic kidney disease, or unspecified chronic kidney disease: Secondary | ICD-10-CM | POA: Diagnosis not present

## 2018-06-08 DIAGNOSIS — I10 Essential (primary) hypertension: Secondary | ICD-10-CM

## 2018-06-08 DIAGNOSIS — I714 Abdominal aortic aneurysm, without rupture, unspecified: Secondary | ICD-10-CM

## 2018-06-08 DIAGNOSIS — N184 Chronic kidney disease, stage 4 (severe): Secondary | ICD-10-CM

## 2018-06-08 DIAGNOSIS — Z9889 Other specified postprocedural states: Secondary | ICD-10-CM | POA: Diagnosis not present

## 2018-06-09 ENCOUNTER — Encounter (INDEPENDENT_AMBULATORY_CARE_PROVIDER_SITE_OTHER): Payer: Self-pay | Admitting: Vascular Surgery

## 2018-06-09 NOTE — Progress Notes (Signed)
Subjective:    Patient ID: Michelle Murillo, female    DOB: March 04, 1946, 73 y.o.   MRN: [id number] Chief Complaint  Patient presents with  . Follow-up    The patient has a known history of an abdominal aortic aneurysm status post repair on March 27, 2014.  The patient has been followed with surveillance duplexes.  Approximately, 2 weeks ago the patient started to experience "abdominal pain".  Patient describes his pain as intermittent, occurs randomly, denies any postprandial pain, lasts for a few seconds and resolves.  Is experiencing nausea however denies any vomiting.  Denies any major changes in her bowel function with exception of constipation.  The patient underwent a abdominal aortic duplex which was notable for dilatation of the distal abdominal aorta.  The largest aortic measurement is now 6.9 cm.  Previously on June 13, 2017 it measured 6.4 cm.  Duplex was notable for patent endovascular aneurysm repair with evidence of endoleak.  2 and fro flow seen within the old aneurysm sac.  Bilateral limbs are located in the posterior right portion of the old AAA sac and flow suggest possible compression of the limbs.  Low monophasic flow seen in the bilateral limbs.  Patient denies any fever, nausea vomiting.  Review of Systems  Constitutional: Negative.   HENT: Negative.   Eyes: Negative.   Respiratory: Negative.   Cardiovascular: Negative.   Gastrointestinal: Positive for abdominal pain.  Endocrine: Negative.   Genitourinary: Negative.   Musculoskeletal: Negative.   Skin: Negative.   Allergic/Immunologic: Negative.   Neurological: Negative.   Hematological: Negative.   Psychiatric/Behavioral: Negative.       Objective:   Physical Exam Vitals signs reviewed.  Constitutional:      Appearance: Normal appearance. She is normal weight.  HENT:     Head: Normocephalic and atraumatic.     Right Ear: External ear normal.     Left Ear: External ear normal.     Nose: Nose normal.     Mouth/Throat:     Mouth: Mucous membranes are dry.     Pharynx: Oropharynx is clear.  Eyes:     Extraocular Movements: Extraocular movements intact.     Conjunctiva/sclera: Conjunctivae normal.     Pupils: Pupils are equal, round, and reactive to light.  Neck:     Musculoskeletal: Normal range of motion.  Cardiovascular:     Rate and Rhythm: Normal rate and regular rhythm.  Pulmonary:     Effort: Pulmonary effort is normal.     Breath sounds: Normal breath sounds.  Abdominal:     General: Abdomen is flat. Bowel sounds are normal. There is no distension.     Palpations: Abdomen is soft.     Tenderness: There is no abdominal tenderness. There is no guarding.  Musculoskeletal: Normal range of motion.        General: Swelling (Mild nonpitting edema noted bilaterally) present.  Skin:    General: Skin is warm and dry.  Neurological:     General: No focal deficit present.     Mental Status: She is alert and oriented to person, place, and time. Mental status is at baseline.  Psychiatric:        Mood and Affect: Mood normal.        Behavior: Behavior normal.        Thought Content: Thought content normal.        Judgment: Judgment normal.    BP 138/72 (BP Location: Left Arm, Patient Position: Sitting)  Pulse 71   Resp 16   Ht 5\' 5"  (1.651 m)   Wt 235 lb 6.4 oz (106.8 kg)   BMI 39.17 kg/m    Past Medical History:  Diagnosis Date  . Abdominal aneurysm ([place])   . 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 CHF (congestive heart failure) ([place])   . Chronic kidney disease 2019   stage IV chronic kidney disease. now functions at 35%  . Complication of anesthesia 2013   "stops breathing and goes into convulsions".  pt was in a coma for 18 days after this.(during CABG)  . Complication of anesthesia    dr. Doris Cheadle is attending provider. surgery was at [facility]  . COPD (chronic obstructive pulmonary disease) ([place])   . Coronary artery disease   .  Diabetes mellitus without complication (HCC)    borderline, does not take any medication  . GERD (gastroesophageal reflux disease)   . Gout   . Hypertension   . Legally blind    unable to see any more than shapes  . Seizures ([place]) 2013   only happened during cabg procedure while on the table   Social History   Socioeconomic History  . Marital status: Widowed    Spouse name: Not on file  . Number of children: Not on file  . Years of education: Not on file  . Highest education level: Not on file  Occupational History  . Not on file  Social Needs  . Financial resource strain: Not on file  . Food insecurity:    Worry: Not on file    Inability: Not on file  . Transportation needs:    Medical: Not on file    Non-medical: Not on file  Tobacco Use  . Smoking status: Current Every Day Smoker    Packs/day: 0.50    Types: Cigarettes  . Smokeless tobacco: Never Used  . Tobacco comment: 6-7 cigs per day  Substance and Sexual Activity  . Alcohol use: No  . Drug use: No  . Sexual activity: Not on file  Lifestyle  . Physical activity:    Days per week: Not on file    Minutes per session: Not on file  . Stress: Not on file  Relationships  . Social connections:    Talks on phone: Not on file    Gets together: Not on file    Attends religious service: Not on file    Active member of club or organization: Not on file    Attends meetings of clubs or organizations: Not on file    Relationship status: Not on file  . Intimate partner violence:    Fear of current or ex partner: Not on file    Emotionally abused: Not on file    Physically abused: Not on file    Forced sexual activity: Not on file  Other Topics Concern  . Not on file  Social History Narrative  . Not on file   Past Surgical History:  Procedure Laterality Date  . ABDOMINAL AORTOGRAM N/A 06/23/2017   Procedure: ABDOMINAL AORTOGRAM;  Surgeon: Algernon Huxley, MD;  Location: [facility];  Service: Cardiovascular;   Laterality: N/A;  . ABDOMINAL SURGERY  2017   aneurysm repair with dr. dew  . BRONCHOSCOPY  2015   done at [place]. carcinoid of airways  . BYPASS GRAFT  2013   cabg x 4  . CARDIAC SURGERY  2013  cabg  . CHOLECYSTECTOMY     age 71  . CORONARY ARTERY BYPASS GRAFT     Family History  Problem Relation Age of Onset  . Congestive Heart Failure Mother   . Heart attack Mother   . Cancer Father   . Liver cancer Father   . Cancer Paternal Grandfather    Allergies  Allergen Reactions  . Bupropion Nausea Only    Other reaction(s): Other (See Comments) "I didn't do well with it.".hallucinates  . Morphine And Related Itching      Assessment & Plan:   The patient has a known history of an abdominal aortic aneurysm status post repair on March 27, 2014.  The patient has been followed with surveillance duplexes.  Approximately, 2 weeks ago the patient started to experience "abdominal pain".  Patient describes his pain as intermittent, occurs randomly, denies any postprandial pain, lasts for a few seconds and resolves.  Is experiencing nausea however denies any vomiting.  Denies any major changes in her bowel function with exception of constipation.  The patient underwent a abdominal aortic duplex which was notable for dilatation of the distal abdominal aorta.  The largest aortic measurement is now 6.9 cm.  Previously on June 13, 2017 it measured 6.4 cm.  Duplex was notable for patent endovascular aneurysm repair with evidence of endoleak.  To and fro flow seen within the old aneurysm sac.  Bilateral limbs are located in the posterior right portion of the old AAA sac and flow suggest possible compression of the limbs.  Low monophasic flow seen in the bilateral limbs.  Patient denies any fever, nausea vomiting.  1. AAA (abdominal aortic aneurysm) without rupture (HCC) - Stable Patient presents with 2 weeks of abdominal pain She is status post an endovascular AAA repair on March 27, 2014 Today's  duplex shows an increase in the old aneurysmal sac with To and fro flow seen within the old aneurysm sac In the setting of the patient's abdominal pain, increasing mild aneurysmal sac with active blood flow noted we will order a stat CTA of the abdomen and pelvis for closer look at the patient's anatomy and in preparation for possible repair  - CT Angio Abd/Pel w/ and/or w/o; Future - BUN+Creat; Future  2. Chronic kidney disease (CKD), stage IV (severe) (HCC) - Stable Encouraged good hypertension control as this directly affects kidney function  3. Essential hypertension - Stable Encouraged good control as its slows the progression of atherosclerotic and aneurysmal disease  Current Outpatient Medications on File Prior to Visit  Medication Sig Dispense Refill  . allopurinol (ZYLOPRIM) 300 MG tablet Take 300 mg by mouth 3 (three) times a week.     Marland Kitchen aspirin EC 81 MG tablet Take 81 mg by mouth daily.    . colchicine (COLCRYS) 0.6 MG tablet Take by mouth as needed.    . furosemide (LASIX) 40 MG tablet Take 40 mg by mouth daily as needed (for fluid retention (swollen ankles)---typically no more than twice a week).     . gabapentin (NEURONTIN) 300 MG capsule Take 300 mg by mouth as needed.    Marland Kitchen glimepiride (AMARYL) 1 MG tablet as needed.    Marland Kitchen losartan (COZAAR) 100 MG tablet Take 100 mg by mouth daily.    . metoprolol tartrate (LOPRESSOR) 25 MG tablet Take 25 mg by mouth 2 (two) times daily.     Marland Kitchen omeprazole (PRILOSEC) 40 MG capsule Take 40 mg by mouth daily as needed (for acid reflux/indigestion.).    Marland Kitchen  Vitamin D, Ergocalciferol, (DRISDOL) 50000 units CAPS capsule Take 50,000 Units by mouth every 30 (thirty) days.      No current facility-administered medications on file prior to visit.    There are no Patient Instructions on file for this visit. No follow-ups on file.  Jakel Alphin A Halen Antenucci, PA-C

## 2018-06-10 ENCOUNTER — Telehealth (INDEPENDENT_AMBULATORY_CARE_PROVIDER_SITE_OTHER): Payer: Self-pay

## 2018-06-10 NOTE — Telephone Encounter (Signed)
Patient called and left a message on the triage line and stated that she got a call from [facility] stated she had been approved and she is unsure what for or what her future steps are with her CT scan she was supposed to be getting. Please advise.

## 2018-06-10 NOTE — Telephone Encounter (Signed)
Gae Bon - are you able to help with this? Thanks.

## 2018-06-11 NOTE — Telephone Encounter (Signed)
Pt left a vm on nurse line to be called back. Spoke with pt and she is inquiring about phone note below. Informed her that it looks as though we are still await an answer. She was very anxious and does not want any delay in her care.   Michelle Murillo, please see Kim's message below

## 2018-06-12 NOTE — Telephone Encounter (Signed)
Patient SHOULD be contacted by the CT scheduling department. The auth was attached and forwarded to them to their workque. Or she is welcome to contact them to get scheduled.

## 2018-06-12 NOTE — Telephone Encounter (Signed)
Spoke with pt and informed her of information below. Pt understood and had no additional questions at this time.

## 2018-06-26 ENCOUNTER — Encounter (INDEPENDENT_AMBULATORY_CARE_PROVIDER_SITE_OTHER): Payer: Self-pay | Admitting: Vascular Surgery

## 2018-06-26 ENCOUNTER — Ambulatory Visit (INDEPENDENT_AMBULATORY_CARE_PROVIDER_SITE_OTHER): Payer: Medicare HMO | Admitting: Vascular Surgery

## 2018-06-26 ENCOUNTER — Telehealth (INDEPENDENT_AMBULATORY_CARE_PROVIDER_SITE_OTHER): Payer: Self-pay

## 2018-06-26 VITALS — BP 155/76 | HR 76 | Resp 16 | Ht 65.0 in | Wt 235.4 lb

## 2018-06-26 DIAGNOSIS — I129 Hypertensive chronic kidney disease with stage 1 through stage 4 chronic kidney disease, or unspecified chronic kidney disease: Secondary | ICD-10-CM

## 2018-06-26 DIAGNOSIS — J449 Chronic obstructive pulmonary disease, unspecified: Secondary | ICD-10-CM

## 2018-06-26 DIAGNOSIS — I714 Abdominal aortic aneurysm, without rupture, unspecified: Secondary | ICD-10-CM

## 2018-06-26 DIAGNOSIS — N184 Chronic kidney disease, stage 4 (severe): Secondary | ICD-10-CM

## 2018-06-26 DIAGNOSIS — I10 Essential (primary) hypertension: Secondary | ICD-10-CM

## 2018-06-26 DIAGNOSIS — E1122 Type 2 diabetes mellitus with diabetic chronic kidney disease: Secondary | ICD-10-CM | POA: Diagnosis not present

## 2018-06-26 DIAGNOSIS — F17210 Nicotine dependence, cigarettes, uncomplicated: Secondary | ICD-10-CM | POA: Diagnosis not present

## 2018-06-26 DIAGNOSIS — E119 Type 2 diabetes mellitus without complications: Secondary | ICD-10-CM

## 2018-06-26 NOTE — Patient Instructions (Signed)
Abdominal Aortic Aneurysm    An aneurysm is a bulge in one of the blood vessels that carry blood away from the heart (artery). It happens when blood pushes up against a weak or damaged place in the wall of an artery. An abdominal aortic aneurysm happens in the main artery of the body (aorta).  Some aneurysms may not cause problems. If it grows, it can burst or tear, causing bleeding inside the body. This is an emergency. It needs to be treated right away.  What are the causes?  The exact cause of this condition is not known.  What increases the risk?  The following may make you more likely to get this condition:   Being a female who is 60 years of age or older.   Being white (Caucasian).   Using tobacco.   Having a family history of aneurysms.   Having the following conditions:  ? Hardening of the arteries (arteriosclerosis).  ? Inflammation of the walls of an artery (arteritis).  ? Certain genetic conditions.  ? Being very overweight (obesity).  ? An infection in the wall of the aorta (infectious aortitis).  ? High cholesterol.  ? High blood pressure (hypertension).  What are the signs or symptoms?  Symptoms depend on the size of the aneurysm and how fast it is growing. Most grow slowly and do not cause any symptoms. If symptoms do occur, they may include:   Pain in the belly (abdomen), side, or back.   Feeling full after eating only small amounts of food.   Feeling a throbbing lump in the belly.  Symptoms that the aneurysm has burst (ruptured) include:   Sudden, very bad pain in the belly, side, or back.   Feeling sick to your stomach (nauseous).   Throwing up (vomiting).   Feeling light-headed or passing out.  How is this treated?  Treatment for this condition depends on:   The size of the aneurysm.   How fast it is growing.   Your age.   Your risk of having it burst.  If your aneurysm is smaller than 2 inches (5 cm), your doctor may manage it by:   Checking it often to see if it is getting bigger.  You may have an imaging test (ultrasound) to check it every 3-6 months, every year, or every few years.   Giving you medicines to:  ? Control blood pressure.  ? Treat pain.  ? Fight infection.  If your aneurysm is larger than 2 inches (5 cm), you may need surgery to fix it.  Follow these instructions at home:  Lifestyle   Do not use any products that have nicotine or tobacco in them. This includes cigarettes, e-cigarettes, and chewing tobacco. If you need help quitting, ask your doctor.   Get regular exercise. Ask your doctor what types of exercise are best for you.  Eating and drinking   Eat a heart-healthy diet. This includes eating plenty of:  ? Fresh fruits and vegetables.  ? Whole grains.  ? Low-fat (lean) protein.  ? Low-fat dairy products.   Avoid foods that are high in saturated fat and cholesterol. These foods include red meat and some dairy products.   Do not drink alcohol if:  ? Your doctor tells you not to drink.  ? You are pregnant, may be pregnant, or are planning to become pregnant.   If you drink alcohol:  ? Limit how much you use to:   0-1 drink a day for women.     0-2 drinks a day for men.  ? Be aware of how much alcohol is in your drink. In the [place] one drink equals any of these:   One typical bottle of beer (12 oz).   One-half glass of wine (5 oz).   One shot of hard liquor (1 oz).  General instructions   Take over-the-counter and prescription medicines only as told by your doctor.   Keep your blood pressure within normal limits. Ask your doctor what your blood pressure should be.   Have your blood sugar (glucose) level and cholesterol levels checked regularly. Keep your blood sugar level and cholesterol levels within normal limits.   Avoid heavy lifting and activities that take a lot of effort. Ask your doctor what activities are safe for you.   Keep all follow-up visits as told by your doctor. This is important.  ? Talk to your doctor about regular screenings to see if the  aneurysm is getting bigger.  Contact a doctor if you:   Have pain in your belly, side, or back.   Have a throbbing feeling in your belly.   Have a family history of aneurysms.  Get help right away if you:   Have sudden, bad pain in your belly, side, or back.   Feel sick to your stomach.   Throw up.   Have trouble pooping (constipation).   Have trouble peeing (urinating).   Feel light-headed.   Have a fast heart rate when you stand.   Have sweaty skin that is cold to the touch (clammy).   Have shortness of breath.   Have a fever.  These symptoms may be an emergency. Do not wait to see if the symptoms will go away. Get medical help right away. Call your local emergency services (911 in the [place] Do not drive yourself to the hospital.  Summary   An aneurysm is a bulge in one of the blood vessels that carry blood away from the heart (artery). Some aneurysms may not cause problems.   You may need to have yours checked often. If it grows, it can burst or tear. This causes bleeding inside the body. It needs to be treated right away.   Follow instructions from your doctor about healthy lifestyle changes.   Keep all follow-up visits as told by your doctor. This is important.  This information is not intended to replace advice given to you by your health care provider. Make sure you discuss any questions you have with your health care provider.  Document Released: 09/07/2012 Document Revised: 12/20/2017 Document Reviewed: 12/20/2017  Elsevier Interactive Patient Education  2019 [employer]

## 2018-06-26 NOTE — Assessment & Plan Note (Signed)
There appears to be more sac growth on her recent duplex.  There is also concern for flow in the aneurysm sac that has not been seen previously.  As such, consideration for further evaluation and repair now needs to be given.  I suspect this is an endoleak that is a type Ib endoleak from the distal attachment site from aneurysmal degeneration of the iliac arteries although that is not entirely clear.  It is possible this is a type II endoleak as well.  We will perform an endoleak evaluation and plan concomitant treatment with a limited amount of contrast in the near future.

## 2018-06-26 NOTE — Progress Notes (Signed)
MRN : [id number]  Michelle Murillo is a 73 y.o. ([birth year]) female who presents with chief complaint of  Chief Complaint  Patient presents with  . Follow-up  .  History of Present Illness: Patient returns today in follow up of her aneurysm.  She originally had the aneurysm repaired in 2015.  About a year ago, she had suspected growth and an endoleak evaluation was performed with no obvious endoleak seen.  A recent duplex showed continued growth and now there appeared to be flow in the aneurysm sac that was not seen previously.  A CT scan was originally planned, but she has severe stage IV chronic kidney disease and we are here today to discuss other options.  Current Outpatient Medications  Medication Sig Dispense Refill  . allopurinol (ZYLOPRIM) 300 MG tablet Take 300 mg by mouth 3 (three) times a week.     Marland Kitchen aspirin EC 81 MG tablet Take 81 mg by mouth daily.    . furosemide (LASIX) 40 MG tablet Take 40 mg by mouth daily as needed (for fluid retention (swollen ankles)---typically no more than twice a week).     . gabapentin (NEURONTIN) 300 MG capsule Take 300 mg by mouth as needed.    Marland Kitchen glimepiride (AMARYL) 1 MG tablet as needed.    Marland Kitchen losartan (COZAAR) 100 MG tablet Take 100 mg by mouth daily.    . metoprolol tartrate (LOPRESSOR) 25 MG tablet Take 25 mg by mouth 2 (two) times daily.     Marland Kitchen omeprazole (PRILOSEC) 40 MG capsule Take 40 mg by mouth daily as needed (for acid reflux/indigestion.).    Marland Kitchen Vitamin D, Ergocalciferol, (DRISDOL) 50000 units CAPS capsule Take 50,000 Units by mouth every 30 (thirty) days.      No current facility-administered medications for this visit.     Past Medical History:  Diagnosis Date  . Abdominal aneurysm ([place])   . 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 CHF (congestive heart failure) ([place])   . Chronic kidney disease 2019   stage IV chronic kidney disease. now functions at 35%  . Complication of anesthesia  2013   "stops breathing and goes into convulsions".  pt was in a coma for 18 days after this.(during CABG)  . Complication of anesthesia    dr. Doris Cheadle is attending provider. surgery was at [facility]  . COPD (chronic obstructive pulmonary disease) ([place])   . Coronary artery disease   . Diabetes mellitus without complication (HCC)    borderline, does not take any medication  . GERD (gastroesophageal reflux disease)   . Gout   . Hypertension   . Legally blind    unable to see any more than shapes  . Seizures ([place]) 2013   only happened during cabg procedure while on the table    Past Surgical History:  Procedure Laterality Date  . ABDOMINAL AORTOGRAM N/A 06/23/2017   Procedure: ABDOMINAL AORTOGRAM;  Surgeon: Algernon Huxley, MD;  Location: [facility];  Service: Cardiovascular;  Laterality: N/A;  . ABDOMINAL SURGERY  2017   aneurysm repair with dr. dew  . BRONCHOSCOPY  2015   done at [place]. carcinoid of airways  . BYPASS GRAFT  2013   cabg x 4  . CARDIAC SURGERY  2013   cabg  . CHOLECYSTECTOMY     age 56  . CORONARY ARTERY BYPASS GRAFT      Social History  Substance Use Topics  .  Smoking status: Current Every Day Smoker    Packs/day: 0.50    Types: Cigarettes  . Smokeless tobacco: Never Used  . Alcohol use No  lives alone  Family History No bleeding or clotting disorders No aneurysms, autoimmune diseases, or porphyria       Allergies  Allergen Reactions  . Bupropion Nausea Only    Other reaction(s): Other (See Comments) "I didn't do well with it."  . Morphine And Related Hives     REVIEW OF SYSTEMS(Negative unless checked)  Constitutional: [] ?Weight loss[] ?Fever[] ?Chills Cardiac:[] ?Chest pain[] ?Chest pressure[] ?Palpitations [] ?Shortness of breath when laying flat [] ?Shortness of breath at rest [] ?Shortness of breath with exertion. Vascular: [] ?Pain in legs with walking[] ?Pain in legsat rest[] ?Pain in legs when laying  flat [] ?Claudication [] ?Pain in feet when walking [] ?Pain in feet at rest [] ?Pain in feet when laying flat [] ?History of DVT [] ?Phlebitis [x] ?Swelling in legs [] ?Varicose veins [] ?Non-healing ulcers Pulmonary: [] ?Uses home oxygen [] ?Productive cough[] ?Hemoptysis [] ?Wheeze [x] ?COPD [] ?Asthma Neurologic: [] ?Dizziness [] ?Blackouts [] ?Seizures [] ?History of stroke [] ?History of TIA[] ?Aphasia [] ?Temporary blindness[] ?Dysphagia [] ?Weaknessor numbness in arms [] ?Weakness or numbnessin legs Musculoskeletal: [x] ?Arthritis [] ?Joint swelling [x] ?Joint pain [] ?Low back pain Hematologic:[] ?Easy bruising[] ?Easy bleeding [] ?Hypercoagulable state [] ?Anemic  Gastrointestinal:[] ?Blood in stool[] ?Vomiting blood[x] ?Gastroesophageal reflux/heartburn[x] ?Abdominal pain Genitourinary: [x] ?Chronic kidney disease [] ?Difficulturination [] ?Frequenturination [] ?Burning with urination[] ?Hematuria Skin: [] ?Rashes [] ?Ulcers [] ?Wounds Psychological: [] ?History of anxiety[] ?History of major depression.    Physical Examination  BP (!) 155/76 (BP Location: Right Arm, Patient Position: Sitting, Cuff Size: Large)   Pulse 76   Resp 16   Ht 5\' 5"  (1.651 m)   Wt 235 lb 6.4 oz (106.8 kg)   BMI 39.17 kg/m  Gen:  WD/WN, NAD Head: [place]/AT, No temporalis wasting. Ear/Nose/Throat: Hearing grossly intact, nares w/o erythema or drainage Eyes: Conjunctiva clear. Sclera non-icteric Neck: Supple.  Trachea midline Pulmonary:  Good air movement, no use of accessory muscles.  Cardiac: Irregular Vascular:  Vessel Right Left  Radial Palpable Palpable                                   Gastrointestinal: soft, non-tender/non-distended. No guarding/reflex.  Musculoskeletal: M/S 5/5 throughout.  No deformity or atrophy.  Mild lower extremity edema. Neurologic: Sensation grossly intact in extremities.  Symmetrical.  Speech is fluent.    Psychiatric: Judgment intact, Mood & affect appropriate for pt's clinical situation. Dermatologic: No rashes or ulcers noted.  No cellulitis or open wounds.       Labs No results found for this or any previous visit (from the past 2160 hour(s)).  Radiology Vas [place] Evar Duplex  Result Date: 06/09/2018 Endovascular Aortic Repair Study (EVAR) Vascular Interventions: 03/27/14: EVAR with coil embolization of right internal                         iliac artery branches.  Comparison Study: 06/13/2017 Performing Technologist: Concha Norway RVT  Examination Guidelines: A complete evaluation includes B-mode imaging, spectral Doppler, color Doppler, and power Doppler as needed of all accessible portions of each vessel. Bilateral testing is considered an integral part of a complete examination. Limited examinations for reoccurring indications may be performed as noted.  Abdominal Aorta Findings: +-----------+-------+----------+----------+----------+--------+--------+ Location   AP (cm)Trans (cm)PSV (cm/s)Waveform  ThrombusComments +-----------+-------+----------+----------+----------+--------+--------+ Mid                         63        monophasic                 +-----------+-------+----------+----------+----------+--------+--------+  Distal     6.90   6.26                                           +-----------+-------+----------+----------+----------+--------+--------+ RT EIA Prox                 126       triphasic                  +-----------+-------+----------+----------+----------+--------+--------+ LT EIA Prox                 87        biphasic                   +-----------+-------+----------+----------+----------+--------+--------+ Endovascular Aortic Repair (EVAR): +----------+-------------------+           Velocities (cm/sec) +----------+-------------------+ Aorta     71                  +----------+-------------------+ Right Limb44                   +----------+-------------------+ Left Limb 51                  +----------+-------------------+  Summary: Abdominal Aorta: There is evidence of abnormal dilitation of the Distal Abdominal aorta. The largest aortic measurement is 6.9 cm. Patent endovascular aneurysm repair with evidence of endoleak. To and Fro flow seen within the mid Old aneurysm sac. The bilateral limbs are located in the posterior right portion of the old AAA sac and flow suggests possible compression of the limbs. Low monophasic flow seen in bilateral limbs. Proximal EIA's checked to confirm patency and show strong triphasic flow suggesting patency of the EVAR limbs. The largest aortic diameter has increased compared to prior exam. Previous diameter measurement was 6.4 cm obtained on 06/13/2017.  *See table(s) above for measurements and observations.  Electronically signed by Leotis Pain MD on 06/09/2018 at 4:16:13 PM.   Final     Assessment/Plan Hypertension blood pressure control important in reducing the progression of atherosclerotic disease and aneurysmal growth. On appropriate oral medications.   COPD (chronic obstructive pulmonary disease) ([place]) Fairly significant and has been told she cannot have general anesthesia anymore.  Diabetes mellitus without complication (HCC) blood glucose control important in reducing the progression of atherosclerotic disease. Also, involved in wound healing. On appropriate medications.  Chronic kidney disease (CKD), stage IV (severe) (HCC) Normally, we would get a CT angiogram for further evaluation.  However, with her chronic kidney disease we have discussed other options in this case.  We will perform an angiogram with a limited amount of contrast that would give [place] the option for concomitant treatment as well.  AAA (abdominal aortic aneurysm) without rupture (HCC) There appears to be more sac growth on her recent duplex.  There is also concern for flow in the aneurysm sac that has not  been seen previously.  As such, consideration for further evaluation and repair now needs to be given.  I suspect this is an endoleak that is a type Ib endoleak from the distal attachment site from aneurysmal degeneration of the iliac arteries although that is not entirely clear.  It is possible this is a type II endoleak as well.  We will perform an endoleak evaluation and plan concomitant treatment with a limited amount of contrast in the near future.  Leotis Pain, MD  06/26/2018 9:18 AM    This note was created with Dragon medical transcription system.  Any errors from dictation are purely unintentional

## 2018-06-26 NOTE — Telephone Encounter (Signed)
Left a message for a return call regarding getting the patient scheduled for an Endoleak eval.

## 2018-06-26 NOTE — Assessment & Plan Note (Signed)
Normally, we would get a CT angiogram for further evaluation.  However, with her chronic kidney disease we have discussed other options in this case.  We will perform an angiogram with a limited amount of contrast that would give [place] the option for concomitant treatment as well.

## 2018-06-30 ENCOUNTER — Encounter (INDEPENDENT_AMBULATORY_CARE_PROVIDER_SITE_OTHER): Payer: Self-pay

## 2018-07-04 ENCOUNTER — Encounter: Payer: Self-pay | Admitting: Anesthesiology

## 2018-07-06 ENCOUNTER — Other Ambulatory Visit: Payer: Self-pay

## 2018-07-06 ENCOUNTER — Encounter
Admission: RE | Disposition: A | Discharge status: Home / Self Care | Payer: Self-pay | Source: Home / Self Care | Attending: Vascular Surgery

## 2018-07-06 ENCOUNTER — Ambulatory Visit
Admission: RE | Admit: 2018-07-06 | Discharge: 2018-07-06 | Disposition: A | Discharge status: Home / Self Care | Payer: Medicare HMO | Attending: Vascular Surgery | Admitting: Vascular Surgery

## 2018-07-06 ENCOUNTER — Ambulatory Visit: Payer: Medicare HMO | Admitting: Anesthesiology

## 2018-07-06 ENCOUNTER — Encounter: Payer: Self-pay | Admitting: *Deleted

## 2018-07-06 ENCOUNTER — Other Ambulatory Visit (INDEPENDENT_AMBULATORY_CARE_PROVIDER_SITE_OTHER): Payer: Self-pay | Admitting: Nurse Practitioner

## 2018-07-06 DIAGNOSIS — I714 Abdominal aortic aneurysm, without rupture, unspecified: Secondary | ICD-10-CM

## 2018-07-06 DIAGNOSIS — H548 Legal blindness, as defined in USA: Secondary | ICD-10-CM | POA: Insufficient documentation

## 2018-07-06 DIAGNOSIS — I723 Aneurysm of iliac artery: Secondary | ICD-10-CM | POA: Diagnosis not present

## 2018-07-06 DIAGNOSIS — K219 Gastro-esophageal reflux disease without esophagitis: Secondary | ICD-10-CM | POA: Insufficient documentation

## 2018-07-06 DIAGNOSIS — I509 Heart failure, unspecified: Secondary | ICD-10-CM | POA: Diagnosis not present

## 2018-07-06 DIAGNOSIS — F17210 Nicotine dependence, cigarettes, uncomplicated: Secondary | ICD-10-CM | POA: Diagnosis not present

## 2018-07-06 DIAGNOSIS — J449 Chronic obstructive pulmonary disease, unspecified: Secondary | ICD-10-CM | POA: Insufficient documentation

## 2018-07-06 DIAGNOSIS — Z79899 Other long term (current) drug therapy: Secondary | ICD-10-CM | POA: Insufficient documentation

## 2018-07-06 DIAGNOSIS — Z885 Allergy status to narcotic agent status: Secondary | ICD-10-CM | POA: Diagnosis not present

## 2018-07-06 DIAGNOSIS — M109 Gout, unspecified: Secondary | ICD-10-CM | POA: Diagnosis not present

## 2018-07-06 DIAGNOSIS — I2510 Atherosclerotic heart disease of native coronary artery without angina pectoris: Secondary | ICD-10-CM | POA: Insufficient documentation

## 2018-07-06 DIAGNOSIS — E1122 Type 2 diabetes mellitus with diabetic chronic kidney disease: Secondary | ICD-10-CM | POA: Insufficient documentation

## 2018-07-06 DIAGNOSIS — Z6839 Body mass index (BMI) 39.0-39.9, adult: Secondary | ICD-10-CM | POA: Diagnosis not present

## 2018-07-06 DIAGNOSIS — I130 Hypertensive heart and chronic kidney disease with heart failure and stage 1 through stage 4 chronic kidney disease, or unspecified chronic kidney disease: Secondary | ICD-10-CM | POA: Diagnosis not present

## 2018-07-06 DIAGNOSIS — Z7982 Long term (current) use of aspirin: Secondary | ICD-10-CM | POA: Diagnosis not present

## 2018-07-06 DIAGNOSIS — Z888 Allergy status to other drugs, medicaments and biological substances status: Secondary | ICD-10-CM | POA: Diagnosis not present

## 2018-07-06 DIAGNOSIS — Z8669 Personal history of other diseases of the nervous system and sense organs: Secondary | ICD-10-CM | POA: Diagnosis not present

## 2018-07-06 DIAGNOSIS — N184 Chronic kidney disease, stage 4 (severe): Secondary | ICD-10-CM | POA: Diagnosis not present

## 2018-07-06 DIAGNOSIS — Z951 Presence of aortocoronary bypass graft: Secondary | ICD-10-CM | POA: Insufficient documentation

## 2018-07-06 HISTORY — PX: ENDOVASCULAR STENT GRAFT (AAA): CATH118280

## 2018-07-06 HISTORY — PX: ENDOVASCULAR REPAIR/STENT GRAFT: CATH118280

## 2018-07-06 LAB — BUN: BUN: 37 mg/dL — ABNORMAL HIGH (ref 8–23)

## 2018-07-06 LAB — CREATININE, SERUM
Creatinine, Ser: 2.27 mg/dL — ABNORMAL HIGH (ref 0.44–1.00)
GFR calc Af Amer: 24 mL/min — ABNORMAL LOW (ref >60)
GFR calc non Af Amer: 21 mL/min — ABNORMAL LOW (ref >60)

## 2018-07-06 SURGERY — ENDOVASCULAR REPAIR/STENT GRAFT
Anesthesia: General

## 2018-07-06 MED ORDER — LIDOCAINE-EPINEPHRINE (PF) 1 %-1:200000 IJ SOLN
INTRAMUSCULAR | Status: AC
  Filled 2018-07-06: qty 30

## 2018-07-06 MED ORDER — HEPARIN SODIUM (PORCINE) 1000 UNIT/ML IJ SOLN
INTRAMUSCULAR | Status: AC
  Filled 2018-07-06: qty 1

## 2018-07-06 MED ORDER — LABETALOL HCL 5 MG/ML IV SOLN: 10.0000 mg | INTRAVENOUS | Status: DC | PRN

## 2018-07-06 MED ORDER — CEFAZOLIN SODIUM-DEXTROSE 2-4 GM/100ML-% IV SOLN
INTRAVENOUS | Status: AC
  Filled 2018-07-06: qty 100

## 2018-07-06 MED ORDER — HEPARIN SODIUM (PORCINE) 1000 UNIT/ML IJ SOLN
INTRAMUSCULAR | Status: DC | PRN
  Administered 2018-07-06: 3000 [IU] via INTRAVENOUS

## 2018-07-06 MED ORDER — METHYLPREDNISOLONE SODIUM SUCC 125 MG IJ SOLR: 125.0000 mg | Freq: Once | INTRAMUSCULAR | Status: DC | PRN

## 2018-07-06 MED ORDER — PHENYLEPHRINE HCL 10 MG/ML IJ SOLN
INTRAMUSCULAR | Status: DC | PRN
  Administered 2018-07-06: 100 ug via INTRAVENOUS

## 2018-07-06 MED ORDER — DIPHENHYDRAMINE HCL 50 MG/ML IJ SOLN
50.0000 mg | Freq: Once | INTRAMUSCULAR | Status: AC | PRN
  Administered 2018-07-06: 12.5 mg via INTRAVENOUS

## 2018-07-06 MED ORDER — LIDOCAINE HCL (CARDIAC) PF 100 MG/5ML IV SOSY
PREFILLED_SYRINGE | INTRAVENOUS | Status: DC | PRN
  Administered 2018-07-06: 100 mg via INTRAVENOUS

## 2018-07-06 MED ORDER — FENTANYL CITRATE (PF) 100 MCG/2ML IJ SOLN: 25.0000 ug | INTRAMUSCULAR | Status: DC | PRN

## 2018-07-06 MED ORDER — HYDRALAZINE HCL 20 MG/ML IJ SOLN: 5.0000 mg | INTRAMUSCULAR | Status: DC | PRN

## 2018-07-06 MED ORDER — DEXTROSE 5 % IV SOLN: 2.0000 g | Freq: Once | INTRAVENOUS | Status: DC

## 2018-07-06 MED ORDER — FAMOTIDINE 20 MG PO TABS: 40.0000 mg | ORAL_TABLET | Freq: Once | ORAL | Status: DC | PRN

## 2018-07-06 MED ORDER — ONDANSETRON HCL 4 MG/2ML IJ SOLN: 4.0000 mg | Freq: Four times a day (QID) | INTRAMUSCULAR | Status: DC | PRN

## 2018-07-06 MED ORDER — MIDAZOLAM HCL 2 MG/2ML IJ SOLN
INTRAMUSCULAR | Status: DC | PRN
  Administered 2018-07-06 (×2): 1 mg via INTRAVENOUS

## 2018-07-06 MED ORDER — FENTANYL CITRATE (PF) 100 MCG/2ML IJ SOLN
INTRAMUSCULAR | Status: AC
  Filled 2018-07-06: qty 2

## 2018-07-06 MED ORDER — SODIUM CHLORIDE 0.9 % IV SOLN
INTRAVENOUS | Status: DC
  Administered 2018-07-06: 1000 mL via INTRAVENOUS

## 2018-07-06 MED ORDER — HYDROMORPHONE HCL 1 MG/ML IJ SOLN: 1.0000 mg | Freq: Once | INTRAMUSCULAR | Status: DC | PRN

## 2018-07-06 MED ORDER — ACETAMINOPHEN 325 MG PO TABS: 650.0000 mg | ORAL_TABLET | ORAL | Status: DC | PRN

## 2018-07-06 MED ORDER — ONDANSETRON HCL 4 MG/2ML IJ SOLN: 4.0000 mg | Freq: Once | INTRAMUSCULAR | Status: DC | PRN

## 2018-07-06 MED ORDER — MIDAZOLAM HCL 2 MG/ML PO SYRP: 8.0000 mg | ORAL_SOLUTION | Freq: Once | ORAL | Status: DC | PRN

## 2018-07-06 MED ORDER — SODIUM CHLORIDE 0.9% FLUSH: 3.0000 mL | Freq: Two times a day (BID) | INTRAVENOUS | Status: DC

## 2018-07-06 MED ORDER — HEPARIN (PORCINE) IN NACL 1000-0.9 UT/500ML-% IV SOLN
INTRAVENOUS | Status: AC
  Filled 2018-07-06: qty 1000

## 2018-07-06 MED ORDER — ONDANSETRON HCL 4 MG/2ML IJ SOLN
4.0000 mg | Freq: Four times a day (QID) | INTRAMUSCULAR | Status: DC | PRN
  Administered 2018-07-06: 4 mg via INTRAVENOUS

## 2018-07-06 MED ORDER — SODIUM CHLORIDE 0.9% FLUSH: 3.0000 mL | INTRAVENOUS | Status: DC | PRN

## 2018-07-06 MED ORDER — PROPOFOL 10 MG/ML IV BOLUS
INTRAVENOUS | Status: AC
  Filled 2018-07-06: qty 20

## 2018-07-06 MED ORDER — CEFAZOLIN SODIUM-DEXTROSE 2-4 GM/100ML-% IV SOLN: 2.0000 g | Freq: Once | INTRAVENOUS | Status: DC

## 2018-07-06 MED ORDER — FENTANYL CITRATE (PF) 100 MCG/2ML IJ SOLN
INTRAMUSCULAR | Status: DC | PRN
  Administered 2018-07-06 (×4): 25 ug via INTRAVENOUS

## 2018-07-06 MED ORDER — SODIUM CHLORIDE 0.9 % IV SOLN: INTRAVENOUS | Status: DC

## 2018-07-06 MED ORDER — IOPAMIDOL (ISOVUE-300) INJECTION 61%
INTRAVENOUS | Status: DC | PRN
  Administered 2018-07-06: 35 mL via INTRAVENOUS

## 2018-07-06 MED ORDER — MIDAZOLAM HCL 2 MG/2ML IJ SOLN
INTRAMUSCULAR | Status: AC
  Filled 2018-07-06: qty 2

## 2018-07-06 MED ORDER — SODIUM CHLORIDE 0.9 % IV SOLN: 250.0000 mL | INTRAVENOUS | Status: DC | PRN

## 2018-07-06 MED ORDER — LACTATED RINGERS IV SOLN
INTRAVENOUS | Status: DC | PRN
  Administered 2018-07-06: 10:00:00 via INTRAVENOUS

## 2018-07-06 SURGICAL SUPPLY — 23 items
CATH BEACON 5 .035 40 KMP TP (CATHETERS) ×1 IMPLANT
CATH BEACON 5 .038 40 KMP TP (CATHETERS) ×1
CATH MICROCATH PRGRT 2.8F 110 (CATHETERS) ×1 IMPLANT
CATH PIG 70CM (CATHETERS) ×2 IMPLANT
COIL 400 COMPLEX SOFT 10X35CM (Vascular Products) ×2 IMPLANT
COIL 400 COMPLEX SOFT 4X15CM (Vascular Products) ×2 IMPLANT
COIL 400 COMPLEX SOFT 6X30CM (Vascular Products) ×2 IMPLANT
COIL 400 COMPLEX STD 4X30CM (Vascular Products) ×2 IMPLANT
COIL 400 COMPLEX STD 4X35CM (Vascular Products) ×2 IMPLANT
COIL 400 COMPLEX STD 6X30CM (Vascular Products) ×2 IMPLANT
DEVICE OCCLUSION POD8 (Vascular Products) ×1 IMPLANT
DEVICE OCCLUSION PODJ45 (Vascular Products) ×1 IMPLANT
DEVICE STARCLOSE SE CLOSURE (Vascular Products) ×2 IMPLANT
HANDLE DETACHMENT COIL (MISCELLANEOUS) ×2 IMPLANT
MICROCATH PROGREAT 2.8F 110 CM (CATHETERS) ×2
OCCLUSION DEVICE POD8 (Vascular Products) ×2 IMPLANT
OCCLUSION DEVICE PODJ45 (Vascular Products) ×2 IMPLANT
PACK ANGIOGRAPHY (CUSTOM PROCEDURE TRAY) ×2 IMPLANT
SHEATH BRITE TIP 5FRX11 (SHEATH) ×2 IMPLANT
SPONGE XRAY 4X4 16PLY STRL (MISCELLANEOUS) ×4 IMPLANT
SYR MEDRAD MARK V 150ML (SYRINGE) ×2 IMPLANT
TUBING CONTRAST HIGH PRESS 72 (TUBING) ×2 IMPLANT
WIRE J 3MM .035X145CM (WIRE) ×2 IMPLANT

## 2018-07-06 NOTE — Anesthesia Post-op Follow-up Note (Signed)
Anesthesia QCDR form completed.        

## 2018-07-06 NOTE — Discharge Instructions (Signed)
Abdominal Aortic Aneurysm Endograft Repair, Care After This sheet gives you information about how to care for yourself after your procedure. Your health care provider may also give you more specific instructions. If you have problems or questions, contact your health care provider. What can I expect after the procedure? After the procedure, it is common to have:  Pain or soreness at the incision site.  Tiredness (fatigue). Follow these instructions at home: Activity  Get plenty of rest.  Follow instructions from your health care provider about how much you should move around and how far you should go when you take short walks. Start walking farther when your health care provider says it is okay.  Limit your activities as told by your health care provider.  Return to your normal activities as told by your health care provider. Ask your health care provider what activities are safe for you. Incision care   Follow instructions from your health care provider about how to take care of your incisions. Make sure you: ? Wash your hands with soap and water before you change your bandage (dressing). If soap and water are not available, use hand sanitizer. ? Change your dressing as told by your health care provider. ? Leave stitches (sutures), skin glue, or adhesive strips in place. These skin closures may need to stay in place for 2 weeks or longer. If adhesive strip edges start to loosen and curl up, you may trim the loose edges. Do not remove adhesive strips completely unless your health care provider tells you to do that.  Keep the incision area clean and dry.  Do not take baths, swim, or use a hot tub until your health care provider approves. Ask your health care provider if you can take showers. You may only be allowed to take sponge baths for bathing.  Check your incision area every day for signs of infection. Check for: ? More redness, swelling, or pain. ? More fluid or  blood. ? Warmth. ? Pus or a bad smell. Lifestyle  Do not use any products that contain nicotine or tobacco, such as cigarettes and e-cigarettes. If you need help quitting, ask your health care provider.  Make any other lifestyle changes that your health care provider suggests. These may include: ? Keeping your blood pressure under control. ? Finding ways to lower stress. ? Eating healthy foods that are good for your heart, such as vegetables, fruits, and whole grains that add fiber to your diet. ? Getting regular exercise. General instructions  Take over-the-counter and prescription medicines only as told by your health care provider.  Keep all follow-up visits as told by your health care provider. This is important. Contact a health care provider if:  You have pain in your abdomen, chest, or back.  You have more redness, swelling, or pain around an incision.  You have more fluid or blood coming from an incision.  Your incision feels warm to the touch.  You have pus or a bad smell coming from an incision.  You have a fever. Get help right away if:  You have trouble breathing.  You suddenly have pain in your legs or you have trouble moving either of your legs.  You faint or you feel very light-headed. This information is not intended to replace advice given to you by your health care provider. Make sure you discuss any questions you have with your health care provider. Document Released: 02/01/2015 Document Revised: 12/01/2015 Document Reviewed: 08/07/2015 Elsevier Interactive Patient Education  2019 [employer].

## 2018-07-06 NOTE — Transfer of Care (Signed)
Immediate Anesthesia Transfer of Care Note  Patient: Michelle Murillo  Procedure(s) Performed: ENDOVASCULAR REPAIR/STENT GRAFT (N/A )  Patient Location: PACU and Radiology  Anesthesia Type:MAC  Level of Consciousness: awake  Airway & Oxygen Therapy: Patient Spontanous Breathing  Post-op Assessment: Report given to RN  Post vital signs: Reviewed  Last Vitals:  Vitals Value Taken Time  BP    Temp    Pulse    Resp    SpO2      Last Pain:  Vitals:   07/06/18 0912  TempSrc: Oral  PainSc: 0-No pain         Complications: No apparent anesthesia complications

## 2018-07-06 NOTE — Anesthesia Preprocedure Evaluation (Addendum)
Anesthesia Evaluation  Patient identified by MRN, date of birth, ID band Patient awake    Reviewed: Allergy & Precautions, NPO status , Patient's Chart, lab work & pertinent test results, reviewed documented beta blocker date and time   Airway Mallampati: III  TM Distance: >3 FB     Dental  (+) Chipped   Pulmonary COPD, Current Smoker,           Cardiovascular hypertension, Pt. on medications and Pt. on home beta blockers + CAD, + CABG and +CHF  + dysrhythmias Atrial Fibrillation      Neuro/Psych Seizures -, Well Controlled,     GI/Hepatic GERD  Controlled,  Endo/Other  diabetes, Type 2Morbid obesity  Renal/GU CRFRenal disease     Musculoskeletal   Abdominal   Peds  Hematology   Anesthesia Other Findings Gout.blind. Dec plts. Rbbb. Smokes. Lung ca. Low sats 90-94. Pt realizes she may not be extubated. Dr Lucky Cowboy thinks MAC might work.   Reproductive/Obstetrics                            Anesthesia Physical Anesthesia Plan  ASA: IV  Anesthesia Plan: General and MAC   Post-op Pain Management:    Induction: Intravenous  PONV Risk Score and Plan:   Airway Management Planned:   Additional Equipment:   Intra-op Plan:   Post-operative Plan:   Informed Consent: I have reviewed the patients History and Physical, chart, labs and discussed the procedure including the risks, benefits and alternatives for the proposed anesthesia with the patient or authorized representative who has indicated his/her understanding and acceptance.       Plan Discussed with: CRNA  Anesthesia Plan Comments:        Anesthesia Quick Evaluation

## 2018-07-06 NOTE — H&P (Signed)
[facility] History & Physical Update  The patient was interviewed and re-examined.  The patient's previous History and Physical has been reviewed and is unchanged.  There is no change in the plan of care. We plan to proceed with the scheduled procedure.  Leotis Pain, MD  07/06/2018, 9:19 AM

## 2018-07-06 NOTE — Anesthesia Postprocedure Evaluation (Signed)
Anesthesia Post Note  Patient: Michelle Murillo  Procedure(s) Performed: ENDOVASCULAR REPAIR/STENT GRAFT (N/A )  Patient location during evaluation: PACU Anesthesia Type: General Level of consciousness: awake and alert Pain management: pain level controlled Vital Signs Assessment: post-procedure vital signs reviewed and stable Respiratory status: spontaneous breathing, nonlabored ventilation, respiratory function stable and patient connected to nasal cannula oxygen Cardiovascular status: blood pressure returned to baseline and stable Postop Assessment: no apparent nausea or vomiting Anesthetic complications: no     Last Vitals:  Vitals:   07/06/18 1230 07/06/18 1300  BP: (!) 122/47 124/71  Pulse: 69 71  Resp: 16 19  Temp:    SpO2: 92% 96%    Last Pain:  Vitals:   07/06/18 1300  TempSrc:   PainSc: 0-No pain                 Monnie Anspach S

## 2018-07-06 NOTE — Op Note (Signed)
[facility] Percutaneous Study/Intervention Procedural Note   Date of Surgery: 07/06/2018  Surgeon(s): Leotis Pain  Assistants:none  Pre-operative Diagnosis: AAA/right iliac aneurysm with sac growth after previous stent graft repair  Post-operative diagnosis: Same  Procedure(s) Performed: 1. Ultrasound guidance for vascular access right femoral artery 2. Catheter placement into the right hypogastric artery through a right circumflex branch feeding into small tertiary branches that then joined the middle sacral branch and went into the right iliac system 3. Aortogram and selective right pelvic angiogram 4. Coil embolization of the right hypogastric artery, aneurysm sac, and tertiary right circumflex branch feeding the aneurysm sac 5. StarClose closure device right femoral artery  EBL: 5 cc  Contrast: 35 cc  Fluoro Time: 5.8 min  Moderate Conscious Sedation by Anesthesia.  Indications: Patient is a 73 y.o. female with enlarging aneurysm sac status post endovascular repair about 5 years ago. The patient has noninvasive study showing sac enlargement with suspected endoleak. The patient is brought in for angiography for further evaluation and potential treatment. Risks and benefits are discussed and informed consent is obtained  Procedure: The patient was identified and appropriate procedural time out was performed. The patient was then placed supine on the table and prepped and draped in the usual sterile fashion. Moderate conscious sedation was administered by anesthesia during the procedure.  Ultrasound was used to evaluate the right common femoral artery. It was patent . A digital ultrasound image was acquired. A Seldinger needle was used to access the right common femoral artery under direct ultrasound guidance and a permanent image was performed. A 0.035 J wire was advanced  without resistance and a 5Fr sheath was placed. Pigtail catheter was placed into the aorta and an AP aortogram was performed. This demonstrated moderate left renal artery stenosis and no right renal artery stenosis.  The stent graft was patent.  There were no type I or III endoleaks.  The left iliac limb was widely patent and the left hypogastric artery had flow but there was not any obvious cross-filling into the pelvis through hypogastric branches.  On delayed imaging, there was a small to medium size branch that seem to be filling the right iliac aneurysm sac.  With a retrograde angiogram through the right femoral sheath this was seen to be from 1 of the circumflex branches joining the middle sacral artery in the pelvis.  At this point, I elected to cannulate the circumflex branch and did this easily with a Kumpe catheter and selective imaging of the circumflex branch was performed showing filling of the aneurysm sac.  Using a pro-grate microcatheter I was able to navigate out circumflex branch itself and then beyond 2 major bifurcations of the circumflex branch into what would be a tertiary branch of the circumflex.  Continuing on we were able to cannulate where this branch joined the right iliac system at the location of the middle sacral branch and get all the way down into the aneurysm sac in the right hypogastric artery which had coils already in this.  I then began coil embolization all the way from the right hypogastric artery and the right common iliac artery in the aneurysm sac.  2 large packing coils were used initially in the right hypogastric artery and common iliac artery location.  A 10 mm x 35 cm soft was also taken down into the right hypogastric artery and aneurysm sac.  We then began pulling the microcatheter back and used a pod 8 and a 6 mm  x 30 cm soft and a 6 mm x 30 cm standard.  There then back in the circumflex secondary branch and we coiled all the way back to the original bifurcation of  the circumflex branch with a total of 3 Ruby coils all 4 mm in diameter and of various lengths.  Selective imaging then performed showed no flow into the aneurysm sac with successful exclusion with the multiple vessels that were coil embolized.  I elected to terminate the procedure. The sheath was removed and StarClose closure device was deployed in the left femoral artery with excellent hemostatic result. The patient was taken to the recovery room in stable condition having tolerated the procedure well.  Findings:  Aortogram:Moderate left renal artery stenosis, no obvious right renal artery stenosis.  Patent stent graft without a type I or type III endoleak.  There was a type II endoleak being fed by right circumflex artery branch into the right iliac system where the sac had been enlarging   Disposition: Patient was taken to the recovery room in stable condition having tolerated the procedure well.  Complications: None  Leotis Pain 07/06/2018 11:49 AM     This note was created with Dragon Medical transcription system. Any errors in dictation are purely unintentional.

## 2018-07-07 ENCOUNTER — Encounter: Payer: Self-pay | Admitting: Vascular Surgery

## 2018-08-21 ENCOUNTER — Other Ambulatory Visit (INDEPENDENT_AMBULATORY_CARE_PROVIDER_SITE_OTHER): Payer: Medicare HMO

## 2018-08-21 ENCOUNTER — Ambulatory Visit (INDEPENDENT_AMBULATORY_CARE_PROVIDER_SITE_OTHER): Payer: Medicare HMO | Admitting: Vascular Surgery

## 2018-08-24 DIAGNOSIS — Z720 Tobacco use: Secondary | ICD-10-CM | POA: Diagnosis not present

## 2018-08-24 DIAGNOSIS — J432 Centrilobular emphysema: Secondary | ICD-10-CM | POA: Diagnosis not present

## 2018-08-24 DIAGNOSIS — I480 Paroxysmal atrial fibrillation: Secondary | ICD-10-CM | POA: Diagnosis not present

## 2018-08-24 DIAGNOSIS — G4733 Obstructive sleep apnea (adult) (pediatric): Secondary | ICD-10-CM | POA: Diagnosis not present

## 2018-08-24 DIAGNOSIS — E1121 Type 2 diabetes mellitus with diabetic nephropathy: Secondary | ICD-10-CM | POA: Diagnosis not present

## 2018-08-24 DIAGNOSIS — Z951 Presence of aortocoronary bypass graft: Secondary | ICD-10-CM | POA: Diagnosis not present

## 2018-08-24 DIAGNOSIS — I10 Essential (primary) hypertension: Secondary | ICD-10-CM | POA: Diagnosis not present

## 2018-08-24 DIAGNOSIS — I25810 Atherosclerosis of coronary artery bypass graft(s) without angina pectoris: Secondary | ICD-10-CM | POA: Diagnosis not present

## 2018-08-24 DIAGNOSIS — M170 Bilateral primary osteoarthritis of knee: Secondary | ICD-10-CM | POA: Diagnosis not present

## 2018-08-31 DIAGNOSIS — Z951 Presence of aortocoronary bypass graft: Secondary | ICD-10-CM | POA: Diagnosis not present

## 2018-08-31 DIAGNOSIS — E1121 Type 2 diabetes mellitus with diabetic nephropathy: Secondary | ICD-10-CM | POA: Diagnosis not present

## 2018-08-31 DIAGNOSIS — J432 Centrilobular emphysema: Secondary | ICD-10-CM | POA: Diagnosis not present

## 2018-08-31 DIAGNOSIS — I25810 Atherosclerosis of coronary artery bypass graft(s) without angina pectoris: Secondary | ICD-10-CM | POA: Diagnosis not present

## 2018-08-31 DIAGNOSIS — I10 Essential (primary) hypertension: Secondary | ICD-10-CM | POA: Diagnosis not present

## 2018-08-31 DIAGNOSIS — Z8739 Personal history of other diseases of the musculoskeletal system and connective tissue: Secondary | ICD-10-CM | POA: Diagnosis not present

## 2018-08-31 DIAGNOSIS — Z0000 Encounter for general adult medical examination without abnormal findings: Secondary | ICD-10-CM | POA: Diagnosis not present

## 2018-08-31 DIAGNOSIS — N184 Chronic kidney disease, stage 4 (severe): Secondary | ICD-10-CM | POA: Diagnosis not present

## 2018-08-31 DIAGNOSIS — Z720 Tobacco use: Secondary | ICD-10-CM | POA: Diagnosis not present

## 2018-09-25 ENCOUNTER — Other Ambulatory Visit (INDEPENDENT_AMBULATORY_CARE_PROVIDER_SITE_OTHER): Payer: Self-pay | Admitting: Vascular Surgery

## 2018-09-25 DIAGNOSIS — Z9889 Other specified postprocedural states: Secondary | ICD-10-CM

## 2018-09-29 ENCOUNTER — Other Ambulatory Visit: Payer: Self-pay

## 2018-09-29 ENCOUNTER — Encounter (INDEPENDENT_AMBULATORY_CARE_PROVIDER_SITE_OTHER): Payer: Self-pay | Admitting: Vascular Surgery

## 2018-09-29 ENCOUNTER — Ambulatory Visit (INDEPENDENT_AMBULATORY_CARE_PROVIDER_SITE_OTHER): Payer: Medicare HMO

## 2018-09-29 ENCOUNTER — Ambulatory Visit (INDEPENDENT_AMBULATORY_CARE_PROVIDER_SITE_OTHER): Payer: Medicare HMO | Admitting: Vascular Surgery

## 2018-09-29 VITALS — BP 159/78 | HR 71 | Resp 16 | Ht 65.0 in | Wt 228.8 lb

## 2018-09-29 DIAGNOSIS — J449 Chronic obstructive pulmonary disease, unspecified: Secondary | ICD-10-CM

## 2018-09-29 DIAGNOSIS — I714 Abdominal aortic aneurysm, without rupture, unspecified: Secondary | ICD-10-CM

## 2018-09-29 DIAGNOSIS — I10 Essential (primary) hypertension: Secondary | ICD-10-CM

## 2018-09-29 DIAGNOSIS — N184 Chronic kidney disease, stage 4 (severe): Secondary | ICD-10-CM

## 2018-09-29 DIAGNOSIS — E119 Type 2 diabetes mellitus without complications: Secondary | ICD-10-CM

## 2018-09-29 DIAGNOSIS — F17210 Nicotine dependence, cigarettes, uncomplicated: Secondary | ICD-10-CM | POA: Diagnosis not present

## 2018-09-29 DIAGNOSIS — Z9889 Other specified postprocedural states: Secondary | ICD-10-CM | POA: Diagnosis not present

## 2018-09-29 DIAGNOSIS — I129 Hypertensive chronic kidney disease with stage 1 through stage 4 chronic kidney disease, or unspecified chronic kidney disease: Secondary | ICD-10-CM | POA: Diagnosis not present

## 2018-09-29 DIAGNOSIS — E1122 Type 2 diabetes mellitus with diabetic chronic kidney disease: Secondary | ICD-10-CM | POA: Diagnosis not present

## 2018-09-29 NOTE — Patient Instructions (Signed)
Abdominal Aortic Aneurysm    An aneurysm is a bulge in one of the blood vessels that carry blood away from the heart (artery). It happens when blood pushes up against a weak or damaged place in the wall of an artery. An abdominal aortic aneurysm happens in the main artery of the body (aorta).  Some aneurysms may not cause problems. If it grows, it can burst or tear, causing bleeding inside the body. This is an emergency. It needs to be treated right away.  What are the causes?  The exact cause of this condition is not known.  What increases the risk?  The following may make you more likely to get this condition:   Being a female who is 60 years of age or older.   Being white (Caucasian).   Using tobacco.   Having a family history of aneurysms.   Having the following conditions:  ? Hardening of the arteries (arteriosclerosis).  ? Inflammation of the walls of an artery (arteritis).  ? Certain genetic conditions.  ? Being very overweight (obesity).  ? An infection in the wall of the aorta (infectious aortitis).  ? High cholesterol.  ? High blood pressure (hypertension).  What are the signs or symptoms?  Symptoms depend on the size of the aneurysm and how fast it is growing. Most grow slowly and do not cause any symptoms. If symptoms do occur, they may include:   Pain in the belly (abdomen), side, or back.   Feeling full after eating only small amounts of food.   Feeling a throbbing lump in the belly.  Symptoms that the aneurysm has burst (ruptured) include:   Sudden, very bad pain in the belly, side, or back.   Feeling sick to your stomach (nauseous).   Throwing up (vomiting).   Feeling light-headed or passing out.  How is this treated?  Treatment for this condition depends on:   The size of the aneurysm.   How fast it is growing.   Your age.   Your risk of having it burst.  If your aneurysm is smaller than 2 inches (5 cm), your doctor may manage it by:   Checking it often to see if it is getting bigger.  You may have an imaging test (ultrasound) to check it every 3-6 months, every year, or every few years.   Giving you medicines to:  ? Control blood pressure.  ? Treat pain.  ? Fight infection.  If your aneurysm is larger than 2 inches (5 cm), you may need surgery to fix it.  Follow these instructions at home:  Lifestyle   Do not use any products that have nicotine or tobacco in them. This includes cigarettes, e-cigarettes, and chewing tobacco. If you need help quitting, ask your doctor.   Get regular exercise. Ask your doctor what types of exercise are best for you.  Eating and drinking   Eat a heart-healthy diet. This includes eating plenty of:  ? Fresh fruits and vegetables.  ? Whole grains.  ? Low-fat (lean) protein.  ? Low-fat dairy products.   Avoid foods that are high in saturated fat and cholesterol. These foods include red meat and some dairy products.   Do not drink alcohol if:  ? Your doctor tells you not to drink.  ? You are pregnant, may be pregnant, or are planning to become pregnant.   If you drink alcohol:  ? Limit how much you use to:   0-1 drink a day for women.     0-2 drinks a day for men.  ? Be aware of how much alcohol is in your drink. In the [place] one drink equals any of these:   One typical bottle of beer (12 oz).   One-half glass of wine (5 oz).   One shot of hard liquor (1 oz).  General instructions   Take over-the-counter and prescription medicines only as told by your doctor.   Keep your blood pressure within normal limits. Ask your doctor what your blood pressure should be.   Have your blood sugar (glucose) level and cholesterol levels checked regularly. Keep your blood sugar level and cholesterol levels within normal limits.   Avoid heavy lifting and activities that take a lot of effort. Ask your doctor what activities are safe for you.   Keep all follow-up visits as told by your doctor. This is important.  ? Talk to your doctor about regular screenings to see if the  aneurysm is getting bigger.  Contact a doctor if you:   Have pain in your belly, side, or back.   Have a throbbing feeling in your belly.   Have a family history of aneurysms.  Get help right away if you:   Have sudden, bad pain in your belly, side, or back.   Feel sick to your stomach.   Throw up.   Have trouble pooping (constipation).   Have trouble peeing (urinating).   Feel light-headed.   Have a fast heart rate when you stand.   Have sweaty skin that is cold to the touch (clammy).   Have shortness of breath.   Have a fever.  These symptoms may be an emergency. Do not wait to see if the symptoms will go away. Get medical help right away. Call your local emergency services (911 in the [place] Do not drive yourself to the hospital.  Summary   An aneurysm is a bulge in one of the blood vessels that carry blood away from the heart (artery). Some aneurysms may not cause problems.   You may need to have yours checked often. If it grows, it can burst or tear. This causes bleeding inside the body. It needs to be treated right away.   Follow instructions from your doctor about healthy lifestyle changes.   Keep all follow-up visits as told by your doctor. This is important.  This information is not intended to replace advice given to you by your health care provider. Make sure you discuss any questions you have with your health care provider.  Document Released: 09/07/2012 Document Revised: 12/20/2017 Document Reviewed: 12/20/2017  Elsevier Interactive Patient Education  2019 [employer]

## 2018-09-29 NOTE — Progress Notes (Signed)
MRN : [id number]  Michelle Murillo is a 73 y.o. (Oct 31, 1945) female who presents with chief complaint of  Chief Complaint  Patient presents with  . Follow-up    [facility] 6week EVAR  .  History of Present Illness: Patient returns today in follow up of aneurysm.  Her postoperative follow-up was delayed secondary to the COVID-19 restrictions, and she is now about 3 months status post coil embolization of the right iliac artery and feeding branches leading to a type II endoleak.  She says she had a very bad experience with anesthesia with that procedure and was much more awake than she had been on any previous procedure.  She also had more postoperative pain.  The pain went away in a few days and she has been doing well since that time.  Her duplex today shows no obvious endoleak into the aneurysm sac that was previously seen although there has not been any sac regression and a slight amount of sac growth.  Current Outpatient Medications  Medication Sig Dispense Refill  . allopurinol (ZYLOPRIM) 300 MG tablet Take 300 mg by mouth 3 (three) times a week.     Marland Kitchen aspirin EC 81 MG tablet Take 81 mg by mouth daily.    . furosemide (LASIX) 40 MG tablet Take 40 mg by mouth daily as needed (for fluid retention (swollen ankles)---typically no more than twice a week).     . gabapentin (NEURONTIN) 300 MG capsule Take 300 mg by mouth as needed.    Marland Kitchen glimepiride (AMARYL) 1 MG tablet as needed.    Marland Kitchen losartan (COZAAR) 100 MG tablet Take 100 mg by mouth daily.    . metoprolol tartrate (LOPRESSOR) 25 MG tablet Take 25 mg by mouth 2 (two) times daily.     Marland Kitchen omeprazole (PRILOSEC) 40 MG capsule Take 40 mg by mouth daily as needed (for acid reflux/indigestion.).    Marland Kitchen Vitamin D, Ergocalciferol, (DRISDOL) 50000 units CAPS capsule Take 50,000 Units by mouth every 30 (thirty) days.      No current facility-administered medications for this visit.     Past Medical History:  Diagnosis Date  . Abdominal aneurysm ([place])    . 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 CHF (congestive heart failure) ([place])   . Chronic kidney disease 2019   stage IV chronic kidney disease. now functions at 35%  . Complication of anesthesia 2013   "stops breathing and goes into convulsions".  pt was in a coma for 18 days after this.(during CABG)  . Complication of anesthesia    dr. Doris Cheadle is attending provider. surgery was at [facility]  . COPD (chronic obstructive pulmonary disease) ([place])   . Coronary artery disease   . Diabetes mellitus without complication (HCC)    borderline, does not take any medication  . GERD (gastroesophageal reflux disease)   . Gout   . Hypertension   . Legally blind    unable to see any more than shapes  . Seizures ([place]) 2013   only happened during cabg procedure while on the table    Past Surgical History:  Procedure Laterality Date  . ABDOMINAL AORTOGRAM N/A 06/23/2017   Procedure: ABDOMINAL AORTOGRAM;  Surgeon: Algernon Huxley, MD;  Location: [place] [facility];  Service: Cardiovascular;  Laterality: N/A;  . ABDOMINAL SURGERY  2017   aneurysm repair with dr.   . BRONCHOSCOPY  2015   done at [place]. carcinoid of  airways  . BYPASS GRAFT  2013   cabg x 4  . CARDIAC SURGERY  2013   cabg  . CHOLECYSTECTOMY     age 49  . CORONARY ARTERY BYPASS GRAFT    . ENDOVASCULAR REPAIR/STENT GRAFT N/A 07/06/2018   Procedure: ENDOVASCULAR REPAIR/STENT GRAFT;  Surgeon: Algernon Huxley, MD;  Location: [place] [facility];  Service: Cardiovascular;  Laterality: N/A;   Social History  Substance Use Topics  . Smoking status: Current Every Day Smoker    Packs/day: 0.50    Types: Cigarettes  . Smokeless tobacco: Never Used  . Alcohol use No  lives alone  Family History No bleeding or clotting disorders No aneurysms, autoimmune diseases, or porphyria       Allergies  Allergen Reactions  . Bupropion Nausea Only    Other reaction(s): Other (See Comments) "I  didn't do well with it."  . Morphine And Related Hives     REVIEW OF SYSTEMS(Negative unless checked)  Constitutional: [] ??Weight loss[] ??Fever[] ??Chills Cardiac:[] ??Chest pain[] ??Chest pressure[] ??Palpitations [] ??Shortness of breath when laying flat [] ??Shortness of breath at rest [] ??Shortness of breath with exertion. Vascular: [] ??Pain in legs with walking[] ??Pain in legsat rest[] ??Pain in legs when laying flat [] ??Claudication [] ??Pain in feet when walking [] ??Pain in feet at rest [] ??Pain in feet when laying flat [] ??History of DVT [] ??Phlebitis [x] ??Swelling in legs [] ??Varicose veins [] ??Non-healing ulcers Pulmonary: [] ??Uses home oxygen [] ??Productive cough[] ??Hemoptysis [] ??Wheeze [x] ??COPD [] ??Asthma Neurologic: [] ??Dizziness [] ??Blackouts [] ??Seizures [] ??History of stroke [] ??History of TIA[] ??Aphasia [] ??Temporary blindness[] ??Dysphagia [] ??Weaknessor numbness in arms [] ??Weakness or numbnessin legs Musculoskeletal: [x] ??Arthritis [] ??Joint swelling [x] ??Joint pain [] ??Low back pain Hematologic:[] ??Easy bruising[] ??Easy bleeding [] ??Hypercoagulable state [] ??Anemic  Gastrointestinal:[] ??Blood in stool[] ??Vomiting blood[x] ??Gastroesophageal reflux/heartburn[x] ??Abdominal pain Genitourinary: [x] ??Chronic kidney disease [] ??Difficulturination [] ??Frequenturination [] ??Burning with urination[] ??Hematuria Skin: [] ??Rashes [] ??Ulcers [] ??Wounds Psychological: [] ??History of anxiety[] ??History of major depression.    Physical Examination  BP (!) 159/78 (BP Location: Right Arm)   Pulse 71   Resp 16   Ht 5\' 5"  (1.651 m)   Wt 228 lb 12.8 oz (103.8 kg)   BMI 38.07 kg/m  Gen:  WD/WN, NAD Head: [place]/AT, No temporalis wasting. Ear/Nose/Throat: Hearing grossly intact, nares w/o erythema or drainage Eyes: Conjunctiva clear. Sclera non-icteric. Visual acuity poor.  Neck: Supple.  Trachea midline Pulmonary:  Good air movement, no use of accessory muscles.  Cardiac: RRR, no JVD Vascular:  Vessel Right Left  Radial Palpable Palpable                           Musculoskeletal: M/S 5/5 throughout.  No deformity or atrophy. Trace LE edema. Neurologic: Sensation grossly intact in extremities.  Symmetrical.  Speech is fluent.  Psychiatric: Judgment intact, Mood & affect appropriate for pt's clinical situation. Dermatologic: No rashes or ulcers noted.  No cellulitis or open wounds.       Labs Recent Results (from the past 2160 hour(s))  BUN     Status: Abnormal   Collection Time: 07/06/18  9:30 AM  Result Value Ref Range   BUN 37 (H) 8 - 23 mg/dL    Comment: Performed at [facility], [address] [place], [place] [address]  Creatinine, serum     Status: Abnormal   Collection Time: 07/06/18  9:30 AM  Result Value Ref Range   Creatinine, Ser 2.27 (H) 0.44 - 1.00 mg/dL   GFR calc non Af Amer 21 (L) >60 mL/min   GFR calc Af Amer 24 (L) >60 mL/min    Comment: Performed at [facility], [address] [place], [place] [address]    Radiology No results found.  Assessment/Plan Hypertension blood pressure control important in reducing the progression of atherosclerotic disease and aneurysmal growth. On appropriate oral medications.   COPD (chronic obstructive pulmonary disease) ([place]) Fairly significant and has been told she cannot have general anesthesia anymore.  Diabetes mellitus without complication (HCC) blood glucose control important in reducing the progression of atherosclerotic disease. Also, involved in wound healing. On appropriate medications.  Chronic kidney disease (CKD), stage IV (severe) (HCC) Limitation in contrast with any procedures.  Avoid CT scans if at all possible if they need contrast.  AAA (abdominal aortic aneurysm) without rupture (HCC) Her duplex today shows no obvious endoleak into  the aneurysm sac that was previously seen although there has not been any sac regression and a slight amount of sac growth.  With no endoleak present, this is encouraging and at this point I will stretch her follow-up out to 6 months.  We will perform a duplex at that time.  She will contact our office with any problems in the interim.    Leotis Pain, MD  09/29/2018 11:02 AM    This note was created with Dragon medical transcription system.  Any errors from dictation are purely unintentional

## 2018-09-29 NOTE — Assessment & Plan Note (Signed)
Her duplex today shows no obvious endoleak into the aneurysm sac that was previously seen although there has not been any sac regression and a slight amount of sac growth.  With no endoleak present, this is encouraging and at this point I will stretch her follow-up out to 6 months.  We will perform a duplex at that time.  She will contact our office with any problems in the interim.

## 2018-10-23 DIAGNOSIS — I25708 Atherosclerosis of coronary artery bypass graft(s), unspecified, with other forms of angina pectoris: Secondary | ICD-10-CM | POA: Diagnosis not present

## 2018-10-23 DIAGNOSIS — E7800 Pure hypercholesterolemia, unspecified: Secondary | ICD-10-CM | POA: Diagnosis not present

## 2018-10-23 DIAGNOSIS — I10 Essential (primary) hypertension: Secondary | ICD-10-CM | POA: Diagnosis not present

## 2018-11-06 DIAGNOSIS — I25708 Atherosclerosis of coronary artery bypass graft(s), unspecified, with other forms of angina pectoris: Secondary | ICD-10-CM | POA: Diagnosis not present

## 2018-11-12 DIAGNOSIS — I10 Essential (primary) hypertension: Secondary | ICD-10-CM | POA: Diagnosis not present

## 2018-11-12 DIAGNOSIS — I25810 Atherosclerosis of coronary artery bypass graft(s) without angina pectoris: Secondary | ICD-10-CM | POA: Diagnosis not present

## 2018-11-12 DIAGNOSIS — E7800 Pure hypercholesterolemia, unspecified: Secondary | ICD-10-CM | POA: Diagnosis not present

## 2018-11-12 DIAGNOSIS — I480 Paroxysmal atrial fibrillation: Secondary | ICD-10-CM | POA: Diagnosis not present

## 2018-11-12 DIAGNOSIS — N183 Chronic kidney disease, stage 3 (moderate): Secondary | ICD-10-CM | POA: Diagnosis not present

## 2019-01-11 DIAGNOSIS — I10 Essential (primary) hypertension: Secondary | ICD-10-CM | POA: Diagnosis not present

## 2019-01-11 DIAGNOSIS — Z720 Tobacco use: Secondary | ICD-10-CM | POA: Diagnosis not present

## 2019-01-11 DIAGNOSIS — J432 Centrilobular emphysema: Secondary | ICD-10-CM | POA: Diagnosis not present

## 2019-01-11 DIAGNOSIS — E1121 Type 2 diabetes mellitus with diabetic nephropathy: Secondary | ICD-10-CM | POA: Diagnosis not present

## 2019-01-11 DIAGNOSIS — Z79899 Other long term (current) drug therapy: Secondary | ICD-10-CM | POA: Diagnosis not present

## 2019-01-11 DIAGNOSIS — I25810 Atherosclerosis of coronary artery bypass graft(s) without angina pectoris: Secondary | ICD-10-CM | POA: Diagnosis not present

## 2019-01-11 DIAGNOSIS — D649 Anemia, unspecified: Secondary | ICD-10-CM | POA: Diagnosis not present

## 2019-01-11 DIAGNOSIS — N184 Chronic kidney disease, stage 4 (severe): Secondary | ICD-10-CM | POA: Diagnosis not present

## 2019-01-11 DIAGNOSIS — Z951 Presence of aortocoronary bypass graft: Secondary | ICD-10-CM | POA: Diagnosis not present

## 2019-03-26 ENCOUNTER — Other Ambulatory Visit: Payer: Self-pay | Admitting: Internal Medicine

## 2019-03-26 DIAGNOSIS — Z1231 Encounter for screening mammogram for malignant neoplasm of breast: Secondary | ICD-10-CM

## 2019-05-18 DIAGNOSIS — Z720 Tobacco use: Secondary | ICD-10-CM | POA: Diagnosis not present

## 2019-05-18 DIAGNOSIS — I10 Essential (primary) hypertension: Secondary | ICD-10-CM | POA: Diagnosis not present

## 2019-05-18 DIAGNOSIS — N184 Chronic kidney disease, stage 4 (severe): Secondary | ICD-10-CM | POA: Diagnosis not present

## 2019-05-18 DIAGNOSIS — D649 Anemia, unspecified: Secondary | ICD-10-CM | POA: Diagnosis not present

## 2019-05-18 DIAGNOSIS — J432 Centrilobular emphysema: Secondary | ICD-10-CM | POA: Diagnosis not present

## 2019-05-18 DIAGNOSIS — Z951 Presence of aortocoronary bypass graft: Secondary | ICD-10-CM | POA: Diagnosis not present

## 2019-05-18 DIAGNOSIS — E1121 Type 2 diabetes mellitus with diabetic nephropathy: Secondary | ICD-10-CM | POA: Diagnosis not present

## 2019-05-18 DIAGNOSIS — I25810 Atherosclerosis of coronary artery bypass graft(s) without angina pectoris: Secondary | ICD-10-CM | POA: Diagnosis not present

## 2019-06-08 DIAGNOSIS — Z20828 Contact with and (suspected) exposure to other viral communicable diseases: Secondary | ICD-10-CM | POA: Diagnosis not present

## 2019-09-01 DIAGNOSIS — R441 Visual hallucinations: Secondary | ICD-10-CM | POA: Diagnosis not present

## 2019-09-01 DIAGNOSIS — E1122 Type 2 diabetes mellitus with diabetic chronic kidney disease: Secondary | ICD-10-CM | POA: Diagnosis not present

## 2019-09-01 DIAGNOSIS — Z79899 Other long term (current) drug therapy: Secondary | ICD-10-CM | POA: Diagnosis not present

## 2019-09-01 DIAGNOSIS — N184 Chronic kidney disease, stage 4 (severe): Secondary | ICD-10-CM | POA: Diagnosis not present

## 2019-09-01 DIAGNOSIS — E669 Obesity, unspecified: Secondary | ICD-10-CM | POA: Diagnosis not present

## 2019-09-01 DIAGNOSIS — Z6837 Body mass index (BMI) 37.0-37.9, adult: Secondary | ICD-10-CM | POA: Diagnosis not present

## 2019-09-01 DIAGNOSIS — J449 Chronic obstructive pulmonary disease, unspecified: Secondary | ICD-10-CM | POA: Diagnosis not present

## 2019-09-01 DIAGNOSIS — Z0000 Encounter for general adult medical examination without abnormal findings: Secondary | ICD-10-CM | POA: Diagnosis not present

## 2019-09-01 DIAGNOSIS — I129 Hypertensive chronic kidney disease with stage 1 through stage 4 chronic kidney disease, or unspecified chronic kidney disease: Secondary | ICD-10-CM | POA: Diagnosis not present

## 2019-12-07 DIAGNOSIS — N184 Chronic kidney disease, stage 4 (severe): Secondary | ICD-10-CM | POA: Diagnosis not present

## 2019-12-07 DIAGNOSIS — D631 Anemia in chronic kidney disease: Secondary | ICD-10-CM | POA: Diagnosis not present

## 2019-12-07 DIAGNOSIS — H3530 Unspecified macular degeneration: Secondary | ICD-10-CM | POA: Diagnosis not present

## 2019-12-07 DIAGNOSIS — R809 Proteinuria, unspecified: Secondary | ICD-10-CM | POA: Diagnosis not present

## 2019-12-07 DIAGNOSIS — N2581 Secondary hyperparathyroidism of renal origin: Secondary | ICD-10-CM | POA: Diagnosis not present

## 2019-12-07 DIAGNOSIS — I129 Hypertensive chronic kidney disease with stage 1 through stage 4 chronic kidney disease, or unspecified chronic kidney disease: Secondary | ICD-10-CM | POA: Diagnosis not present

## 2019-12-29 DIAGNOSIS — Z951 Presence of aortocoronary bypass graft: Secondary | ICD-10-CM | POA: Diagnosis not present

## 2019-12-29 DIAGNOSIS — Z720 Tobacco use: Secondary | ICD-10-CM | POA: Diagnosis not present

## 2019-12-29 DIAGNOSIS — H543 Unqualified visual loss, both eyes: Secondary | ICD-10-CM | POA: Diagnosis not present

## 2019-12-29 DIAGNOSIS — J432 Centrilobular emphysema: Secondary | ICD-10-CM | POA: Diagnosis not present

## 2019-12-29 DIAGNOSIS — I10 Essential (primary) hypertension: Secondary | ICD-10-CM | POA: Diagnosis not present

## 2019-12-29 DIAGNOSIS — I25810 Atherosclerosis of coronary artery bypass graft(s) without angina pectoris: Secondary | ICD-10-CM | POA: Diagnosis not present

## 2019-12-29 DIAGNOSIS — N184 Chronic kidney disease, stage 4 (severe): Secondary | ICD-10-CM | POA: Diagnosis not present

## 2019-12-29 DIAGNOSIS — E1122 Type 2 diabetes mellitus with diabetic chronic kidney disease: Secondary | ICD-10-CM | POA: Diagnosis not present

## 2019-12-29 DIAGNOSIS — E7800 Pure hypercholesterolemia, unspecified: Secondary | ICD-10-CM | POA: Diagnosis not present

## 2020-01-13 DIAGNOSIS — I129 Hypertensive chronic kidney disease with stage 1 through stage 4 chronic kidney disease, or unspecified chronic kidney disease: Secondary | ICD-10-CM | POA: Diagnosis not present

## 2020-01-13 DIAGNOSIS — E1122 Type 2 diabetes mellitus with diabetic chronic kidney disease: Secondary | ICD-10-CM | POA: Diagnosis not present

## 2020-01-13 DIAGNOSIS — R809 Proteinuria, unspecified: Secondary | ICD-10-CM | POA: Diagnosis not present

## 2020-01-13 DIAGNOSIS — N184 Chronic kidney disease, stage 4 (severe): Secondary | ICD-10-CM | POA: Diagnosis not present

## 2020-01-13 DIAGNOSIS — N2581 Secondary hyperparathyroidism of renal origin: Secondary | ICD-10-CM | POA: Diagnosis not present

## 2020-01-13 DIAGNOSIS — D631 Anemia in chronic kidney disease: Secondary | ICD-10-CM | POA: Diagnosis not present

## 2020-02-02 DIAGNOSIS — I714 Abdominal aortic aneurysm, without rupture: Secondary | ICD-10-CM | POA: Diagnosis not present

## 2020-02-02 DIAGNOSIS — I10 Essential (primary) hypertension: Secondary | ICD-10-CM | POA: Diagnosis not present

## 2020-02-02 DIAGNOSIS — I480 Paroxysmal atrial fibrillation: Secondary | ICD-10-CM | POA: Diagnosis not present

## 2020-02-02 DIAGNOSIS — I25810 Atherosclerosis of coronary artery bypass graft(s) without angina pectoris: Secondary | ICD-10-CM | POA: Diagnosis not present

## 2020-02-02 DIAGNOSIS — Z23 Encounter for immunization: Secondary | ICD-10-CM | POA: Diagnosis not present

## 2020-02-11 DIAGNOSIS — I25810 Atherosclerosis of coronary artery bypass graft(s) without angina pectoris: Secondary | ICD-10-CM | POA: Diagnosis not present

## 2020-02-21 DIAGNOSIS — Z951 Presence of aortocoronary bypass graft: Secondary | ICD-10-CM | POA: Diagnosis not present

## 2020-02-21 DIAGNOSIS — I25810 Atherosclerosis of coronary artery bypass graft(s) without angina pectoris: Secondary | ICD-10-CM | POA: Diagnosis not present

## 2020-02-21 DIAGNOSIS — Z01818 Encounter for other preprocedural examination: Secondary | ICD-10-CM | POA: Diagnosis not present

## 2020-02-21 DIAGNOSIS — E7800 Pure hypercholesterolemia, unspecified: Secondary | ICD-10-CM | POA: Diagnosis not present

## 2020-02-21 DIAGNOSIS — E1122 Type 2 diabetes mellitus with diabetic chronic kidney disease: Secondary | ICD-10-CM | POA: Diagnosis not present

## 2020-02-21 DIAGNOSIS — Z23 Encounter for immunization: Secondary | ICD-10-CM | POA: Diagnosis not present

## 2020-02-21 DIAGNOSIS — Z720 Tobacco use: Secondary | ICD-10-CM | POA: Diagnosis not present

## 2020-02-21 DIAGNOSIS — R6889 Other general symptoms and signs: Secondary | ICD-10-CM | POA: Diagnosis not present

## 2020-02-21 DIAGNOSIS — I209 Angina pectoris, unspecified: Secondary | ICD-10-CM | POA: Diagnosis present

## 2020-02-21 DIAGNOSIS — Z6837 Body mass index (BMI) 37.0-37.9, adult: Secondary | ICD-10-CM | POA: Diagnosis not present

## 2020-02-21 DIAGNOSIS — I471 Supraventricular tachycardia: Secondary | ICD-10-CM | POA: Diagnosis not present

## 2020-02-21 DIAGNOSIS — I10 Essential (primary) hypertension: Secondary | ICD-10-CM | POA: Diagnosis not present

## 2020-02-21 DIAGNOSIS — R5381 Other malaise: Secondary | ICD-10-CM | POA: Diagnosis not present

## 2020-02-21 DIAGNOSIS — Z1211 Encounter for screening for malignant neoplasm of colon: Secondary | ICD-10-CM | POA: Diagnosis not present

## 2020-02-21 DIAGNOSIS — N184 Chronic kidney disease, stage 4 (severe): Secondary | ICD-10-CM | POA: Diagnosis not present

## 2020-02-21 DIAGNOSIS — N1832 Chronic kidney disease, stage 3b: Secondary | ICD-10-CM | POA: Diagnosis not present

## 2020-02-21 DIAGNOSIS — I129 Hypertensive chronic kidney disease with stage 1 through stage 4 chronic kidney disease, or unspecified chronic kidney disease: Secondary | ICD-10-CM | POA: Diagnosis not present

## 2020-02-24 DIAGNOSIS — N184 Chronic kidney disease, stage 4 (severe): Secondary | ICD-10-CM | POA: Diagnosis not present

## 2020-02-24 DIAGNOSIS — I129 Hypertensive chronic kidney disease with stage 1 through stage 4 chronic kidney disease, or unspecified chronic kidney disease: Secondary | ICD-10-CM | POA: Diagnosis not present

## 2020-02-24 DIAGNOSIS — N2581 Secondary hyperparathyroidism of renal origin: Secondary | ICD-10-CM | POA: Diagnosis not present

## 2020-02-24 DIAGNOSIS — E875 Hyperkalemia: Secondary | ICD-10-CM | POA: Diagnosis not present

## 2020-02-24 DIAGNOSIS — D631 Anemia in chronic kidney disease: Secondary | ICD-10-CM | POA: Diagnosis not present

## 2020-02-24 DIAGNOSIS — R809 Proteinuria, unspecified: Secondary | ICD-10-CM | POA: Diagnosis not present

## 2020-02-24 DIAGNOSIS — E1122 Type 2 diabetes mellitus with diabetic chronic kidney disease: Secondary | ICD-10-CM | POA: Diagnosis not present

## 2020-02-29 DIAGNOSIS — N289 Disorder of kidney and ureter, unspecified: Secondary | ICD-10-CM | POA: Diagnosis not present

## 2020-03-03 ENCOUNTER — Other Ambulatory Visit
Admission: RE | Admit: 2020-03-03 | Discharge: 2020-03-03 | Disposition: A | Discharge status: Home / Self Care | Payer: Medicare HMO | Source: Ambulatory Visit | Attending: Internal Medicine | Admitting: Internal Medicine

## 2020-03-03 ENCOUNTER — Other Ambulatory Visit: Payer: Self-pay

## 2020-03-03 DIAGNOSIS — Z01812 Encounter for preprocedural laboratory examination: Secondary | ICD-10-CM | POA: Diagnosis not present

## 2020-03-03 DIAGNOSIS — Z20822 Contact with and (suspected) exposure to covid-19: Secondary | ICD-10-CM | POA: Insufficient documentation

## 2020-03-03 LAB — SARS CORONAVIRUS 2 (TAT 6-24 HRS): SARS Coronavirus 2: NEGATIVE

## 2020-03-07 ENCOUNTER — Encounter
Admission: RE | Disposition: A | Discharge status: Home / Self Care | Payer: Self-pay | Source: Home / Self Care | Attending: Internal Medicine

## 2020-03-07 ENCOUNTER — Encounter: Payer: Self-pay | Admitting: Internal Medicine

## 2020-03-07 ENCOUNTER — Ambulatory Visit
Admission: RE | Admit: 2020-03-07 | Discharge: 2020-03-07 | Disposition: A | Discharge status: Home / Self Care | Payer: Medicare HMO | Attending: Internal Medicine | Admitting: Internal Medicine

## 2020-03-07 ENCOUNTER — Other Ambulatory Visit: Payer: Self-pay

## 2020-03-07 DIAGNOSIS — E785 Hyperlipidemia, unspecified: Secondary | ICD-10-CM | POA: Insufficient documentation

## 2020-03-07 DIAGNOSIS — N189 Chronic kidney disease, unspecified: Secondary | ICD-10-CM | POA: Diagnosis not present

## 2020-03-07 DIAGNOSIS — I25110 Atherosclerotic heart disease of native coronary artery with unstable angina pectoris: Secondary | ICD-10-CM | POA: Insufficient documentation

## 2020-03-07 DIAGNOSIS — I25798 Atherosclerosis of other coronary artery bypass graft(s) with other forms of angina pectoris: Secondary | ICD-10-CM | POA: Diagnosis not present

## 2020-03-07 DIAGNOSIS — I129 Hypertensive chronic kidney disease with stage 1 through stage 4 chronic kidney disease, or unspecified chronic kidney disease: Secondary | ICD-10-CM | POA: Diagnosis not present

## 2020-03-07 DIAGNOSIS — I97610 Postprocedural hemorrhage and hematoma of a circulatory system organ or structure following a cardiac catheterization: Secondary | ICD-10-CM | POA: Diagnosis not present

## 2020-03-07 DIAGNOSIS — Z951 Presence of aortocoronary bypass graft: Secondary | ICD-10-CM | POA: Insufficient documentation

## 2020-03-07 DIAGNOSIS — R9430 Abnormal result of cardiovascular function study, unspecified: Secondary | ICD-10-CM

## 2020-03-07 DIAGNOSIS — I209 Angina pectoris, unspecified: Secondary | ICD-10-CM | POA: Diagnosis present

## 2020-03-07 HISTORY — PX: LEFT HEART CATH AND CORS/GRAFTS ANGIOGRAPHY: CATH118250

## 2020-03-07 LAB — GLUCOSE, CAPILLARY: Glucose-Capillary: 97 mg/dL (ref 70–99)

## 2020-03-07 SURGERY — LEFT HEART CATH AND CORS/GRAFTS ANGIOGRAPHY
Anesthesia: Moderate Sedation | Laterality: Left

## 2020-03-07 MED ORDER — IOHEXOL 300 MG/ML  SOLN
INTRAMUSCULAR | Status: DC | PRN
  Administered 2020-03-07: 210 mL

## 2020-03-07 MED ORDER — SODIUM CHLORIDE 0.9% FLUSH: 3.0000 mL | Freq: Two times a day (BID) | INTRAVENOUS | Status: DC

## 2020-03-07 MED ORDER — MIDAZOLAM HCL 2 MG/2ML IJ SOLN
2.0000 mg | Freq: Once | INTRAMUSCULAR | Status: AC
  Administered 2020-03-07: 2 mg via INTRAVENOUS

## 2020-03-07 MED ORDER — HYDRALAZINE HCL 20 MG/ML IJ SOLN: 10.0000 mg | INTRAMUSCULAR | Status: DC | PRN

## 2020-03-07 MED ORDER — SODIUM CHLORIDE 0.9% FLUSH: 3.0000 mL | INTRAVENOUS | Status: DC | PRN

## 2020-03-07 MED ORDER — MIDAZOLAM HCL 2 MG/2ML IJ SOLN
INTRAMUSCULAR | Status: DC | PRN
  Administered 2020-03-07 (×2): 1 mg via INTRAVENOUS

## 2020-03-07 MED ORDER — LABETALOL HCL 5 MG/ML IV SOLN: 10.0000 mg | INTRAVENOUS | Status: DC | PRN

## 2020-03-07 MED ORDER — ONDANSETRON HCL 4 MG/2ML IJ SOLN
INTRAMUSCULAR | Status: AC
  Administered 2020-03-07: 2 mg
  Filled 2020-03-07: qty 2

## 2020-03-07 MED ORDER — SODIUM CHLORIDE 0.9 % IV SOLN: 250.0000 mL | INTRAVENOUS | Status: DC | PRN

## 2020-03-07 MED ORDER — SODIUM CHLORIDE 0.9 % WEIGHT BASED INFUSION: 1.0000 mL/kg/h | INTRAVENOUS | Status: DC

## 2020-03-07 MED ORDER — HEPARIN (PORCINE) IN NACL 2000-0.9 UNIT/L-% IV SOLN
INTRAVENOUS | Status: DC | PRN
  Administered 2020-03-07: 1000 mL

## 2020-03-07 MED ORDER — MIDAZOLAM HCL 2 MG/2ML IJ SOLN
INTRAMUSCULAR | Status: AC
  Filled 2020-03-07: qty 2

## 2020-03-07 MED ORDER — HEPARIN (PORCINE) IN NACL 1000-0.9 UT/500ML-% IV SOLN
INTRAVENOUS | Status: AC
  Filled 2020-03-07: qty 1000

## 2020-03-07 MED ORDER — ASPIRIN 81 MG PO CHEW: 81.0000 mg | CHEWABLE_TABLET | ORAL | Status: DC

## 2020-03-07 MED ORDER — FENTANYL CITRATE (PF) 100 MCG/2ML IJ SOLN
INTRAMUSCULAR | Status: AC
  Filled 2020-03-07: qty 2

## 2020-03-07 MED ORDER — FENTANYL CITRATE (PF) 100 MCG/2ML IJ SOLN
INTRAMUSCULAR | Status: DC | PRN
  Administered 2020-03-07 (×3): 25 ug via INTRAVENOUS

## 2020-03-07 MED ORDER — ONDANSETRON HCL 4 MG/2ML IJ SOLN: 4.0000 mg | Freq: Four times a day (QID) | INTRAMUSCULAR | Status: DC | PRN

## 2020-03-07 MED ORDER — SODIUM CHLORIDE 0.9 % WEIGHT BASED INFUSION: 315.6000 mL/h | INTRAVENOUS | Status: AC

## 2020-03-07 MED ORDER — ACETAMINOPHEN 325 MG PO TABS: 650.0000 mg | ORAL_TABLET | ORAL | Status: DC | PRN

## 2020-03-07 SURGICAL SUPPLY — 18 items
CATH INFINITI 5 FR IM (CATHETERS) ×2 IMPLANT
CATH INFINITI 5FR JL4 (CATHETERS) ×2 IMPLANT
CATH INFINITI JR4 5F (CATHETERS) ×2 IMPLANT
DEVICE STARCLOSE SE CLOSURE (Vascular Products) ×2 IMPLANT
GUIDEWIRE .025 260CM (WIRE) ×2 IMPLANT
KIT MANI 3VAL PERCEP (MISCELLANEOUS) ×2 IMPLANT
KIT RIGHT HEART (MISCELLANEOUS) ×2 IMPLANT
NEEDLE PERC 18GX7CM (NEEDLE) ×2 IMPLANT
PACK CARDIAC CATH (CUSTOM PROCEDURE TRAY) ×2 IMPLANT
PANNUS RETENTION SYSTEM 2 PAD (MISCELLANEOUS) ×2 IMPLANT
PROTECTION STATION PRESSURIZED (MISCELLANEOUS) ×2
SHEATH AVANTI 5FR X 11CM (SHEATH) ×2 IMPLANT
SHEATH BRITE TIP 6FR X 23 (SHEATH) ×2 IMPLANT
STATION PROTECTION PRESSURIZED (MISCELLANEOUS) ×1 IMPLANT
SYR CONTROL 10ML ANGIOGRAPHIC (SYRINGE) ×2 IMPLANT
TUBING CIL FLEX 10 FLL-RA (TUBING) ×2 IMPLANT
WIRE EMERALD 3MM-J .035X260CM (WIRE) ×2 IMPLANT
WIRE GUIDERIGHT .035X150 (WIRE) ×2 IMPLANT

## 2020-03-07 NOTE — Discharge Instructions (Signed)
Coronary Angiogram A coronary angiogram is an X-ray procedure that is used to examine the arteries in the heart. Contrast dye is injected through a long, thin tube (catheter) into these arteries. Then X-rays are taken to show any blockage in these arteries. You may have this procedure if you: Are having chest pain, or other symptoms of angina, and you are at risk for heart disease. Have an abnormal stress test or test of your heart's electrical activity (electrocardiogram, or ECG). Have chest pain and heart failure. Are having irregular heart rhythms. A coronary angiogram or heart catheterization can show if you have valve disease or a disease of the aorta. This procedure can also be used to check the overall function of your heart muscle. Let your health care provider know about: Any allergies you have, including allergies to medicines or contrast dye. All medicines you are taking, including vitamins, herbs, eye drops, creams, and over-the-counter medicines. Any problems you or family members have had with anesthetic medicines. Any blood disorders you have. Any surgeries you have had. Any history of kidney problems or kidney failure. Any medical conditions you have. Whether you are pregnant or may be pregnant. Whether you are breastfeeding. What are the risks? Generally, this is a safe procedure. However, problems may occur, including: Infection. Allergic reaction to medicines or dyes that are used. Bleeding from the insertion site or other places. Damage to nearby structures, such as blood vessels, or damage to kidneys from contrast dye. Irregular heart rhythms. Stroke (rare). Heart attack (rare). What happens before the procedure? Staying hydrated Follow instructions from your health care provider about hydration, which may include: Up to 2 hours before the procedure - you may continue to drink clear liquids, such as water, clear fruit juice, black coffee, and plain tea.  Eating  and drinking restrictions Follow instructions from your health care provider about eating and drinking, which may include: 8 hours before the procedure - stop eating heavy meals or foods, such as meat, fried foods, or fatty foods. 6 hours before the procedure - stop eating light meals or foods, such as toast or cereal. 6 hours before the procedure - stop drinking milk or drinks that contain milk. 2 hours before the procedure - stop drinking clear liquids. Medicines Ask your health care provider about: Changing or stopping your regular medicines. This is especially important if you are taking diabetes medicines or blood thinners. Taking medicines such as aspirin and ibuprofen. These medicines can thin your blood. Do not take these medicines unless your health care provider tells you to take them. Aspirin may be recommended before coronary angiograms even if you do not normally take it. Taking over-the-counter medicines, vitamins, herbs, and supplements. General instructions Do not use any products that contain nicotine or tobacco for at least 4 weeks before the procedure. These products include cigarettes, e-cigarettes, and chewing tobacco. If you need help quitting, ask your health care provider. You may have an exam or testing. Plan to have someone take you home from the hospital or clinic. If you will be going home right after the procedure, plan to have someone with you for 24 hours. Ask your health care provider: How your insertion site will be marked. What steps will be taken to help prevent infection. These may include: Removing hair at the insertion site. Washing skin with a germ-killing soap. Taking antibiotic medicine. What happens during the procedure?  You will lie on your back on an X-ray table. An IV will be inserted into  one of your veins. Electrodes will be placed on your chest. You will be given one or more of the following: A medicine to help you relax (sedative). A  medicine to numb the catheter insertion area (local anesthetic). You will be connected to a continuous ECG monitor. The catheter will be inserted into an artery in one of these areas: Your groin area in your upper thigh. Your wrist. The fold of your arm, near your elbow. An X-ray procedure (fluoroscopy) will be used to help guide the catheter to the opening of the blood vessel to be used. A dye will be injected into the catheter and X-rays will be taken. The dye will help to show any narrowing or blockages in the heart arteries. Tell your health care provider if you have chest pain or trouble breathing. If blockages are found, another procedure may be done to open the artery. The catheter will be removed after the fluoroscopy is complete. A bandage (dressing) will be placed over the insertion site. Pressure will be applied to stop bleeding. The IV will be removed. The procedure may vary among health care providers and hospitals. What happens after the procedure? Your blood pressure, heart rate, breathing rate, and blood oxygen level will be monitored until you leave the hospital or clinic. You will need to lie still for a few hours, or for as long as told by your health care provider. If the procedure is done through the groin, you will be told not to bend or cross your legs. The insertion site and the pulse in your foot or wrist will be checked often. More blood tests, X-rays, and an ECG may be done. Do not drive for 24 hours if you were given a sedative during your procedure. Summary A coronary angiogram is an X-ray procedure that is used to examine the arteries in the heart. Contrast dye is injected through a long, thin tube (catheter) into each artery. Tell your health care provider about any allergies you have, including allergies to contrast dye. After the procedure, you will need to lie still for a few hours and drink plenty of fluids. This information is not intended to replace  advice given to you by your health care provider. Make sure you discuss any questions you have with your health care provider. Document Revised: 12/03/2018 Document Reviewed: 12/03/2018 Elsevier Patient Education  [place]. Femoral Site Care This sheet gives you information about how to care for yourself after your procedure. Your health care provider may also give you more specific instructions. If you have problems or questions, contact your health care provider. What can I expect after the procedure? After the procedure, it is common to have:  Bruising that usually fades within 1-2 weeks.  Tenderness at the site. Follow these instructions at home: Wound care  Follow instructions from your health care provider about how to take care of your insertion site. Make sure you: ? Wash your hands with soap and water before you change your bandage (dressing). If soap and water are not available, use hand sanitizer. ? Change your dressing as told by your health care provider. ? Leave stitches (sutures), skin glue, or adhesive strips in place. These skin closures may need to stay in place for 2 weeks or longer. If adhesive strip edges start to loosen and curl up, you may trim the loose edges. Do not remove adhesive strips completely unless your health care provider tells you to do that.  Do not take baths, swim,  or use a hot tub until your health care provider approves.  You may shower 24-48 hours after the procedure or as told by your health care provider. ? Gently wash the site with plain soap and water. ? Pat the area dry with a clean towel. ? Do not rub the site. This may cause bleeding.  Do not apply powder or lotion to the site. Keep the site clean and dry.  Check your femoral site every day for signs of infection. Check for: ? Redness, swelling, or pain. ? Fluid or blood. ? Warmth. ? Pus or a bad smell. Activity  For the first 2-3 days after your procedure, or as long as  directed: ? Avoid climbing stairs as much as possible. ? Do not squat.  Do not lift anything that is heavier than 10 lb (4.5 kg), or the limit that you are told, until your health care provider says that it is safe.  Rest as directed. ? Avoid sitting for a long time without moving. Get up to take short walks every 1-2 hours.  Do not drive for 24 hours if you were given a medicine to help you relax (sedative). General instructions  Take over-the-counter and prescription medicines only as told by your health care provider.  Keep all follow-up visits as told by your health care provider. This is important. Contact a health care provider if you have:  A fever or chills.  You have redness, swelling, or pain around your insertion site. Get help right away if:  The catheter insertion area swells very fast.  You pass out.  You suddenly start to sweat or your skin gets clammy.  The catheter insertion area is bleeding, and the bleeding does not stop when you hold steady pressure on the area.  The area near or just beyond the catheter insertion site becomes pale, cool, tingly, or numb. These symptoms may represent a serious problem that is an emergency. Do not wait to see if the symptoms will go away. Get medical help right away. Call your local emergency services (911 in the [place] Do not drive yourself to the hospital. Summary  After the procedure, it is common to have bruising that usually fades within 1-2 weeks.  Check your femoral site every day for signs of infection.  Do not lift anything that is heavier than 10 lb (4.5 kg), or the limit that you are told, until your health care provider says that it is safe. This information is not intended to replace advice given to you by your health care provider. Make sure you discuss any questions you have with your health care provider. Document Revised: 05/26/2017 Document Reviewed: 05/26/2017 Elsevier Patient Education  [place]. Moderate Conscious Sedation, Adult, Care After These instructions provide you with information about caring for yourself after your procedure. Your health care provider may also give you more specific instructions. Your treatment has been planned according to current medical practices, but problems sometimes occur. Call your health care provider if you have any problems or questions after your procedure. What can I expect after the procedure? After your procedure, it is common:  To feel sleepy for several hours.  To feel clumsy and have poor balance for several hours.  To have poor judgment for several hours.  To vomit if you eat too soon. Follow these instructions at home: For at least 24 hours after the procedure:   Do not: ? Participate in activities where you could fall or become injured. ?  Drive. ? Use heavy machinery. ? Drink alcohol. ? Take sleeping pills or medicines that cause drowsiness. ? Make important decisions or sign legal documents. ? Take care of children on your own.  Rest. Eating and drinking  Follow the diet recommended by your health care provider.  If you vomit: ? Drink water, juice, or soup when you can drink without vomiting. ? Make sure you have little or no nausea before eating solid foods. General instructions  Have a responsible adult stay with you until you are awake and alert.  Take over-the-counter and prescription medicines only as told by your health care provider.  If you smoke, do not smoke without supervision.  Keep all follow-up visits as told by your health care provider. This is important. Contact a health care provider if:  You keep feeling nauseous or you keep vomiting.  You feel light-headed.  You develop a rash.  You have a fever. Get help right away if:  You have trouble breathing. This information is not intended to replace advice given to you by your health care provider. Make sure you discuss any questions you  have with your health care provider. Document Revised: 04/25/2017 Document Reviewed: 09/02/2015 Elsevier Patient Education  2020 [employer].

## 2020-03-07 NOTE — H&P (Signed)
[facility] Encounter Note  Patient: Michelle Murillo / Admit Date: 03/07/2020 / Date of Encounter: 03/07/2020, 6:01 PM   Subjective: 74 year old female with hypertension hyperlipidemia coronary artery disease status post previous coronary artery bypass graft with progressive anginal symptoms and lateral wall ischemia by stress test here for cardiac catheterization Catheterization showing occluded graft to right coronary artery, occluded distal right coronary artery occluded graft to obtuse marginal patent graft to LAD with moderate atherosclerosis throughout left anterior descending artery and moderate to severe atherosclerosis of the circumflex artery at bend with significant dilation ectasia and tortuosity unable to likely PCI.  Patient did not have LV gram due to kidney dysfunction and the use of dye The patient did have some minor issue with access site for which vascular surgery performed Star closure after some angiogram which showed no evidence of significant abnormality.  Vascular surgery examined the patient 3 hours later and deemed that she was continuing to be without significant concerns. Review of IV fluids is 1 L from the time of admission with between 4 and 500cc urine output Review of Systems: Positive for: None Negative for: Vision change, hearing change, syncope, dizziness, nausea, vomiting,diarrhea, bloody stool, stomach pain, cough, congestion, diaphoresis, urinary frequency, urinary pain,skin lesions, skin rashes Others previously listed  Objective: Telemetry: Normal sinus rhythm Physical Exam: Blood pressure 134/65, pulse (!) 53, temperature 98 F (36.7 C), temperature source Oral, resp. rate 16, height 5\' 5"  (1.651 m), weight 105.2 kg, SpO2 96 %. Body mass index is 38.61 kg/m. General: Well developed, well nourished, in no acute distress. Head: Normocephalic, atraumatic, sclera non-icteric, no xanthomas, nares are without discharge. Neck: No  apparent masses Lungs: Normal respirations with no wheezes, no rhonchi, no rales basilar crackles   Heart: Regular rate and rhythm, normal S1 S2, no murmur, no rub, no gallop, PMI is normal size and placement, carotid upstroke normal without bruit, jugular venous pressure normal Abdomen: Soft, non-tender,  distended with normoactive bowel sounds. No hepatosplenomegaly. Abdominal aorta is normal size without bruit Extremities: No edema, no clubbing, no cyanosis, no ulcers,  Peripheral: 2+ radial, 2+ femoral, 2+ dorsal pedal pulses Neuro: Alert and oriented. Moves all extremities spontaneously. Psych:  Responds to questions appropriately with a normal affect.  No intake or output data in the 24 hours ending 03/07/20 1801  Inpatient Medications:  . [START ON 03/08/2020] aspirin  81 mg Oral Pre-Cath  . sodium chloride flush  3 mL Intravenous Q12H   Infusions:  . sodium chloride    . sodium chloride    . sodium chloride      Labs: No results for input(s): NA, K, CL, CO2, GLUCOSE, BUN, CREATININE, CALCIUM, MG, PHOS in the last 72 hours. No results for input(s): AST, ALT, ALKPHOS, BILITOT, PROT, ALBUMIN in the last 72 hours. No results for input(s): WBC, NEUTROABS, HGB, HCT, MCV, PLT in the last 72 hours. No results for input(s): CKTOTAL, CKMB, TROPONINI in the last 72 hours. Invalid input(s): POCBNP No results for input(s): HGBA1C in the last 72 hours.   Weights: Filed Weights   03/07/20 0955  Weight: 105.2 kg     Radiology/Studies:  CARDIAC CATHETERIZATION  Result Date: 03/07/2020  Dist RCA lesion is 100% stenosed.  Prox RCA to Mid RCA lesion is 75% stenosed.  Prox Cx lesion is 75% stenosed.  Prox LAD to Mid LAD lesion is 50% stenosed.  Mid LAD lesion is 45% stenosed.  Origin lesion is 100% stenosed.  Origin to Prox Graft lesion  is 100% stenosed.  74 year old female with hypertension hyperlipidemia coronary artery disease status post coronary artery bypass graft.  The  patient had severe and large tortuous arteries and had bypass surgery in the remote past.  She has had severe progressive shortness of breath with and without physical activity.  Stress test observes a reversible lateral wall myocardial perfusion defect consistent with myocardial ischemia.  The patient has significant chronic kidney disease and therefore contrast was trying to be saved at this time but did not observe ventriculography at this time.  The patient also had a femoral artery minor complication for which vascular surgery was called.  It was observed that the patient did have minor access problems and multiple views of the femoral artery were performed.  At that time vascular surgery felt that access would be able to be fixed based on StarClose.  Vascular surgery fixed StarClose and recommended continued observation and lying for a slightly longer period of time.  There was no evidence of significant hematoma and or other abnormality at the time requiring further intervention.  Already a catheterization images show severe tortuosity with diffuse three-vessel disease with occlusion of mid to distal right coronary artery and occlusion of graft to that artery.  There was occlusion of graft to obtuse marginal artery with relatively significant stenosis of proximal artery of the circumflex but cannot be intervened upon at this time due to severity of size and tortuosity at lesion.  Will further evaluate and assess with further interventional.  The patient also had tortuosity and moderate atherosclerosis of left anterior descending artery with no evidence of reflux to mammary graft.  Mammary graft did had for flow but with tortuosity of her subclavian there was unable to inject. Assessment Continued severe tortuosity of blood vessels with occlusion of graft to right coronary artery circumflex artery and patent graft to left anterior descending artery with continued concerns for anginal symptoms Plan Maximize  medication management for anginal symptoms and three-vessel coronary artery disease with previous coronary bypass graft which have failed and continued moderate atherosclerosis not easily amenable to PCI and stent placement due to severe size tortuosity at this time.  Further treatment options after maximizing medication management     Assessment and Recommendation  74 y.o. female known cardiovascular disease hypertension hyperlipidemia with coronary atherosclerosis needing medical management and minor vascular and/or femoral closure device used to with vascular surgery's assistance and no evidence of hematoma bleeding or other peripheral vascular concerns with good peripheral pulses as seen and also deemed by vascular surgery 1.  Okay for discharged home at this time with medical management of three-vessel coronary artery disease unstable angina but no evidence of critical disease today 2.  Patient is to continue to watch for shortness of breath PND orthopnea over the next 24 to 48 hours 3.  Patient has appointment for 48 hours to assess kidney dysfunction and/or side effects of dye with assessment of all above is well.  Patient will call if any concerns prior to this issue  Signed, Serafina Royals M.D. FACC

## 2020-03-07 NOTE — Op Note (Signed)
[facility]  Percutaneous Study/Intervention Procedural Note   Date of Surgery: 03/07/2020,4:11 PM  Surgeon:Lisabeth Mian, Dolores Lory   Pre-operative Diagnosis: Possible groin complication from cardiac catheterization access  Post-operative diagnosis:  Same  Procedure(s) Performed:  1.  Right common femoral angiography multiple views via the 5 French right groin sheath  2.  Star close right SFA   Anesthesia: Conscious sedation was administered by Dr. Nehemiah Massed  Sheath: 5 French sheath retrograde right superficial femoral  Contrast: 30 cc for my portion of the procedure  Fluoroscopy Time: 59 minutes for both the cardiac catheterization as well as the evaluation of the right groin  Indications: I am asked to evaluate the access site emergently.  After the cardiac catheterization portion had been completed they were unable to aspirate from the sheath and were concerned for on arterial injury.  Procedure:  Michelle Robbs Warrenis a 74 y.o. female who was identified and appropriate procedural time out was performed.  The patient was then placed supine on the table and prepped and draped in the usual sterile fashion.  Patient is on the table in the cardiac catheterization lab.  Access is already been obtained and she is already undergone cardiac catheterization.  Attempts at aspirating the sheath were not successful and therefore I introduced the J-wire under fluoroscopy.  Once obtaining enough wire purchase to remove the sheath.  Sheath was irrigated and found to be filled with thrombus.  Once the sheath of been cleared dilator was replaced and the sheath was reinserted.  I then performed multiple different obliquities including several RAO as well as an LAO to determine the access site.  Access site appeared to be in the proximal SFA and given this finding I was able to use a Star close to conclude the case.  StarClose was deployed in the usual fashion.  Findings:   Right  Lower Extremity: Imaging demonstrates the external iliac artery as well as the common femoral artery is widely patent.  There is no injury.  There is no dissection there is no significant atherosclerotic changes.  The femoral bifurcation is identified.  Once several different obliquities have been attained it does appear that the access point is in the superficial femoral artery and not in the profunda femoris.  Therefore StarClose was utilized.   Disposition: Patient was taken to the recovery room in stable condition having tolerated the procedure well.  Michelle Murillo 03/07/2020,4:11 PM

## 2020-03-08 ENCOUNTER — Encounter: Payer: Self-pay | Admitting: Internal Medicine

## 2020-03-09 DIAGNOSIS — E7800 Pure hypercholesterolemia, unspecified: Secondary | ICD-10-CM | POA: Diagnosis not present

## 2020-03-09 DIAGNOSIS — I25708 Atherosclerosis of coronary artery bypass graft(s), unspecified, with other forms of angina pectoris: Secondary | ICD-10-CM | POA: Diagnosis not present

## 2020-03-09 DIAGNOSIS — I480 Paroxysmal atrial fibrillation: Secondary | ICD-10-CM | POA: Diagnosis not present

## 2020-03-09 DIAGNOSIS — I714 Abdominal aortic aneurysm, without rupture: Secondary | ICD-10-CM | POA: Diagnosis not present

## 2020-03-09 DIAGNOSIS — N1832 Chronic kidney disease, stage 3b: Secondary | ICD-10-CM | POA: Diagnosis not present

## 2020-03-09 DIAGNOSIS — I10 Essential (primary) hypertension: Secondary | ICD-10-CM | POA: Diagnosis not present

## 2020-04-06 DIAGNOSIS — I10 Essential (primary) hypertension: Secondary | ICD-10-CM | POA: Diagnosis not present

## 2020-04-06 DIAGNOSIS — I209 Angina pectoris, unspecified: Secondary | ICD-10-CM | POA: Diagnosis not present

## 2020-04-06 DIAGNOSIS — I25810 Atherosclerosis of coronary artery bypass graft(s) without angina pectoris: Secondary | ICD-10-CM | POA: Diagnosis not present

## 2020-04-06 DIAGNOSIS — E7800 Pure hypercholesterolemia, unspecified: Secondary | ICD-10-CM | POA: Diagnosis not present

## 2020-04-26 DIAGNOSIS — N2581 Secondary hyperparathyroidism of renal origin: Secondary | ICD-10-CM | POA: Diagnosis not present

## 2020-04-26 DIAGNOSIS — E875 Hyperkalemia: Secondary | ICD-10-CM | POA: Diagnosis not present

## 2020-04-26 DIAGNOSIS — E1122 Type 2 diabetes mellitus with diabetic chronic kidney disease: Secondary | ICD-10-CM | POA: Diagnosis not present

## 2020-04-26 DIAGNOSIS — N184 Chronic kidney disease, stage 4 (severe): Secondary | ICD-10-CM | POA: Diagnosis not present

## 2020-04-26 DIAGNOSIS — R809 Proteinuria, unspecified: Secondary | ICD-10-CM | POA: Diagnosis not present

## 2020-04-26 DIAGNOSIS — D631 Anemia in chronic kidney disease: Secondary | ICD-10-CM | POA: Diagnosis not present

## 2020-04-26 DIAGNOSIS — I129 Hypertensive chronic kidney disease with stage 1 through stage 4 chronic kidney disease, or unspecified chronic kidney disease: Secondary | ICD-10-CM | POA: Diagnosis not present

## 2020-06-14 DIAGNOSIS — R5383 Other fatigue: Secondary | ICD-10-CM | POA: Diagnosis not present

## 2020-06-14 DIAGNOSIS — I25708 Atherosclerosis of coronary artery bypass graft(s), unspecified, with other forms of angina pectoris: Secondary | ICD-10-CM | POA: Diagnosis not present

## 2020-06-14 DIAGNOSIS — R6889 Other general symptoms and signs: Secondary | ICD-10-CM | POA: Diagnosis not present

## 2020-06-14 DIAGNOSIS — I209 Angina pectoris, unspecified: Secondary | ICD-10-CM | POA: Diagnosis not present

## 2020-06-14 DIAGNOSIS — I25810 Atherosclerosis of coronary artery bypass graft(s) without angina pectoris: Secondary | ICD-10-CM | POA: Diagnosis not present

## 2020-06-14 DIAGNOSIS — Z951 Presence of aortocoronary bypass graft: Secondary | ICD-10-CM | POA: Diagnosis not present

## 2020-06-14 DIAGNOSIS — Z720 Tobacco use: Secondary | ICD-10-CM | POA: Diagnosis not present

## 2020-06-14 DIAGNOSIS — N184 Chronic kidney disease, stage 4 (severe): Secondary | ICD-10-CM | POA: Diagnosis not present

## 2020-06-14 DIAGNOSIS — Z6837 Body mass index (BMI) 37.0-37.9, adult: Secondary | ICD-10-CM | POA: Diagnosis not present

## 2020-06-14 DIAGNOSIS — E7800 Pure hypercholesterolemia, unspecified: Secondary | ICD-10-CM | POA: Diagnosis not present

## 2020-06-14 DIAGNOSIS — I10 Essential (primary) hypertension: Secondary | ICD-10-CM | POA: Diagnosis not present

## 2020-06-14 DIAGNOSIS — I714 Abdominal aortic aneurysm, without rupture: Secondary | ICD-10-CM | POA: Diagnosis not present

## 2020-06-14 DIAGNOSIS — R5381 Other malaise: Secondary | ICD-10-CM | POA: Diagnosis not present

## 2020-06-14 DIAGNOSIS — R079 Chest pain, unspecified: Secondary | ICD-10-CM | POA: Diagnosis not present

## 2020-06-14 DIAGNOSIS — E1121 Type 2 diabetes mellitus with diabetic nephropathy: Secondary | ICD-10-CM | POA: Diagnosis not present

## 2020-06-19 DIAGNOSIS — Z951 Presence of aortocoronary bypass graft: Secondary | ICD-10-CM | POA: Diagnosis not present

## 2020-06-19 DIAGNOSIS — J449 Chronic obstructive pulmonary disease, unspecified: Secondary | ICD-10-CM | POA: Diagnosis not present

## 2020-06-19 DIAGNOSIS — R2689 Other abnormalities of gait and mobility: Secondary | ICD-10-CM | POA: Diagnosis not present

## 2020-06-19 DIAGNOSIS — H548 Legal blindness, as defined in USA: Secondary | ICD-10-CM | POA: Diagnosis not present

## 2020-06-19 DIAGNOSIS — Z79899 Other long term (current) drug therapy: Secondary | ICD-10-CM | POA: Diagnosis not present

## 2020-06-19 DIAGNOSIS — I129 Hypertensive chronic kidney disease with stage 1 through stage 4 chronic kidney disease, or unspecified chronic kidney disease: Secondary | ICD-10-CM | POA: Diagnosis not present

## 2020-06-19 DIAGNOSIS — N184 Chronic kidney disease, stage 4 (severe): Secondary | ICD-10-CM | POA: Diagnosis not present

## 2020-06-26 IMAGING — CR DG CHEST 2V
2 series · 2 of 2 positions shown · non-contrast
Comparison: CT 08/25/2017

CLINICAL DATA: Cough, congestion

EXAM:
CHEST - 2 VIEW

[chest pa]
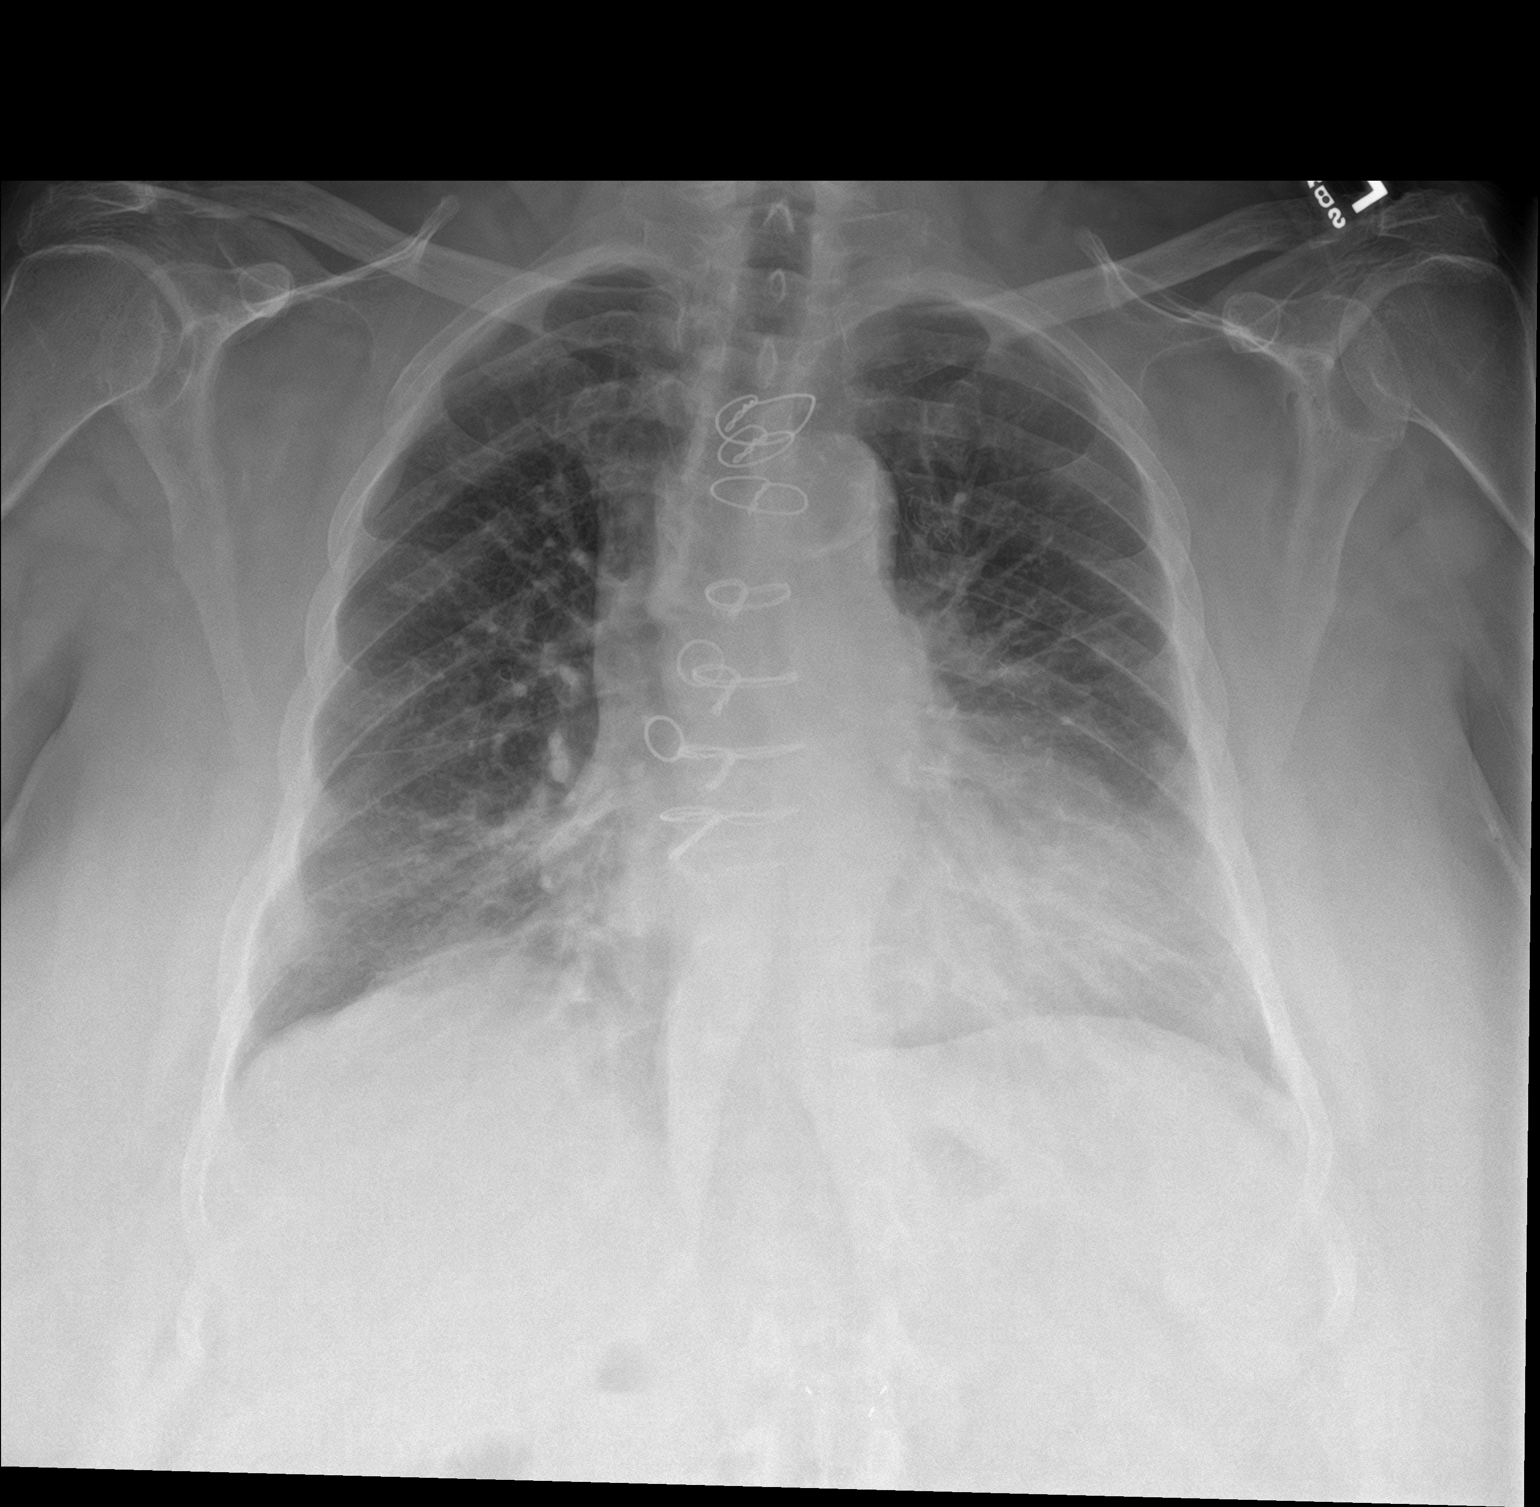

[chest lat]
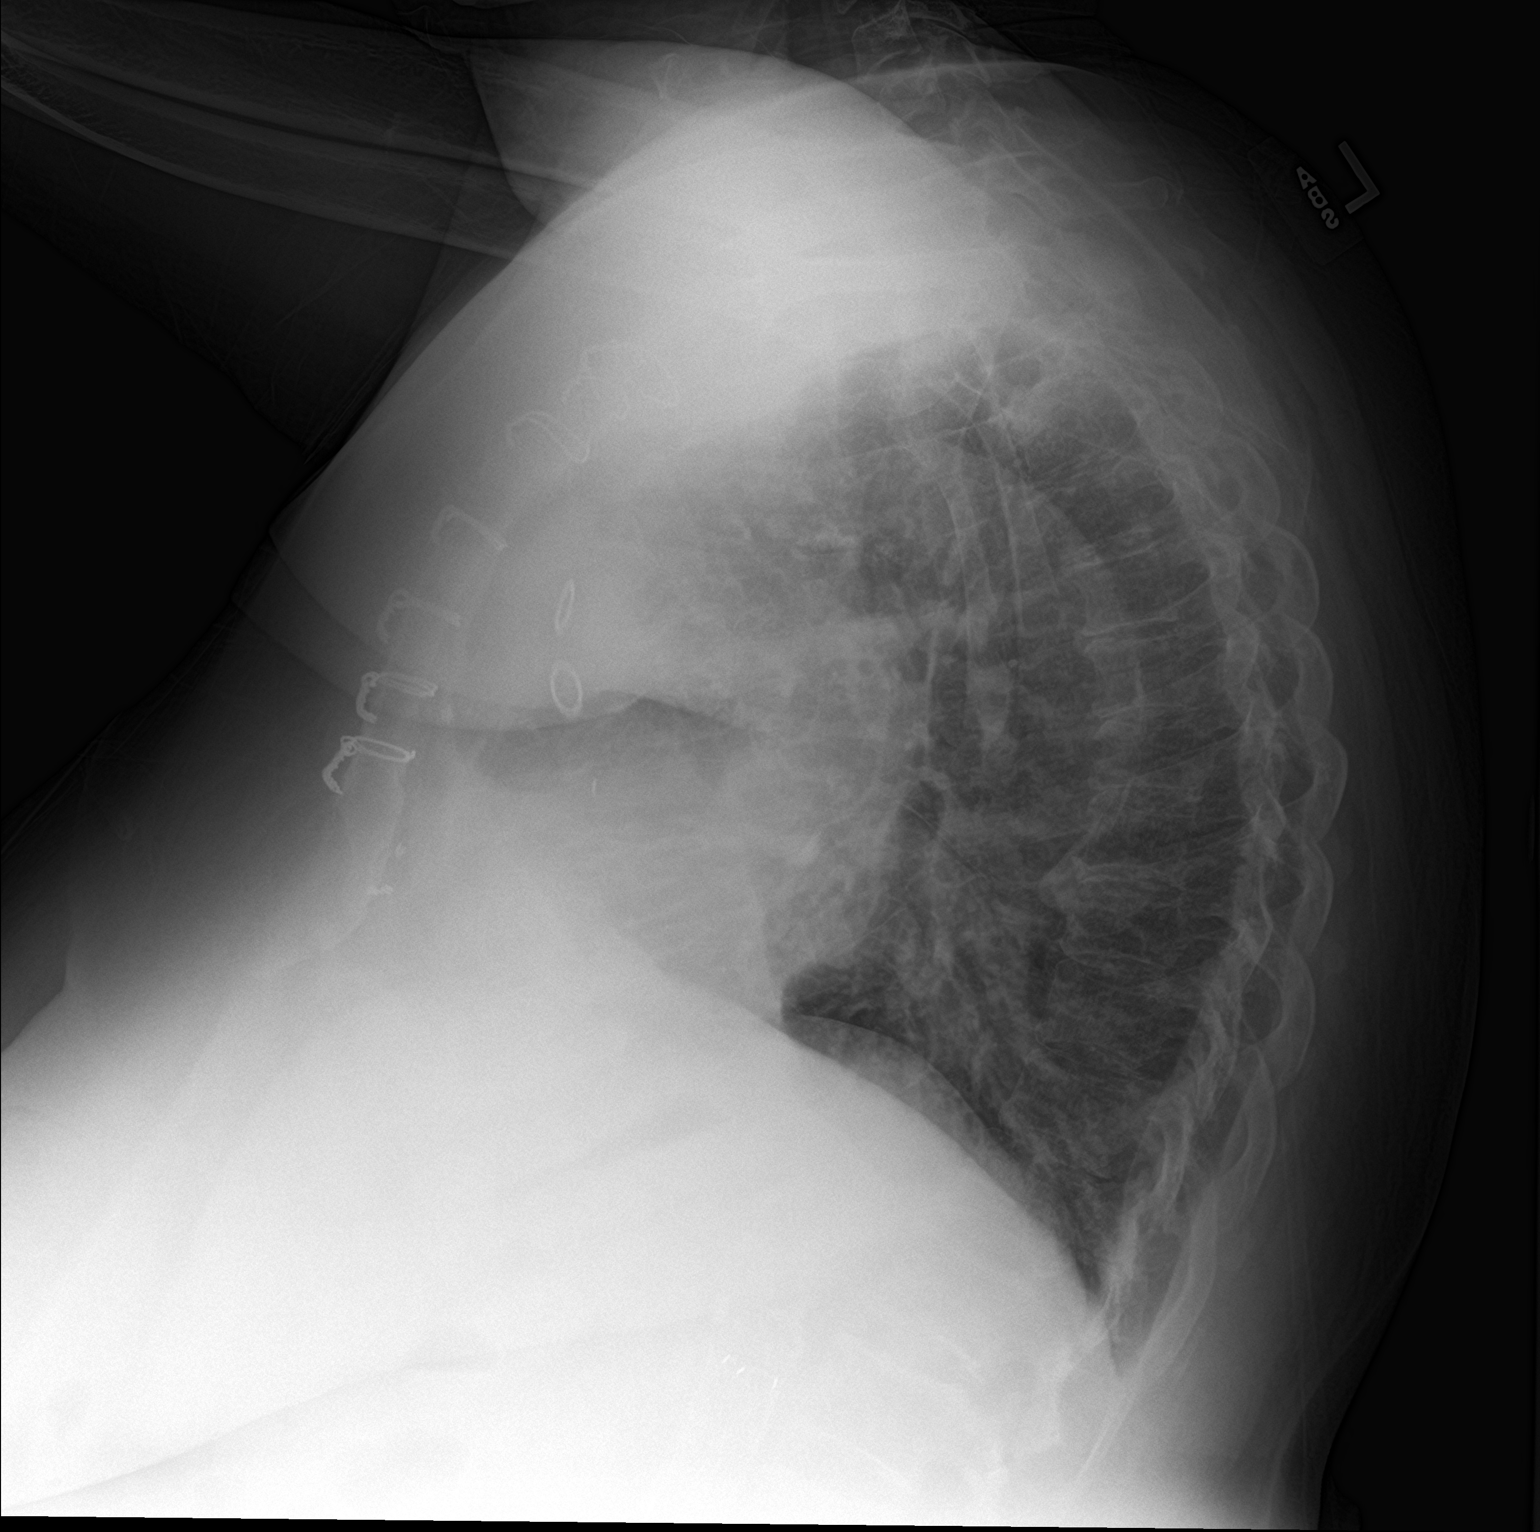

[2 of 2 positions shown; findings below may reference images not displayed]

FINDINGS: Prior CABG. Heart is normal size. No confluent opacities or
effusions. No overt edema. No acute bony abnormality.
IMPRESSION: Cardiomegaly.  No active disease.

## 2020-08-07 DIAGNOSIS — I129 Hypertensive chronic kidney disease with stage 1 through stage 4 chronic kidney disease, or unspecified chronic kidney disease: Secondary | ICD-10-CM | POA: Diagnosis not present

## 2020-08-07 DIAGNOSIS — R809 Proteinuria, unspecified: Secondary | ICD-10-CM | POA: Diagnosis not present

## 2020-08-07 DIAGNOSIS — R319 Hematuria, unspecified: Secondary | ICD-10-CM | POA: Diagnosis not present

## 2020-08-07 DIAGNOSIS — N184 Chronic kidney disease, stage 4 (severe): Secondary | ICD-10-CM | POA: Diagnosis not present

## 2020-08-07 DIAGNOSIS — N2581 Secondary hyperparathyroidism of renal origin: Secondary | ICD-10-CM | POA: Diagnosis not present

## 2020-08-07 DIAGNOSIS — E1122 Type 2 diabetes mellitus with diabetic chronic kidney disease: Secondary | ICD-10-CM | POA: Diagnosis not present

## 2020-08-07 DIAGNOSIS — D631 Anemia in chronic kidney disease: Secondary | ICD-10-CM | POA: Diagnosis not present

## 2020-08-07 DIAGNOSIS — E875 Hyperkalemia: Secondary | ICD-10-CM | POA: Diagnosis not present

## 2020-09-28 DIAGNOSIS — I714 Abdominal aortic aneurysm, without rupture: Secondary | ICD-10-CM | POA: Diagnosis not present

## 2020-09-28 DIAGNOSIS — I480 Paroxysmal atrial fibrillation: Secondary | ICD-10-CM | POA: Diagnosis not present

## 2020-09-28 DIAGNOSIS — I25810 Atherosclerosis of coronary artery bypass graft(s) without angina pectoris: Secondary | ICD-10-CM | POA: Diagnosis not present

## 2020-09-28 DIAGNOSIS — I25708 Atherosclerosis of coronary artery bypass graft(s), unspecified, with other forms of angina pectoris: Secondary | ICD-10-CM | POA: Diagnosis not present

## 2020-09-28 DIAGNOSIS — I209 Angina pectoris, unspecified: Secondary | ICD-10-CM | POA: Diagnosis not present

## 2020-09-28 DIAGNOSIS — E7800 Pure hypercholesterolemia, unspecified: Secondary | ICD-10-CM | POA: Diagnosis not present

## 2020-09-28 DIAGNOSIS — I10 Essential (primary) hypertension: Secondary | ICD-10-CM | POA: Diagnosis not present

## 2020-10-02 DIAGNOSIS — R2689 Other abnormalities of gait and mobility: Secondary | ICD-10-CM | POA: Diagnosis not present

## 2020-10-02 DIAGNOSIS — I209 Angina pectoris, unspecified: Secondary | ICD-10-CM | POA: Diagnosis not present

## 2020-10-02 DIAGNOSIS — R8290 Unspecified abnormal findings in urine: Secondary | ICD-10-CM | POA: Diagnosis not present

## 2020-10-02 DIAGNOSIS — Z720 Tobacco use: Secondary | ICD-10-CM | POA: Diagnosis not present

## 2020-10-02 DIAGNOSIS — I25708 Atherosclerosis of coronary artery bypass graft(s), unspecified, with other forms of angina pectoris: Secondary | ICD-10-CM | POA: Diagnosis not present

## 2020-10-02 DIAGNOSIS — J432 Centrilobular emphysema: Secondary | ICD-10-CM | POA: Diagnosis not present

## 2020-10-02 DIAGNOSIS — I25810 Atherosclerosis of coronary artery bypass graft(s) without angina pectoris: Secondary | ICD-10-CM | POA: Diagnosis not present

## 2020-10-02 DIAGNOSIS — Z951 Presence of aortocoronary bypass graft: Secondary | ICD-10-CM | POA: Diagnosis not present

## 2020-10-02 DIAGNOSIS — I10 Essential (primary) hypertension: Secondary | ICD-10-CM | POA: Diagnosis not present

## 2020-10-02 DIAGNOSIS — D649 Anemia, unspecified: Secondary | ICD-10-CM | POA: Diagnosis not present

## 2020-10-02 DIAGNOSIS — E119 Type 2 diabetes mellitus without complications: Secondary | ICD-10-CM | POA: Diagnosis not present

## 2020-10-09 DIAGNOSIS — J449 Chronic obstructive pulmonary disease, unspecified: Secondary | ICD-10-CM | POA: Diagnosis not present

## 2020-10-09 DIAGNOSIS — I129 Hypertensive chronic kidney disease with stage 1 through stage 4 chronic kidney disease, or unspecified chronic kidney disease: Secondary | ICD-10-CM | POA: Diagnosis not present

## 2020-10-09 DIAGNOSIS — I2510 Atherosclerotic heart disease of native coronary artery without angina pectoris: Secondary | ICD-10-CM | POA: Diagnosis not present

## 2020-10-09 DIAGNOSIS — E1122 Type 2 diabetes mellitus with diabetic chronic kidney disease: Secondary | ICD-10-CM | POA: Diagnosis not present

## 2020-10-09 DIAGNOSIS — Z720 Tobacco use: Secondary | ICD-10-CM | POA: Diagnosis not present

## 2020-10-09 DIAGNOSIS — Z0000 Encounter for general adult medical examination without abnormal findings: Secondary | ICD-10-CM | POA: Diagnosis not present

## 2020-10-09 DIAGNOSIS — N184 Chronic kidney disease, stage 4 (severe): Secondary | ICD-10-CM | POA: Diagnosis not present

## 2020-10-09 DIAGNOSIS — Z79899 Other long term (current) drug therapy: Secondary | ICD-10-CM | POA: Diagnosis not present

## 2020-10-09 DIAGNOSIS — I714 Abdominal aortic aneurysm, without rupture: Secondary | ICD-10-CM | POA: Diagnosis not present

## 2020-11-08 DIAGNOSIS — E875 Hyperkalemia: Secondary | ICD-10-CM | POA: Diagnosis not present

## 2020-11-08 DIAGNOSIS — R809 Proteinuria, unspecified: Secondary | ICD-10-CM | POA: Diagnosis not present

## 2020-11-08 DIAGNOSIS — E1122 Type 2 diabetes mellitus with diabetic chronic kidney disease: Secondary | ICD-10-CM | POA: Diagnosis not present

## 2020-11-08 DIAGNOSIS — D631 Anemia in chronic kidney disease: Secondary | ICD-10-CM | POA: Diagnosis not present

## 2020-11-08 DIAGNOSIS — N2581 Secondary hyperparathyroidism of renal origin: Secondary | ICD-10-CM | POA: Diagnosis not present

## 2020-11-08 DIAGNOSIS — N184 Chronic kidney disease, stage 4 (severe): Secondary | ICD-10-CM | POA: Diagnosis not present

## 2020-11-08 DIAGNOSIS — I129 Hypertensive chronic kidney disease with stage 1 through stage 4 chronic kidney disease, or unspecified chronic kidney disease: Secondary | ICD-10-CM | POA: Diagnosis not present

## 2020-12-26 DIAGNOSIS — E1121 Type 2 diabetes mellitus with diabetic nephropathy: Secondary | ICD-10-CM | POA: Diagnosis not present

## 2020-12-26 DIAGNOSIS — I25708 Atherosclerosis of coronary artery bypass graft(s), unspecified, with other forms of angina pectoris: Secondary | ICD-10-CM | POA: Diagnosis not present

## 2020-12-26 DIAGNOSIS — R079 Chest pain, unspecified: Secondary | ICD-10-CM | POA: Diagnosis not present

## 2020-12-26 DIAGNOSIS — N1832 Chronic kidney disease, stage 3b: Secondary | ICD-10-CM | POA: Diagnosis not present

## 2020-12-26 DIAGNOSIS — E7800 Pure hypercholesterolemia, unspecified: Secondary | ICD-10-CM | POA: Diagnosis not present

## 2020-12-26 DIAGNOSIS — I10 Essential (primary) hypertension: Secondary | ICD-10-CM | POA: Diagnosis not present

## 2020-12-26 DIAGNOSIS — I209 Angina pectoris, unspecified: Secondary | ICD-10-CM | POA: Diagnosis not present

## 2020-12-26 DIAGNOSIS — I471 Supraventricular tachycardia: Secondary | ICD-10-CM | POA: Diagnosis not present

## 2020-12-26 DIAGNOSIS — I714 Abdominal aortic aneurysm, without rupture: Secondary | ICD-10-CM | POA: Diagnosis not present

## 2021-01-23 DIAGNOSIS — I25708 Atherosclerosis of coronary artery bypass graft(s), unspecified, with other forms of angina pectoris: Secondary | ICD-10-CM | POA: Diagnosis not present

## 2021-01-23 DIAGNOSIS — E7800 Pure hypercholesterolemia, unspecified: Secondary | ICD-10-CM | POA: Diagnosis not present

## 2021-01-23 DIAGNOSIS — E119 Type 2 diabetes mellitus without complications: Secondary | ICD-10-CM | POA: Diagnosis not present

## 2021-01-23 DIAGNOSIS — Z794 Long term (current) use of insulin: Secondary | ICD-10-CM | POA: Diagnosis not present

## 2021-01-23 DIAGNOSIS — I10 Essential (primary) hypertension: Secondary | ICD-10-CM | POA: Diagnosis not present

## 2021-02-05 DIAGNOSIS — Z0000 Encounter for general adult medical examination without abnormal findings: Secondary | ICD-10-CM | POA: Diagnosis not present

## 2021-02-05 DIAGNOSIS — Z951 Presence of aortocoronary bypass graft: Secondary | ICD-10-CM | POA: Diagnosis not present

## 2021-02-05 DIAGNOSIS — I25708 Atherosclerosis of coronary artery bypass graft(s), unspecified, with other forms of angina pectoris: Secondary | ICD-10-CM | POA: Diagnosis not present

## 2021-02-05 DIAGNOSIS — E7800 Pure hypercholesterolemia, unspecified: Secondary | ICD-10-CM | POA: Diagnosis not present

## 2021-02-05 DIAGNOSIS — E119 Type 2 diabetes mellitus without complications: Secondary | ICD-10-CM | POA: Diagnosis not present

## 2021-02-05 DIAGNOSIS — N184 Chronic kidney disease, stage 4 (severe): Secondary | ICD-10-CM | POA: Diagnosis not present

## 2021-02-05 DIAGNOSIS — N2581 Secondary hyperparathyroidism of renal origin: Secondary | ICD-10-CM | POA: Diagnosis not present

## 2021-02-05 DIAGNOSIS — Z720 Tobacco use: Secondary | ICD-10-CM | POA: Diagnosis not present

## 2021-02-05 DIAGNOSIS — D631 Anemia in chronic kidney disease: Secondary | ICD-10-CM | POA: Diagnosis not present

## 2021-02-05 DIAGNOSIS — I129 Hypertensive chronic kidney disease with stage 1 through stage 4 chronic kidney disease, or unspecified chronic kidney disease: Secondary | ICD-10-CM | POA: Diagnosis not present

## 2021-02-09 DIAGNOSIS — J449 Chronic obstructive pulmonary disease, unspecified: Secondary | ICD-10-CM | POA: Diagnosis not present

## 2021-02-09 DIAGNOSIS — N184 Chronic kidney disease, stage 4 (severe): Secondary | ICD-10-CM | POA: Diagnosis not present

## 2021-02-09 DIAGNOSIS — E1122 Type 2 diabetes mellitus with diabetic chronic kidney disease: Secondary | ICD-10-CM | POA: Diagnosis not present

## 2021-02-09 DIAGNOSIS — Z720 Tobacco use: Secondary | ICD-10-CM | POA: Diagnosis not present

## 2021-02-09 DIAGNOSIS — I129 Hypertensive chronic kidney disease with stage 1 through stage 4 chronic kidney disease, or unspecified chronic kidney disease: Secondary | ICD-10-CM | POA: Diagnosis not present

## 2021-02-09 DIAGNOSIS — J209 Acute bronchitis, unspecified: Secondary | ICD-10-CM | POA: Diagnosis not present

## 2021-04-05 DIAGNOSIS — H524 Presbyopia: Secondary | ICD-10-CM | POA: Diagnosis not present

## 2021-04-05 DIAGNOSIS — H353131 Nonexudative age-related macular degeneration, bilateral, early dry stage: Secondary | ICD-10-CM | POA: Diagnosis not present

## 2021-05-10 DIAGNOSIS — I25708 Atherosclerosis of coronary artery bypass graft(s), unspecified, with other forms of angina pectoris: Secondary | ICD-10-CM | POA: Diagnosis not present

## 2021-05-10 DIAGNOSIS — Z951 Presence of aortocoronary bypass graft: Secondary | ICD-10-CM | POA: Diagnosis not present

## 2021-06-07 DIAGNOSIS — H18413 Arcus senilis, bilateral: Secondary | ICD-10-CM | POA: Diagnosis not present

## 2021-06-07 DIAGNOSIS — H25013 Cortical age-related cataract, bilateral: Secondary | ICD-10-CM | POA: Diagnosis not present

## 2021-06-07 DIAGNOSIS — H2511 Age-related nuclear cataract, right eye: Secondary | ICD-10-CM | POA: Diagnosis not present

## 2021-06-07 DIAGNOSIS — H2513 Age-related nuclear cataract, bilateral: Secondary | ICD-10-CM | POA: Diagnosis not present

## 2021-06-07 DIAGNOSIS — H353131 Nonexudative age-related macular degeneration, bilateral, early dry stage: Secondary | ICD-10-CM | POA: Diagnosis not present

## 2021-07-02 DIAGNOSIS — N184 Chronic kidney disease, stage 4 (severe): Secondary | ICD-10-CM | POA: Diagnosis not present

## 2021-07-02 DIAGNOSIS — M25551 Pain in right hip: Secondary | ICD-10-CM | POA: Diagnosis not present

## 2021-07-02 DIAGNOSIS — I129 Hypertensive chronic kidney disease with stage 1 through stage 4 chronic kidney disease, or unspecified chronic kidney disease: Secondary | ICD-10-CM | POA: Diagnosis not present

## 2021-07-02 DIAGNOSIS — E1122 Type 2 diabetes mellitus with diabetic chronic kidney disease: Secondary | ICD-10-CM | POA: Diagnosis not present

## 2021-07-02 DIAGNOSIS — Z951 Presence of aortocoronary bypass graft: Secondary | ICD-10-CM | POA: Diagnosis not present

## 2021-07-02 DIAGNOSIS — E669 Obesity, unspecified: Secondary | ICD-10-CM | POA: Diagnosis not present

## 2021-07-02 DIAGNOSIS — I25810 Atherosclerosis of coronary artery bypass graft(s) without angina pectoris: Secondary | ICD-10-CM | POA: Diagnosis not present

## 2021-07-02 DIAGNOSIS — J449 Chronic obstructive pulmonary disease, unspecified: Secondary | ICD-10-CM | POA: Diagnosis not present

## 2021-07-02 DIAGNOSIS — M7061 Trochanteric bursitis, right hip: Secondary | ICD-10-CM | POA: Diagnosis not present

## 2021-07-11 DIAGNOSIS — N184 Chronic kidney disease, stage 4 (severe): Secondary | ICD-10-CM | POA: Diagnosis not present

## 2021-07-11 DIAGNOSIS — N2581 Secondary hyperparathyroidism of renal origin: Secondary | ICD-10-CM | POA: Diagnosis not present

## 2021-07-11 DIAGNOSIS — E1122 Type 2 diabetes mellitus with diabetic chronic kidney disease: Secondary | ICD-10-CM | POA: Diagnosis not present

## 2021-07-11 DIAGNOSIS — E875 Hyperkalemia: Secondary | ICD-10-CM | POA: Diagnosis not present

## 2021-07-11 DIAGNOSIS — D631 Anemia in chronic kidney disease: Secondary | ICD-10-CM | POA: Diagnosis not present

## 2021-07-11 DIAGNOSIS — R809 Proteinuria, unspecified: Secondary | ICD-10-CM | POA: Diagnosis not present

## 2021-07-11 DIAGNOSIS — R319 Hematuria, unspecified: Secondary | ICD-10-CM | POA: Diagnosis not present

## 2021-07-11 DIAGNOSIS — I129 Hypertensive chronic kidney disease with stage 1 through stage 4 chronic kidney disease, or unspecified chronic kidney disease: Secondary | ICD-10-CM | POA: Diagnosis not present

## 2021-07-18 DIAGNOSIS — M545 Low back pain, unspecified: Secondary | ICD-10-CM | POA: Diagnosis not present

## 2021-07-18 DIAGNOSIS — M25551 Pain in right hip: Secondary | ICD-10-CM | POA: Diagnosis not present

## 2021-08-20 ENCOUNTER — Other Ambulatory Visit: Payer: Self-pay

## 2021-08-20 ENCOUNTER — Encounter: Payer: Self-pay | Admitting: Urology

## 2021-08-20 ENCOUNTER — Ambulatory Visit (INDEPENDENT_AMBULATORY_CARE_PROVIDER_SITE_OTHER): Payer: Medicare HMO | Admitting: Urology

## 2021-08-20 VITALS — BP 126/76 | HR 71 | Ht 65.0 in | Wt 217.0 lb

## 2021-08-20 DIAGNOSIS — N3946 Mixed incontinence: Secondary | ICD-10-CM

## 2021-08-20 DIAGNOSIS — R319 Hematuria, unspecified: Secondary | ICD-10-CM

## 2021-08-20 MED ORDER — MIRABEGRON ER 50 MG PO TB24: 50.0000 mg | ORAL_TABLET | Freq: Every day | ORAL | 11 refills | Status: DC

## 2021-08-20 MED ORDER — CEPHALEXIN 250 MG PO CAPS: 250.0000 mg | ORAL_CAPSULE | Freq: Three times a day (TID) | ORAL | 0 refills | Status: DC

## 2021-08-20 NOTE — Addendum Note (Signed)
Addended by: Verlene Mayer A on: 08/20/2021 10:24 AM ? ? Modules accepted: Orders ? ?

## 2021-08-20 NOTE — Progress Notes (Signed)
? ?08/20/2021 ?9:59 AM  ? ?Annabelle Harman ?09-17-1945 ?[id number] ? ?Referring provider: Lavonia Dana, MD ?[address] ?[address] ?[place],  [place] [address] ? ?Chief Complaint  ?Patient presents with  ? Hematuria  ? ? ?HPI: ?I was consulted to assist the patient's urinary continence.  She has urge incontinence especially when she goes from a sitting to standing position.  She leaks with coughing sneezing and both of these significant.  I think she has foot on the floor syndrome but no bedwetting. ? ?She voids 3-4 times a night.  She says she voids every time she gets out of a chair and cannot hold it for 2 hours.  Flow was good ? ?No hysterectomy ? ?No history of bladder surgery kidney stones or bladder infections.  No neurologic issues.  Bowel movements normal.  No treatment ? ? ?PMH: ?Past Medical History:  ?Diagnosis Date  ? Abdominal aneurysm   ? Cancer [facility])   ? 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 CHF (congestive heart failure) ([place])   ? Chronic kidney disease 2019  ? stage IV chronic kidney disease. now functions at 35%  ? Complication of anesthesia 2013  ? "stops breathing and goes into convulsions".  pt was in a coma for 18 days after this.(during CABG)  ? Complication of anesthesia   ? dr. Doris Cheadle is attending provider. surgery was at [facility]  ? COPD (chronic obstructive pulmonary disease) ([place])   ? Coronary artery disease   ? Diabetes mellitus without complication ([place])   ? borderline, does not take any medication  ? GERD (gastroesophageal reflux disease)   ? Gout   ? Hypertension   ? Legally blind   ? unable to see any more than shapes  ? Seizures ([place]) 2013  ? only happened during cabg procedure while on the table  ? ? ?Surgical History: ?Past Surgical History:  ?Procedure Laterality Date  ? ABDOMINAL AORTOGRAM N/A 06/23/2017  ? Procedure: ABDOMINAL AORTOGRAM;  Surgeon: Algernon Huxley, MD;  Location: [facility];  Service: Cardiovascular;  Laterality: N/A;  ?  ABDOMINAL SURGERY  2017  ? aneurysm repair with dr. dew  ? BRONCHOSCOPY  2015  ? done at [facility] carcinoid of airways  ? BYPASS GRAFT  2013  ? cabg x 4  ? CARDIAC SURGERY  2013  ? cabg  ? CHOLECYSTECTOMY    ? age 31  ? CORONARY ARTERY BYPASS GRAFT    ? ENDOVASCULAR REPAIR/STENT GRAFT N/A 07/06/2018  ? Procedure: ENDOVASCULAR REPAIR/STENT GRAFT;  Surgeon: Algernon Huxley, MD;  Location: [facility];  Service: Cardiovascular;  Laterality: N/A;  ? laryngeal nodule surgery    ? LEFT HEART CATH AND CORS/GRAFTS ANGIOGRAPHY Left 03/07/2020  ? Procedure: LEFT HEART CATH AND CORS/GRAFTS ANGIOGRAPHY;  Surgeon: Corey Skains, MD;  Location: [facility];  Service: Cardiovascular;  Laterality: Left;  ? ? ?Home Medications:  ?Allergies as of 08/20/2021   ? ?   Reactions  ? Isosorbide Nitrate Nausea And Vomiting  ? Tylenol [acetaminophen] Other (See Comments)  ? Hallucinations "sees things"  ? Bupropion Nausea Only  ? Other reaction(s): Other (See Comments) ?"I didn't do well with it.".hallucinates  ? Morphine And Related Itching  ? ?  ? ?  ?Medication List  ?  ? ?  ? Accurate as of August 20, 2021  9:59 AM. If you have any questions, ask your nurse or doctor.  ?  ?  ? ?  ? ?  allopurinol 300 MG tablet ?Commonly known as: ZYLOPRIM ?Take 300 mg by mouth daily. ?  ?aspirin EC 81 MG tablet ?Take 81 mg by mouth daily. ?  ?calcitRIOL 0.25 MCG capsule ?Commonly known as: ROCALTROL ?Take 0.25 mcg by mouth daily. ?  ?furosemide 40 MG tablet ?Commonly known as: LASIX ?Take 40 mg by mouth daily as needed (for fluid retention (swollen ankles)---typically no more than twice a week). ?  ?isosorbide mononitrate 30 MG 24 hr tablet ?Commonly known as: IMDUR ?Take 30 mg by mouth daily. ?  ?losartan 100 MG tablet ?Commonly known as: COZAAR ?Take 100 mg by mouth daily. ?  ?metoprolol tartrate 25 MG tablet ?Commonly known as: LOPRESSOR ?Take 50 mg by mouth 2 (two) times daily. ?  ?omeprazole 40 MG capsule ?Commonly known as:  PRILOSEC ?Take 40 mg by mouth daily as needed (for acid reflux/indigestion.). ?  ? ?  ? ? ?Allergies:  ?Allergies  ?Allergen Reactions  ? Isosorbide Nitrate Nausea And Vomiting  ? Tylenol [Acetaminophen] Other (See Comments)  ?  Hallucinations "sees things"  ? Bupropion Nausea Only  ?  Other reaction(s): Other (See Comments) ?"I didn't do well with it.".hallucinates  ? Morphine And Related Itching  ? ? ?Family History: ?Family History  ?Problem Relation Age of Onset  ? Congestive Heart Failure Mother   ? Heart attack Mother   ? Cancer Father   ? Liver cancer Father   ? Cancer Paternal Grandfather   ? ? ?Social History:  reports that she has been smoking cigarettes. She has been smoking an average of .5 packs per day. She has never used smokeless tobacco. She reports that she does not drink alcohol and does not use drugs. ? ?ROS: ?  ? ?  ? ?  ? ?  ? ?  ? ?  ? ?  ? ?  ? ?  ? ?  ? ?  ? ?  ? ?  ? ?Physical Exam: ?There were no vitals taken for this visit.  ?Constitutional:  Alert and oriented, No acute distress. ?HEENT: [place] AT, moist mucus membranes.  Trachea midline, no masses. ?Cardiovascular: No clubbing, cyanosis, or edema. ?Respiratory: Normal respiratory effort, no increased work of breathing. ?GI: Abdomen is soft, nontender, nondistended, no abdominal masses ?GU: Patient had grade 2 hypermobility the bladder neck.  She has some vascularity underneath the vaginal epithelium.  No stress incontinence.  No significant prolapse.  She reported a little bit of a swelling on the right buttock cheek closer to the anus.  There was no pus or obvious abscess.  There was a mild indurated elevation ?Skin: No rashes, bruises or suspicious lesions. ?Lymph: No cervical or inguinal adenopathy. ?Neurologic: Grossly intact, no focal deficits, moving all 4 extremities. ?Psychiatric: Normal mood and affect. ? ?Laboratory Data: ?Lab Results  ?Component Value Date  ? WBC 5.9 11/27/2014  ? HGB 12.2 11/27/2014  ? HCT 38.1 11/27/2014  ? MCV  90.5 11/27/2014  ? PLT 134 (L) 11/27/2014  ? ? ?Lab Results  ?Component Value Date  ? CREATININE 2.27 (H) 07/06/2018  ? ? ?No results found for: PSA ? ?No results found for: TESTOSTERONE ? ?Lab Results  ?Component Value Date  ? HGBA1C 7.2 (H) 12/24/2012  ? ? ?Urinalysis ?   ?Component Value Date/Time  ? COLORURINE Yellow 03/29/2014 1247  ? APPEARANCEUR Clear 03/29/2014 1247  ? LABSPEC 1.016 03/29/2014 1247  ? PHURINE 5.0 03/29/2014 1247  ? GLUCOSEU Negative 03/29/2014 1247  ? HGBUR 1+ 03/29/2014 1247  ?  BILIRUBINUR Negative 03/29/2014 1247  ? KETONESUR Negative 03/29/2014 1247  ? PROTEINUR Negative 03/29/2014 1247  ? NITRITE Negative 03/29/2014 1247  ? LEUKOCYTESUR Negative 03/29/2014 1247  ? ? ?Pertinent Imaging: ?Urine reviewed.  Urine sent for culture.  Chart reviewed ? ?Assessment & Plan: Patient has mixed incontinence.  She is legally blind.  She is significant frequency triggered when she gets up out of a chair.  She gets up 3-4 times a night.  I will try to treat her with medical and behavioral therapy.  She understands she may need to test in [place] in the future.  She was also reluctant to have a pelvic examination today and want to make certain that would not be uncomfortable.  I will also hold off on cystoscopy ? ?Called in Keflex for skin area noted.  Otherwise follow-up with primary care.  Reassess on Myrbetriq 50 mg samples and prescription in 5 or 6 weeks ? ? ? ?1. Hematuria, unspecified type ? ?- Urinalysis, Complete ? ? ?No follow-ups on file. ? ?Reece Packer, MD ? ?[facility] ?[address] ?[place], [place] [address] ?[phone] ?  ?

## 2021-08-20 NOTE — Addendum Note (Signed)
Addended by: Verlene Mayer A on: 08/20/2021 10:27 AM ? ? Modules accepted: Orders ? ?

## 2021-08-23 LAB — CULTURE, URINE COMPREHENSIVE

## 2021-10-01 ENCOUNTER — Ambulatory Visit: Payer: Medicare HMO | Admitting: Urology

## 2021-10-01 VITALS — BP 152/77 | HR 80 | Wt 219.0 lb

## 2021-10-01 DIAGNOSIS — N3946 Mixed incontinence: Secondary | ICD-10-CM

## 2021-10-01 MED ORDER — MIRABEGRON ER 50 MG PO TB24: 50.0000 mg | ORAL_TABLET | Freq: Every day | ORAL | 3 refills | Status: DC

## 2021-10-01 NOTE — Progress Notes (Signed)
? ?10/01/2021 ?10:09 AM  ? ?Michelle Murillo ?[birth year] ?[id number] ? ?Referring provider: Tracie Harrier, MD ?[place] ?[place],  [place] [address] ? ?Chief Complaint  ?Patient presents with  ? Hematuria  ? ? ?HPI: ?I was consulted to assist the patient's urinary incontinence.  She has urge incontinence especially when she goes from a sitting to standing position.  She leaks with coughing sneezing and both of these significant.  I think she has foot on the floor syndrome but no bedwetting. ? ?She voids 3-4 times a night.  She says she voids every time she gets out of a chair and cannot hold it for 2 hours.  Flow was good ?  ?No hysterectomy   ? ?Patient had grade 2 hypermobility the bladder neck.  She has some vascularity underneath the vaginal epithelium.  No stress incontinence.  No significant prolapse.  She reported a little bit of a swelling on the right buttock cheek closer to the anus.  There was no pus or obvious abscess.  There was a mild indurated elevation ? ?Patient has mixed incontinence.  She is legally blind.  She is significant frequency triggered when she gets up out of a chair.  She gets up 3-4 times a night.  I will try to treat her with medical and behavioral therapy.  She understands she may need to test in [facility] in the future.  She was also reluctant to have a pelvic examination today and want to make certain that would not be uncomfortable.  I will also hold off on cystoscopy ?  ?Called in Keflex for skin area noted.  Otherwise follow-up with primary care.  Reassess on Myrbetriq 50 mg samples and prescription in 5 or 6 weeks ? ?Today ?Frequency stable ?Having more time to get to the bathroom with less urge incontinence.  Getting up once and twice instead of 3 times.  She lost the prescription ? ?PMH: ?Past Medical History:  ?Diagnosis Date  ? Abdominal aneurysm   ? Cancer [facility])   ? 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 CHF  (congestive heart failure) ([place])   ? Chronic kidney disease 2019  ? stage IV chronic kidney disease. now functions at 35%  ? Complication of anesthesia 2013  ? "stops breathing and goes into convulsions".  pt was in a coma for 18 days after this.(during CABG)  ? Complication of anesthesia   ? dr. Doris Cheadle is attending provider. surgery was at [facility]  ? COPD (chronic obstructive pulmonary disease) ([place])   ? Coronary artery disease   ? Diabetes mellitus without complication ([place])   ? borderline, does not take any medication  ? GERD (gastroesophageal reflux disease)   ? Gout   ? Hypertension   ? Legally blind   ? unable to see any more than shapes  ? Seizures ([place]) 2013  ? only happened during cabg procedure while on the table  ? ? ?Surgical History: ?Past Surgical History:  ?Procedure Laterality Date  ? ABDOMINAL AORTOGRAM N/A 06/23/2017  ? Procedure: ABDOMINAL AORTOGRAM;  Surgeon: Algernon Huxley, MD;  Location: [facility];  Service: Cardiovascular;  Laterality: N/A;  ? ABDOMINAL SURGERY  2017  ? aneurysm repair with dr. dew  ? BRONCHOSCOPY  2015  ? done at [facility] carcinoid of airways  ? BYPASS GRAFT  2013  ? cabg x 4  ? CARDIAC SURGERY  2013  ? cabg  ? CHOLECYSTECTOMY    ?  age 76  ? CORONARY ARTERY BYPASS GRAFT    ? ENDOVASCULAR REPAIR/STENT GRAFT N/A 07/06/2018  ? Procedure: ENDOVASCULAR REPAIR/STENT GRAFT;  Surgeon: Algernon Huxley, MD;  Location: [facility];  Service: Cardiovascular;  Laterality: N/A;  ? laryngeal nodule surgery    ? LEFT HEART CATH AND CORS/GRAFTS ANGIOGRAPHY Left 03/07/2020  ? Procedure: LEFT HEART CATH AND CORS/GRAFTS ANGIOGRAPHY;  Surgeon: Corey Skains, MD;  Location: [facility];  Service: Cardiovascular;  Laterality: Left;  ? ? ?Home Medications:  ?Allergies as of 5/77/2023   ? ?   Reactions  ? Isosorbide Nitrate Nausea And Vomiting  ? Tylenol [acetaminophen] Other (See Comments)  ? Hallucinations "sees things"  ? Bupropion Nausea Only  ? Other reaction(s): Other (See  Comments) ?"I didn't do well with it.".hallucinates  ? Morphine And Related Itching  ? ?  ? ?  ?Medication List  ?  ? ?  ? Accurate as of Oct 01, 2021 10:09 AM. If you have any questions, ask your nurse or doctor.  ?  ?  ? ?  ? ?STOP taking these medications   ? ?cephALEXin 250 MG capsule ?Commonly known as: Keflex ?  ? ?  ? ?TAKE these medications   ? ?allopurinol 300 MG tablet ?Commonly known as: ZYLOPRIM ?Take 300 mg by mouth daily. ?  ?amLODipine 5 MG tablet ?Commonly known as: NORVASC ?Take 5 mg by mouth daily. ?  ?aspirin EC 81 MG tablet ?Take 81 mg by mouth daily. ?  ?atorvastatin 20 MG tablet ?Commonly known as: LIPITOR ?Take 20 mg by mouth daily. ?  ?calcitRIOL 0.25 MCG capsule ?Commonly known as: ROCALTROL ?Take 0.25 mcg by mouth daily. ?  ?furosemide 40 MG tablet ?Commonly known as: LASIX ?Take 40 mg by mouth daily as needed (for fluid retention (swollen ankles)---typically no more than twice a week). ?  ?isosorbide mononitrate 30 MG 24 hr tablet ?Commonly known as: IMDUR ?Take 30 mg by mouth daily. ?  ?losartan 100 MG tablet ?Commonly known as: COZAAR ?Take 100 mg by mouth daily. ?  ?metoprolol tartrate 50 MG tablet ?Commonly known as: LOPRESSOR ?Take 50 mg by mouth 2 (two) times daily. ?What changed: Another medication with the same name was removed. Continue taking this medication, and follow the directions you see here. ?  ?mirabegron ER 50 MG Tb24 tablet ?Commonly known as: MYRBETRIQ ?Take 1 tablet (50 mg total) by mouth daily. ?  ?omeprazole 40 MG capsule ?Commonly known as: PRILOSEC ?Take 40 mg by mouth daily as needed (for acid reflux/indigestion.). ?  ? ?  ? ? ?Allergies:  ?Allergies  ?Allergen Reactions  ? Isosorbide Nitrate Nausea And Vomiting  ? Tylenol [Acetaminophen] Other (See Comments)  ?  Hallucinations "sees things"  ? Bupropion Nausea Only  ?  Other reaction(s): Other (See Comments) ?"I didn't do well with it.".hallucinates  ? Morphine And Related Itching  ? ? ?Family History: ?Family  History  ?Problem Relation Age of Onset  ? Congestive Heart Failure Mother   ? Heart attack Mother   ? Cancer Father   ? Liver cancer Father   ? Cancer Paternal Grandfather   ? ? ?Social History:  reports that she has been smoking cigarettes. She has been smoking an average of .5 packs per day. She has never used smokeless tobacco. She reports that she does not drink alcohol and does not use drugs. ? ?ROS: ?  ? ?  ? ?  ? ?  ? ?  ? ?  ? ?  ? ?  ? ?  ? ?  ? ?  ? ?  ? ?  ? ?  Physical Exam: ?BP (!) 152/77   Pulse 80   Wt 99.3 kg   BMI 36.44 kg/m?   ?Constitutional:  Alert and oriented, No acute distress. ? ? ?Laboratory Data: ?Lab Results  ?Component Value Date  ? WBC 5.9 11/27/2014  ? HGB 12.2 11/27/2014  ? HCT 38.1 11/27/2014  ? MCV 90.5 11/27/2014  ? PLT 134 (L) 11/27/2014  ? ? ?Lab Results  ?Component Value Date  ? CREATININE 2.27 (H) 07/06/2018  ? ? ?No results found for: PSA ? ?No results found for: TESTOSTERONE ? ?Lab Results  ?Component Value Date  ? HGBA1C 7.2 (H) 12/24/2012  ? ? ?Urinalysis ?   ?Component Value Date/Time  ? COLORURINE Yellow 03/29/2014 1247  ? APPEARANCEUR Clear 03/29/2014 1247  ? LABSPEC 1.016 03/29/2014 1247  ? PHURINE 5.0 03/29/2014 1247  ? GLUCOSEU Negative 03/29/2014 1247  ? HGBUR 1+ 03/29/2014 1247  ? BILIRUBINUR Negative 03/29/2014 1247  ? KETONESUR Negative 03/29/2014 1247  ? PROTEINUR Negative 03/29/2014 1247  ? NITRITE Negative 03/29/2014 1247  ? LEUKOCYTESUR Negative 03/29/2014 1247  ? ? ?Pertinent Imaging: ? ? ?Assessment & Plan: 90x3 Myrbetriq 50 mg samples and prescription sent.  Reassess efficacy in 3 months.  She understands she can have good days and bad days.  We can always switch to medication then. ? ?There are no diagnoses linked to this encounter. ? ?No follow-ups on file. ? ?Reece Packer, MD ? ?[facility] ?[address], [address] ?[place], [place] [address] ?[phone] ?  ?

## 2021-10-24 DIAGNOSIS — E1122 Type 2 diabetes mellitus with diabetic chronic kidney disease: Secondary | ICD-10-CM | POA: Diagnosis not present

## 2021-10-24 DIAGNOSIS — I10 Essential (primary) hypertension: Secondary | ICD-10-CM | POA: Diagnosis not present

## 2021-10-24 DIAGNOSIS — R809 Proteinuria, unspecified: Secondary | ICD-10-CM | POA: Diagnosis not present

## 2021-10-24 DIAGNOSIS — N184 Chronic kidney disease, stage 4 (severe): Secondary | ICD-10-CM | POA: Diagnosis not present

## 2021-10-24 DIAGNOSIS — N2581 Secondary hyperparathyroidism of renal origin: Secondary | ICD-10-CM | POA: Diagnosis not present

## 2021-10-30 DIAGNOSIS — I2510 Atherosclerotic heart disease of native coronary artery without angina pectoris: Secondary | ICD-10-CM | POA: Diagnosis not present

## 2021-10-30 DIAGNOSIS — F17210 Nicotine dependence, cigarettes, uncomplicated: Secondary | ICD-10-CM | POA: Diagnosis not present

## 2021-10-30 DIAGNOSIS — E669 Obesity, unspecified: Secondary | ICD-10-CM | POA: Diagnosis not present

## 2021-10-30 DIAGNOSIS — Z0000 Encounter for general adult medical examination without abnormal findings: Secondary | ICD-10-CM | POA: Diagnosis not present

## 2021-10-30 DIAGNOSIS — Z951 Presence of aortocoronary bypass graft: Secondary | ICD-10-CM | POA: Diagnosis not present

## 2021-10-30 DIAGNOSIS — N184 Chronic kidney disease, stage 4 (severe): Secondary | ICD-10-CM | POA: Diagnosis not present

## 2021-10-30 DIAGNOSIS — D631 Anemia in chronic kidney disease: Secondary | ICD-10-CM | POA: Diagnosis not present

## 2021-10-30 DIAGNOSIS — Z1389 Encounter for screening for other disorder: Secondary | ICD-10-CM | POA: Diagnosis not present

## 2021-10-30 DIAGNOSIS — Z6836 Body mass index (BMI) 36.0-36.9, adult: Secondary | ICD-10-CM | POA: Diagnosis not present

## 2021-10-30 DIAGNOSIS — I129 Hypertensive chronic kidney disease with stage 1 through stage 4 chronic kidney disease, or unspecified chronic kidney disease: Secondary | ICD-10-CM | POA: Diagnosis not present

## 2021-10-30 DIAGNOSIS — E1122 Type 2 diabetes mellitus with diabetic chronic kidney disease: Secondary | ICD-10-CM | POA: Diagnosis not present

## 2021-10-30 DIAGNOSIS — M109 Gout, unspecified: Secondary | ICD-10-CM | POA: Diagnosis not present

## 2021-11-22 DIAGNOSIS — I25708 Atherosclerosis of coronary artery bypass graft(s), unspecified, with other forms of angina pectoris: Secondary | ICD-10-CM | POA: Diagnosis not present

## 2022-01-07 ENCOUNTER — Ambulatory Visit: Payer: Medicare HMO | Admitting: Urology

## 2022-01-07 DIAGNOSIS — H548 Legal blindness, as defined in USA: Secondary | ICD-10-CM | POA: Insufficient documentation

## 2022-01-07 DIAGNOSIS — I700 Atherosclerosis of aorta: Secondary | ICD-10-CM | POA: Insufficient documentation

## 2022-01-17 DIAGNOSIS — E1142 Type 2 diabetes mellitus with diabetic polyneuropathy: Secondary | ICD-10-CM | POA: Insufficient documentation

## 2022-01-21 ENCOUNTER — Ambulatory Visit: Payer: Medicare HMO | Admitting: Urology

## 2022-06-20 ENCOUNTER — Telehealth (INDEPENDENT_AMBULATORY_CARE_PROVIDER_SITE_OTHER): Payer: Self-pay

## 2022-06-20 NOTE — Telephone Encounter (Signed)
Spoke with the patient and she is scheduled with Dr. Lucky Cowboy for a permcath insertion on 06/24/22 with [facility] 8:[facility] Pre-procedure instructions were discussed and patient stated she wrote this information down. Patient stated she was legally blind. Patient stated she didn't understand if she would be put to sleep and had other questions about the procedure and reasons for it. I advised she would be given "twilight anesthesia" and if she needed to come in and speak with Dr. Lucky Cowboy regarding this procedure we could get her in to be seen to answer her questions. I also advised that she contact her nephrologist as well to get a better understanding of her situation and what having a permcath means. Patient stated she would call her nephrologist and PCP as well. Patient declined to make an appt to speak with Dr. Lucky Cowboy.

## 2022-06-24 ENCOUNTER — Other Ambulatory Visit: Payer: Self-pay

## 2022-06-24 ENCOUNTER — Encounter: Payer: Self-pay | Admitting: Vascular Surgery

## 2022-06-24 ENCOUNTER — Encounter
Admission: RE | Disposition: A | Discharge status: Home / Self Care | Payer: Self-pay | Source: Ambulatory Visit | Attending: Vascular Surgery

## 2022-06-24 ENCOUNTER — Ambulatory Visit
Admission: RE | Admit: 2022-06-24 | Discharge: 2022-06-24 | Disposition: A | Discharge status: Home / Self Care | Payer: Medicare HMO | Source: Ambulatory Visit | Attending: Vascular Surgery | Admitting: Vascular Surgery

## 2022-06-24 DIAGNOSIS — Z9889 Other specified postprocedural states: Secondary | ICD-10-CM

## 2022-06-24 DIAGNOSIS — N186 End stage renal disease: Secondary | ICD-10-CM | POA: Insufficient documentation

## 2022-06-24 DIAGNOSIS — E1122 Type 2 diabetes mellitus with diabetic chronic kidney disease: Secondary | ICD-10-CM | POA: Diagnosis not present

## 2022-06-24 DIAGNOSIS — I509 Heart failure, unspecified: Secondary | ICD-10-CM | POA: Insufficient documentation

## 2022-06-24 DIAGNOSIS — I132 Hypertensive heart and chronic kidney disease with heart failure and with stage 5 chronic kidney disease, or end stage renal disease: Secondary | ICD-10-CM | POA: Diagnosis not present

## 2022-06-24 DIAGNOSIS — I723 Aneurysm of iliac artery: Secondary | ICD-10-CM

## 2022-06-24 DIAGNOSIS — N189 Chronic kidney disease, unspecified: Secondary | ICD-10-CM

## 2022-06-24 DIAGNOSIS — I120 Hypertensive chronic kidney disease with stage 5 chronic kidney disease or end stage renal disease: Secondary | ICD-10-CM

## 2022-06-24 DIAGNOSIS — F17210 Nicotine dependence, cigarettes, uncomplicated: Secondary | ICD-10-CM

## 2022-06-24 HISTORY — PX: DIALYSIS/PERMA CATHETER INSERTION: CATH118288

## 2022-06-24 LAB — POTASSIUM (ARMC VASCULAR LAB ONLY): Potassium (ARMC vascular lab): 4.7 mmol/L (ref 3.5–5.1)

## 2022-06-24 SURGERY — DIALYSIS/PERMA CATHETER INSERTION
Anesthesia: Moderate Sedation

## 2022-06-24 MED ORDER — FAMOTIDINE 20 MG PO TABS: 40.0000 mg | ORAL_TABLET | Freq: Once | ORAL | Status: DC | PRN

## 2022-06-24 MED ORDER — FENTANYL CITRATE PF 50 MCG/ML IJ SOSY
PREFILLED_SYRINGE | INTRAMUSCULAR | Status: AC
  Filled 2022-06-24: qty 1

## 2022-06-24 MED ORDER — CEFAZOLIN SODIUM-DEXTROSE 1-4 GM/50ML-% IV SOLN: 1.0000 g | INTRAVENOUS | Status: AC

## 2022-06-24 MED ORDER — DIPHENHYDRAMINE HCL 50 MG/ML IJ SOLN: 50.0000 mg | Freq: Once | INTRAMUSCULAR | Status: DC | PRN

## 2022-06-24 MED ORDER — MIDAZOLAM HCL 2 MG/ML PO SYRP: 8.0000 mg | ORAL_SOLUTION | Freq: Once | ORAL | Status: DC | PRN

## 2022-06-24 MED ORDER — FENTANYL CITRATE (PF) 100 MCG/2ML IJ SOLN
INTRAMUSCULAR | Status: DC | PRN
  Administered 2022-06-24 (×2): 50 ug via INTRAVENOUS

## 2022-06-24 MED ORDER — MIDAZOLAM HCL 2 MG/2ML IJ SOLN
INTRAMUSCULAR | Status: AC
  Filled 2022-06-24: qty 2

## 2022-06-24 MED ORDER — MIDAZOLAM HCL 2 MG/2ML IJ SOLN
INTRAMUSCULAR | Status: DC | PRN
  Administered 2022-06-24: 2 mg via INTRAVENOUS

## 2022-06-24 MED ORDER — CEFAZOLIN SODIUM-DEXTROSE 1-4 GM/50ML-% IV SOLN
INTRAVENOUS | Status: AC
  Administered 2022-06-24: 1 g via INTRAVENOUS
  Filled 2022-06-24: qty 50

## 2022-06-24 MED ORDER — ONDANSETRON HCL 4 MG/2ML IJ SOLN: 4.0000 mg | Freq: Four times a day (QID) | INTRAMUSCULAR | Status: DC | PRN

## 2022-06-24 MED ORDER — SODIUM CHLORIDE 0.9 % IV SOLN: INTRAVENOUS | Status: DC

## 2022-06-24 MED ORDER — HYDROMORPHONE HCL 1 MG/ML IJ SOLN: 1.0000 mg | Freq: Once | INTRAMUSCULAR | Status: DC | PRN

## 2022-06-24 MED ORDER — METHYLPREDNISOLONE SODIUM SUCC 125 MG IJ SOLR: 125.0000 mg | Freq: Once | INTRAMUSCULAR | Status: DC | PRN

## 2022-06-24 SURGICAL SUPPLY — 6 items
CATH PALIN MAXID VT KIT 19CM (CATHETERS) IMPLANT
COVER PROBE U/S 5X48 (MISCELLANEOUS) IMPLANT
DERMABOND ADVANCED .7 DNX12 (GAUZE/BANDAGES/DRESSINGS) IMPLANT
PACK ANGIOGRAPHY (CUSTOM PROCEDURE TRAY) IMPLANT
SUT MNCRL AB 4-0 PS2 18 (SUTURE) IMPLANT
SUT PROLENE 0 CT 1 30 (SUTURE) IMPLANT

## 2022-06-24 NOTE — Discharge Instructions (Signed)
Tunneled Catheter Insertion, Care After The following information offers guidance on how to care for yourself after your procedure. Your health care provider may also give you more specific instructions. If you have problems or questions, contact your health care provider. What can I expect after the procedure? After the procedure, it is common to have: Some mild redness, bruising, swelling, and pain around your catheter site. A small amount of blood or clear fluid coming from your incisions. Follow these instructions at home: Medicines Take over-the-counter and prescription medicines only as told by your health care provider. If you were prescribed an antibiotic medicine, take it as told by your health care provider. Do not stop taking the antibiotic even if you start to feel better. Incision care  Follow instructions from your health care provider about how to take care of your incisions. Make sure you: Your dialysis center will perform all needed dressing changes.  Leave stitches (sutures), skin glue, or adhesive strips in place. These skin closures may need to stay in place for 2 weeks or longer. Keep your dressings clean and dry. Check your incision areas every day for signs of infection. Check for: More redness, swelling, or pain. More fluid or blood. Warmth. Pus or a bad smell. Catheter care  Keep your catheter site clean and dry. Do not shower, sponge bath only while catheter in place.  Your dialysis center will Flush your catheter per the dialysis center protocol.  This helps prevent it from becoming clogged. Leave thecaps on the ends of the catheter when not in use. Do not pull on your catheter. Activity Return to your normal activities as told by your health care provider. Ask your health care provider what activities are safe for you. Follow any other activity restrictions as instructed by your health care provider. Do not lift anything that is heavier than 10 lb (4.5 kg), or  the limit that you are told, until your health care provider says that it is safe. Driving Do not drive for 24 hours.  General instructions Follow your health care provider's specific instructions for the type of catheter that you have. Do not take baths, swim, or use a hot tub while your catheter is in place. Keep all follow-up visits. This is important to prevent infection.  Contact a health care provider if: You feel unusually weak or nauseous. You have a fever or chills. You have more redness, swelling, or pain at your incisions or around the area where your catheter has been inserted or where it exits. You have pus or a bad smell coming from your catheter site. Your catheter site feels warm to the touch. Your catheter is not working properly. Fluid is leaking from the catheter, under the dressing, or around the dressing. Your dialysis center is unable to flush your catheter. Get help right away if: Your catheter develops a hole or it breaks. Your catheter comes loose or gets pulled completely out. If this happens, press on your catheter site firmly with a clean cloth until you can get medical help. You develop bleeding from your catheter or your insertion site, and your bleeding does not stop. You have swelling in your shoulder, neck, chest, or face. You have pain or swelling when fluids or medicines are being given through the catheter. You have chest pain or difficulty breathing. These symptoms may represent a serious problem that is an emergency. Do not wait to see if the symptoms will go away. Get medical help right away. Call your  local emergency services (911 in the [place] Do not drive yourself to the hospital. Summary After the procedure, it is common to have mild redness, swelling, and pain around your catheter site. Return to your normal activities as told by your health care provider. Ask your health care provider what activities are safe for you. Follow your health care  provider's specific instructions for the type of catheter that you have. Keep your catheter site and your dressings clean and dry. Contact a health care provider if your catheter is not working properly. Get help right away if you have chest pain, difficulty breathing, or your catheter comes loose or gets pulled completely out. This information is not intended to replace advice given to you by your health care provider. Make sure you discuss any questions you have with your health care provider. Document Revised: 09/18/2020 Document Reviewed: 09/18/2020 Elsevier Patient Education  [place] Catheter Insertion, Care After The following information offers guidance on how to care for yourself after your procedure. Your health care provider may also give you more specific instructions. If you have problems or questions, contact your health care provider. What can I expect after the procedure? After the procedure, it is common to have: Some mild redness, bruising, swelling, and pain around your catheter site. A small amount of blood or clear fluid coming from your incisions. Follow these instructions at home: Medicines Take over-the-counter and prescription medicines only as told by your health care provider. If you were prescribed an antibiotic medicine, take it as told by your health care provider. Do not stop taking the antibiotic even if you start to feel better. Incision care  Follow instructions from your health care provider about how to take care of your incisions. Make sure you: Your dialysis center will perform all needed dressing changes.  Leave stitches (sutures), skin glue, or adhesive strips in place. These skin closures may need to stay in place for 2 weeks or longer. Keep your dressings clean and dry. Check your incision areas every day for signs of infection. Check for: More redness, swelling, or pain. More fluid or blood. Warmth. Pus or a bad  smell. Catheter care  Keep your catheter site clean and dry. Do not shower, sponge bath only while catheter in place.  Your dialysis center will Flush your catheter per the dialysis center protocol.  This helps prevent it from becoming clogged. Leave thecaps on the ends of the catheter when not in use. Do not pull on your catheter. Activity Return to your normal activities as told by your health care provider. Ask your health care provider what activities are safe for you. Follow any other activity restrictions as instructed by your health care provider. Do not lift anything that is heavier than 10 lb (4.5 kg), or the limit that you are told, until your health care provider says that it is safe. Driving Do not drive for 24 hours.  General instructions Follow your health care provider's specific instructions for the type of catheter that you have. Do not take baths, swim, or use a hot tub while your catheter is in place. Keep all follow-up visits. This is important to prevent infection.  Contact a health care provider if: You feel unusually weak or nauseous. You have a fever or chills. You have more redness, swelling, or pain at your incisions or around the area where your catheter has been inserted or where it exits. You have pus or a bad  smell coming from your catheter site. Your catheter site feels warm to the touch. Your catheter is not working properly. Fluid is leaking from the catheter, under the dressing, or around the dressing. Your dialysis center is unable to flush your catheter. Get help right away if: Your catheter develops a hole or it breaks. Your catheter comes loose or gets pulled completely out. If this happens, press on your catheter site firmly with a clean cloth until you can get medical help. You develop bleeding from your catheter or your insertion site, and your bleeding does not stop. You have swelling in your shoulder, neck, chest, or face. You have pain or  swelling when fluids or medicines are being given through the catheter. You have chest pain or difficulty breathing. These symptoms may represent a serious problem that is an emergency. Do not wait to see if the symptoms will go away. Get medical help right away. Call your local emergency services (911 in the [place] Do not drive yourself to the hospital. Summary After the procedure, it is common to have mild redness, swelling, and pain around your catheter site. Return to your normal activities as told by your health care provider. Ask your health care provider what activities are safe for you. Follow your health care provider's specific instructions for the type of catheter that you have. Keep your catheter site and your dressings clean and dry. Contact a health care provider if your catheter is not working properly. Get help right away if you have chest pain, difficulty breathing, or your catheter comes loose or gets pulled completely out. This information is not intended to replace advice given to you by your health care provider. Make sure you discuss any questions you have with your health care provider. Document Revised: 09/18/2020 Document Reviewed: 09/18/2020 Elsevier Patient Education  [place].

## 2022-06-24 NOTE — H&P (Signed)
[facility] Admission History & Physical  MRN : [id number]  Michelle Murillo is a 77 y.o. (1945/06/26) female who presents with chief complaint of No chief complaint on file. Marland Kitchen  History of Present Illness: Patient presents today for PermCath placement.  She has longstanding chronic kidney disease that has progressed to end-stage renal failure.  Her nephrologist have decided to initiate dialysis and a PermCath is necessary.  Current Facility-Administered Medications  Medication Dose Route Frequency Provider Last Rate Last Admin   fentaNYL (SUBLIMAZE) 50 MCG/ML injection            midazolam (VERSED) 2 MG/2ML injection            0.9 %  sodium chloride infusion   Intravenous Continuous Kris Hartmann, NP 10 mL/hr at 06/24/22 0902 New Bag at 06/24/22 0902   ceFAZolin (ANCEF) IVPB 1 g/50 mL premix  1 g Intravenous 30 min Pre-Op Kris Hartmann, NP 100 mL/hr at 06/24/22 0938 1 g at 06/24/22 4098   diphenhydrAMINE (BENADRYL) injection 50 mg  50 mg Intravenous Once PRN Kris Hartmann, NP       famotidine (PEPCID) tablet 40 mg  40 mg Oral Once PRN Kris Hartmann, NP       HYDROmorphone (DILAUDID) injection 1 mg  1 mg Intravenous Once PRN Kris Hartmann, NP       methylPREDNISolone sodium succinate (SOLU-MEDROL) 125 mg/2 mL injection 125 mg  125 mg Intravenous Once PRN Kris Hartmann, NP       midazolam (VERSED) 2 MG/ML syrup 8 mg  8 mg Oral Once PRN Kris Hartmann, NP       ondansetron [facility]) injection 4 mg  4 mg Intravenous Q6H PRN Kris Hartmann, NP        Past Medical History:  Diagnosis Date   Abdominal aneurysm ([place])    Cancer ([place])    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 now functions at 11%   Complication of anesthesia 2013   "stops breathing and goes into convulsions".  pt was in a coma for 18 days after this.(during  CABG)   Complication of anesthesia    dr. Doris Cheadle is attending provider. surgery was at [facility]   COPD (chronic obstructive pulmonary disease) ([place])    Coronary artery disease    Diabetes mellitus without complication (HCC)    borderline, does not take any medication   GERD (gastroesophageal reflux disease)    Gout    Hypertension    Legally blind    unable to see any more than shapes   Seizures ([place]) 2013   only happened during cabg procedure while on the table    Past Surgical History:  Procedure Laterality Date   ABDOMINAL AORTOGRAM N/A 06/23/2017   Procedure: ABDOMINAL AORTOGRAM;  Surgeon: Algernon Huxley, MD;  Location: [facility];  Service: Cardiovascular;  Laterality: N/A;   ABDOMINAL SURGERY  2017   aneurysm repair with dr. Tamrah Victorino   BRONCHOSCOPY  2015   done at [place]. carcinoid of airways   BYPASS GRAFT  2013   cabg x 4   CARDIAC SURGERY  2013   cabg   CHOLECYSTECTOMY     age 31   CORONARY ARTERY BYPASS GRAFT     ENDOVASCULAR REPAIR/STENT GRAFT N/A 07/06/2018   Procedure: ENDOVASCULAR REPAIR/STENT  GRAFT;  Surgeon: Algernon Huxley, MD;  Location: [facility];  Service: Cardiovascular;  Laterality: N/A;   laryngeal nodule surgery     LEFT HEART CATH AND CORS/GRAFTS ANGIOGRAPHY Left 03/07/2020   Procedure: LEFT HEART CATH AND CORS/GRAFTS ANGIOGRAPHY;  Surgeon: Corey Skains, MD;  Location: [facility];  Service: Cardiovascular;  Laterality: Left;     Social History   Tobacco Use   Smoking status: Every Day    Packs/day: 0.50    Types: Cigarettes   Smokeless tobacco: Never   Tobacco comments:    6-7 cigs per day  Vaping Use   Vaping Use: Never used  Substance Use Topics   Alcohol use: No   Drug use: No     Family History  Problem Relation Age of Onset   Congestive Heart Failure Mother    Heart attack Mother    Cancer Father    Liver cancer Father    Cancer Paternal Grandfather     Allergies  Allergen Reactions   Isosorbide Nitrate  Nausea And Vomiting   Bupropion Nausea Only    Other reaction(s): Other (See Comments) "I didn't do well with it.".hallucinates   Morphine And Related Itching     REVIEW OF SYSTEMS (Negative unless checked)  Constitutional: [] Weight loss  [] Fever  [] Chills Cardiac: [] Chest pain   [] Chest pressure   [] Palpitations   [] Shortness of breath when laying flat   [] Shortness of breath at rest   [x] Shortness of breath with exertion. Vascular:  [] Pain in legs with walking   [] Pain in legs at rest   [] Pain in legs when laying flat   [] Claudication   [] Pain in feet when walking  [] Pain in feet at rest  [] Pain in feet when laying flat   [] History of DVT   [] Phlebitis   [] Swelling in legs   [] Varicose veins   [] Non-healing ulcers Pulmonary:   [] Uses home oxygen   [] Productive cough   [] Hemoptysis   [] Wheeze  [x] COPD   [] Asthma Neurologic:  [] Dizziness  [] Blackouts   [] Seizures   [] History of stroke   [] History of TIA  [] Aphasia   [] Temporary blindness   [] Dysphagia   [] Weakness or numbness in arms   [] Weakness or numbness in legs Musculoskeletal:  [x] Arthritis   [] Joint swelling   [x] Joint pain   [] Low back pain Hematologic:  [] Easy bruising  [] Easy bleeding   [] Hypercoagulable state   [x] Anemic  [] Hepatitis Gastrointestinal:  [] Blood in stool   [] Vomiting blood  [] Gastroesophageal reflux/heartburn   [] Difficulty swallowing. Genitourinary:  [x] Chronic kidney disease   [] Difficult urination  [] Frequent urination  [] Burning with urination   [] Blood in urine Skin:  [] Rashes   [] Ulcers   [] Wounds Psychological:  [] History of anxiety   []  History of major depression.  Physical Examination  Vitals:   06/24/22 0858  BP: 129/86  Pulse: 74  Resp: 16  Temp: (!) 97.5 F (36.4 C)  TempSrc: Oral  SpO2: 97%  Weight: 216 lb (98 kg)  Height: 5\' 5"  (1.651 m)   Body mass index is 35.94 kg/m. Gen: WD/WN, NAD Head: [place]/AT, No temporalis wasting.  Ear/Nose/Throat: Hearing grossly intact, nares w/o erythema or  drainage, oropharynx w/o Erythema/Exudate,  Eyes: Conjunctiva clear, sclera non-icteric Neck: Trachea midline.  No JVD.  Pulmonary:  Good air movement, respirations not labored, no use of accessory muscles.  Cardiac: RRR, normal S1, S2. Vascular:  Vessel Right Left  Radial Palpable Palpable  Musculoskeletal: M/S 5/5 throughout.  Extremities without ischemic changes.  No deformity or atrophy.  Neurologic: Sensation grossly intact in extremities.  Symmetrical.  Speech is fluent. Motor exam as listed above. Psychiatric: Judgment intact, Mood & affect appropriate for pt's clinical situation. Dermatologic: No rashes or ulcers noted.  No cellulitis or open wounds.      CBC Lab Results  Component Value Date   WBC 5.9 11/27/2014   HGB 12.2 11/27/2014   HCT 38.1 11/27/2014   MCV 90.5 11/27/2014   PLT 134 (L) 11/27/2014    BMET    Component Value Date/Time   NA 142 11/27/2014 1719   NA 145 04/07/2014 0435   K 4.5 11/27/2014 1719   K 4.5 04/07/2014 0435   CL 111 11/27/2014 1719   CL 108 (H) 04/07/2014 0435   CO2 26 11/27/2014 1719   CO2 31 04/07/2014 0435   GLUCOSE 125 (H) 11/27/2014 1719   GLUCOSE 90 04/07/2014 0435   BUN 37 (H) 07/06/2018 0930   BUN 25 (H) 04/07/2014 0435   CREATININE 2.27 (H) 07/06/2018 0930   CREATININE 1.95 (H) 04/07/2014 0435   CALCIUM 9.0 11/27/2014 1719   CALCIUM 7.8 (L) 04/07/2014 0435   GFRNONAA 21 (L) 07/06/2018 0930   GFRNONAA 27 (L) 04/07/2014 0435   GFRNONAA 24 (L) 12/24/2012 0435   GFRAA 24 (L) 07/06/2018 0930   GFRAA 33 (L) 04/07/2014 0435   GFRAA 28 (L) 12/24/2012 0435   CrCl cannot be calculated (Patient's most recent lab result is older than the maximum 21 days allowed.).  COAG Lab Results  Component Value Date   INR 0.93 11/12/2014   INR 1.1 04/07/2014    Radiology No results found.   Assessment/Plan 1.  End-stage renal disease.  Patient's renal failure has progressed over the years and she now requires  dialysis.  Permacath will be placed today.  Risks and benefits were discussed. 2.  Iliac artery aneurysm.  Status postrepair many years ago with no new symptoms. 3.  Hypertension.  Likely an underlying cause of renal failure and blood pressure control important in reducing the progression of atherosclerotic disease. On appropriate oral medications.     Leotis Pain, MD  06/24/2022 9:41 AM

## 2022-06-24 NOTE — Op Note (Signed)
OPERATIVE NOTE    PRE-OPERATIVE DIAGNOSIS: 1. ESRD   POST-OPERATIVE DIAGNOSIS: same as above  PROCEDURE: Ultrasound guidance for vascular access to the right internal jugular vein Fluoroscopic guidance for placement of catheter Placement of a 19 cm tip to cuff tunneled hemodialysis catheter via the right internal jugular vein  SURGEON: Leotis Pain, MD  ANESTHESIA:  Local with Moderate conscious sedation for approximately 22 minutes using 2 mg of Versed and 50 mcg of Fentanyl  ESTIMATED BLOOD LOSS: 10 cc  FLUORO TIME: less than one minute  CONTRAST: none  FINDING(S): 1.  Patent right internal jugular vein  SPECIMEN(S):  None  INDICATIONS:   Michelle Murillo is a 78 y.o.female who presents with renal failure.  The patient needs long term dialysis access for their ESRD, and a Permcath is necessary.  Risks and benefits are discussed and informed consent is obtained.    DESCRIPTION: After obtaining full informed written consent, the patient was brought back to the vascular suited. The patient's right neck and chest were sterilely prepped and draped in a sterile surgical field was created. Moderate conscious sedation was administered during a face to face encounter with the patient throughout the procedure with my supervision of the RN administering medicines and monitoring the patient's vital signs, pulse oximetry, telemetry and mental status throughout from the start of the procedure until the patient was taken to the recovery room.  The right internal jugular vein was visualized with ultrasound and found to be patent. It was then accessed under direct ultrasound guidance and a permanent image was recorded. A wire was placed. After skin nick and dilatation, the peel-away sheath was placed over the wire. I then turned my attention to an area under the clavicle. Approximately 1-2 fingerbreadths below the clavicle a small counterincision was created and tunneled from the subclavicular  incision to the access site. Using fluoroscopic guidance, a 19 centimeter tip to cuff tunneled hemodialysis catheter was selected, and tunneled from the subclavicular incision to the access site. It was then placed through the peel-away sheath and the peel-away sheath was removed. Using fluoroscopic guidance the catheter tips were parked in the right atrium. The appropriate distal connectors were placed. It withdrew blood well and flushed easily with heparinized saline and a concentrated heparin solution was then placed. It was secured to the chest wall with 2 Prolene sutures. The access incision was closed single 4-0 Monocryl. A 4-0 Monocryl pursestring suture was placed around the exit site. Sterile dressings were placed. The patient tolerated the procedure well and was taken to the recovery room in stable condition.  COMPLICATIONS: None  CONDITION: Stable  Leotis Pain, MD 06/24/2022 10:03 AM   This note was created with Dragon Medical transcription system. Any errors in dictation are purely unintentional.

## 2022-06-25 ENCOUNTER — Telehealth (INDEPENDENT_AMBULATORY_CARE_PROVIDER_SITE_OTHER): Payer: Self-pay

## 2022-06-25 NOTE — Telephone Encounter (Signed)
Patient called complaining of pain in her right side.  She would like to know what she can take or do for this.

## 2022-06-25 NOTE — Telephone Encounter (Signed)
Advised patient to take Tylenol.  They won't want her to take Ibuprofen with her renal failure.

## 2022-07-31 ENCOUNTER — Other Ambulatory Visit (INDEPENDENT_AMBULATORY_CARE_PROVIDER_SITE_OTHER): Payer: Medicare HMO

## 2022-07-31 ENCOUNTER — Encounter (INDEPENDENT_AMBULATORY_CARE_PROVIDER_SITE_OTHER): Payer: Medicare HMO

## 2022-07-31 ENCOUNTER — Encounter (INDEPENDENT_AMBULATORY_CARE_PROVIDER_SITE_OTHER): Payer: Medicare HMO | Admitting: Nurse Practitioner

## 2022-08-07 ENCOUNTER — Other Ambulatory Visit (INDEPENDENT_AMBULATORY_CARE_PROVIDER_SITE_OTHER): Payer: Self-pay | Admitting: Nurse Practitioner

## 2022-08-07 DIAGNOSIS — N186 End stage renal disease: Secondary | ICD-10-CM

## 2022-08-08 ENCOUNTER — Encounter (INDEPENDENT_AMBULATORY_CARE_PROVIDER_SITE_OTHER): Payer: Medicare HMO | Admitting: Nurse Practitioner

## 2022-08-08 ENCOUNTER — Ambulatory Visit (INDEPENDENT_AMBULATORY_CARE_PROVIDER_SITE_OTHER): Payer: Medicare HMO

## 2022-08-08 DIAGNOSIS — N186 End stage renal disease: Secondary | ICD-10-CM

## 2022-08-20 ENCOUNTER — Ambulatory Visit (INDEPENDENT_AMBULATORY_CARE_PROVIDER_SITE_OTHER): Payer: Medicare HMO | Admitting: Vascular Surgery

## 2022-08-20 ENCOUNTER — Encounter (INDEPENDENT_AMBULATORY_CARE_PROVIDER_SITE_OTHER): Payer: Self-pay | Admitting: Vascular Surgery

## 2022-08-20 VITALS — BP 130/76 | HR 64 | Resp 18 | Ht 65.5 in | Wt 210.0 lb

## 2022-08-20 DIAGNOSIS — Z992 Dependence on renal dialysis: Secondary | ICD-10-CM

## 2022-08-20 DIAGNOSIS — N186 End stage renal disease: Secondary | ICD-10-CM | POA: Diagnosis not present

## 2022-08-20 DIAGNOSIS — E119 Type 2 diabetes mellitus without complications: Secondary | ICD-10-CM | POA: Diagnosis not present

## 2022-08-20 DIAGNOSIS — I7143 Infrarenal abdominal aortic aneurysm, without rupture: Secondary | ICD-10-CM

## 2022-08-20 DIAGNOSIS — I10 Essential (primary) hypertension: Secondary | ICD-10-CM

## 2022-08-20 NOTE — Assessment & Plan Note (Signed)
The patient has adequate cephalic vein for fistula creation on either arm.  We would generally go to the nondominant (left for her) arm first.  I have discussed the creation of AV fistula in terms of the procedure and what the expected course would be.  I discussed a 6 to 8-week maturation phase before the fistula could be used.  I discussed the reasons that AV fistulas are generally preferred over catheter dependent dialysis.  I also discussed the difference between AV fistula and AV grafts.  She initiated discussion about peritoneal dialysis as well today.  She asks if she is a candidate for this.  Ultimately, that would be up to her nephrologist, and I am no longer doing the peritoneal dialysis catheter so that would be a different surgeon performing this if she chooses peritoneal dialysis.  I am happy to have an AV fistula creation scheduled for the near future at her convenience if she would like to proceed.  She wants to go home and consider her options and let us know.

## 2022-08-20 NOTE — H&P (View-Only) (Signed)
  Patient ID: Michelle Murillo, female   DOB: 12/05/1945, 76 y.o.   MRN: [id number]  Chief Complaint  Patient presents with   New Patient (Initial Visit)    np. ue art dup + ue v-map + consult. evaluate for new access. referred by lateef, munsoor      HPI Michelle Murillo is a 76 y.o. female.  I am asked to see the patient by Dr. Lateef for evaluation of AVF creation.  We placed a PermCath for new dialysis access earlier this year which has been Allises.  Her nephrologist has discussed with her options for dialysis that have included peritoneal dialysis and hemodialysis and she recently had a vein mapping which showed what appeared to be adequate cephalic vein on either arm for fistula creation.  She is right arm dominant.  She has not really had significant improvement in her lethargy and overall symptomatology since the initiation of dialysis.  She is very hesitant to have a fistula created.     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 now functions at 35%   Complication of anesthesia 2013   "stops breathing and goes into convulsions".  pt was in a coma for 18 days after this.(during CABG)   Complication of anesthesia    dr. mamoud is attending provider. surgery was at [facility]   COPD (chronic obstructive pulmonary disease) (HCC)    Coronary artery disease    Diabetes mellitus without complication (HCC)    borderline, does not take any medication   GERD (gastroesophageal reflux disease)    Gout    Hypertension    Legally blind    unable to see any more than shapes   Seizures (HCC) 2013   only happened during cabg procedure while on the table    Past Surgical History:  Procedure Laterality Date   ABDOMINAL AORTOGRAM N/A 06/23/2017   Procedure: ABDOMINAL AORTOGRAM;   Surgeon: Shabria Egley S, MD;  Location: [facility];  Service: Cardiovascular;  Laterality: N/A;   ABDOMINAL SURGERY  2017   aneurysm repair with dr. Faiga Stones   BRONCHOSCOPY  2015   done at [facility]. carcinoid of airways   BYPASS GRAFT  2013   cabg x 4   CARDIAC SURGERY  2013   cabg   CHOLECYSTECTOMY     age 22   CORONARY ARTERY BYPASS GRAFT     DIALYSIS/PERMA CATHETER INSERTION N/A 06/24/2022   Procedure: DIALYSIS/PERMA CATHETER INSERTION;  Surgeon: Liseth Wann S, MD;  Location: [facility];  Service: Cardiovascular;  Laterality: N/A;   ENDOVASCULAR REPAIR/STENT GRAFT N/A 07/06/2018   Procedure: ENDOVASCULAR REPAIR/STENT GRAFT;  Surgeon: Andie Mungin S, MD;  Location: [facility];  Service: Cardiovascular;  Laterality: N/A;   laryngeal nodule surgery     LEFT HEART CATH AND CORS/GRAFTS ANGIOGRAPHY Left 03/07/2020   Procedure: LEFT HEART CATH AND CORS/GRAFTS ANGIOGRAPHY;  Surgeon: Kowalski, Bruce J, MD;  Location: [facility];  Service: Cardiovascular;  Laterality: Left;     Family History  Problem Relation Age of Onset   Congestive Heart Failure Mother    Heart attack Mother    Cancer Father    Liver cancer Father    Cancer Paternal Grandfather         Social History   Tobacco Use   Smoking status: Every Day    Packs/day: .5    Types: Cigarettes   Smokeless tobacco: Never   Tobacco comments:    6-7 cigs per day  Vaping Use   Vaping Use: Never used  Substance Use Topics   Alcohol use: No   Drug use: No     Allergies  Allergen Reactions   Isosorbide Nitrate Nausea And Vomiting   Bupropion Nausea Only    Other reaction(s): Other (See Comments) "I didn't do well with it.".hallucinates   Morphine And Related Itching    Current Outpatient Medications  Medication Sig Dispense Refill   allopurinol (ZYLOPRIM) 300 MG tablet Take 300 mg by mouth daily.      amLODipine (NORVASC) 5 MG tablet Take 5 mg by mouth daily.     aspirin EC 81 MG tablet  Take 81 mg by mouth daily.     atorvastatin (LIPITOR) 20 MG tablet Take 20 mg by mouth daily.     calcitRIOL (ROCALTROL) 0.25 MCG capsule Take 0.25 mcg by mouth daily.     losartan (COZAAR) 100 MG tablet Take 100 mg by mouth daily.     metoprolol tartrate (LOPRESSOR) 50 MG tablet Take 50 mg by mouth 2 (two) times daily.     mirabegron ER (MYRBETRIQ) 50 MG TB24 tablet Take 1 tablet (50 mg total) by mouth daily. 90 tablet 3   omeprazole (PRILOSEC) 40 MG capsule Take 40 mg by mouth daily as needed (for acid reflux/indigestion.).     furosemide (LASIX) 40 MG tablet Take 40 mg by mouth daily as needed (for fluid retention (swollen ankles)---typically no more than twice a week).  (Patient not taking: Reported on 08/20/2022)     isosorbide mononitrate (IMDUR) 30 MG 24 hr tablet Take 30 mg by mouth daily. (Patient not taking: Reported on 06/24/2022)     No current facility-administered medications for this visit.      REVIEW OF SYSTEMS (Negative unless checked)  Constitutional: []Weight loss  []Fever  []Chills Cardiac: []Chest pain   []Chest pressure   []Palpitations   []Shortness of breath when laying flat   []Shortness of breath at rest   [x]Shortness of breath with exertion. Vascular:  []Pain in legs with walking   []Pain in legs at rest   []Pain in legs when laying flat   []Claudication   []Pain in feet when walking  []Pain in feet at rest  []Pain in feet when laying flat   []History of DVT   []Phlebitis   []Swelling in legs   []Varicose veins   []Non-healing ulcers Pulmonary:   []Uses home oxygen   []Productive cough   []Hemoptysis   []Wheeze  [x]COPD   []Asthma Neurologic:  []Dizziness  []Blackouts   []Seizures   []History of stroke   []History of TIA  []Aphasia   []Temporary blindness   []Dysphagia   []Weakness or numbness in arms   []Weakness or numbness in legs Musculoskeletal:  [x]Arthritis   []Joint swelling   [x]Joint pain   []Low back pain Hematologic:  []Easy bruising  []Easy bleeding    []Hypercoagulable state   [x]Anemic  []Hepatitis Gastrointestinal:  []Blood in stool   []Vomiting blood  []Gastroesophageal reflux/heartburn   []Abdominal pain Genitourinary:  [x]Chronic kidney disease   []Difficult urination  []Frequent urination  []Burning with urination   []Hematuria Skin:  []Rashes   []Ulcers   []Wounds Psychological:  []History of anxiety   [] History of major depression.      Physical Exam BP 130/76 (BP Location: Left Arm)   Pulse 64   Resp 18   Ht 5' 5.5" (1.664 m)   Wt 210 lb (95.3 kg)   BMI 34.41 kg/m  Gen:  WD/WN, NAD Head: [place]/AT, No temporalis wasting. Ear/Nose/Throat: Hearing grossly intact, nares w/o erythema or drainage, oropharynx w/o Erythema/Exudate Eyes: Conjunctiva clear, sclera non-icteric  Neck: trachea midline.  No JVD.  Pulmonary:  Good air movement, respirations not labored, no use of accessory muscles  Cardiac: RRR, no JVD Vascular: permcath in right chest without erythema or drainage. Vessel Right Left  Radial Palpable Palpable                                   Gastrointestinal:. No masses, surgical incisions, or scars. Musculoskeletal: M/S 5/5 throughout.  Extremities without ischemic changes.  No deformity or atrophy. Mild LE edema. Neurologic: Sensation grossly intact in extremities.  Symmetrical.  Speech is fluent. Motor exam as listed above. Psychiatric: Judgment intact, Mood & affect appropriate for pt's clinical situation. Dermatologic: No rashes or ulcers noted.  No cellulitis or open wounds.    Radiology VAS US UPPER EXT VEIN MAPPING (PRE-OP AVF)  Result Date: 08/12/2022 UPPER EXTREMITY VEIN MAPPING Patient Name:  Michelle Murillo  Date of Exam:   08/08/2022 Medical Rec #: [id number]           Accession #:    [id number] Date of Birth: 10/03/1945           Patient Gender: F Patient Age:   76 years Exam Location:  [facility] Procedure:      VAS US UPPER EXT VEIN MAPPING (PRE-OP AVF) Referring Phys: FALLON BROWN  --------------------------------------------------------------------------------  Indications: Pre-access. History: Esrd.  Performing Technologist: Terry Knight RVT  Examination Guidelines: A complete evaluation includes B-mode imaging, spectral Doppler, color Doppler, and power Doppler as needed of all accessible portions of each vessel. Bilateral testing is considered an integral part of a complete examination. Limited examinations for reoccurring indications may be performed as noted. +-----------------+-------------+----------+--------+ Right Cephalic   Diameter (cm)Depth (cm)Findings +-----------------+-------------+----------+--------+ Prox upper arm       0.36                        +-----------------+-------------+----------+--------+ Mid upper arm        0.36                        +-----------------+-------------+----------+--------+ Dist upper arm       0.36                        +-----------------+-------------+----------+--------+ Antecubital fossa    0.35                        +-----------------+-------------+----------+--------+ Prox forearm         0.34                        +-----------------+-------------+----------+--------+ Mid forearm          0.17                        +-----------------+-------------+----------+--------+ Dist forearm         0.19                        +-----------------+-------------+----------+--------+ +-----------------+-------------+----------+--------+   Right Basilic    Diameter (cm)Depth (cm)Findings +-----------------+-------------+----------+--------+ Prox upper arm       0.49                        +-----------------+-------------+----------+--------+ Mid upper arm        0.52                        +-----------------+-------------+----------+--------+ Dist upper arm       0.34                        +-----------------+-------------+----------+--------+ Antecubital fossa    0.37                         +-----------------+-------------+----------+--------+ Prox forearm         0.36                        +-----------------+-------------+----------+--------+ +-----------------+-------------+----------+--------+ Left Cephalic    Diameter (cm)Depth (cm)Findings +-----------------+-------------+----------+--------+ Prox upper arm       0.30                        +-----------------+-------------+----------+--------+ Mid upper arm        0.20                        +-----------------+-------------+----------+--------+ Dist upper arm       0.22                        +-----------------+-------------+----------+--------+ Antecubital fossa    0.30                        +-----------------+-------------+----------+--------+ Prox forearm         0.29                        +-----------------+-------------+----------+--------+ Mid forearm          0.26                        +-----------------+-------------+----------+--------+ Dist forearm         0.21                        +-----------------+-------------+----------+--------+ +-----------------+-------------+----------+--------+ Left Basilic     Diameter (cm)Depth (cm)Findings +-----------------+-------------+----------+--------+ Prox upper arm       0.42                        +-----------------+-------------+----------+--------+ Mid upper arm        0.30                        +-----------------+-------------+----------+--------+ Dist upper arm       0.25                        +-----------------+-------------+----------+--------+ Antecubital fossa    0.41                        +-----------------+-------------+----------+--------+ Prox forearm         0.27                        +-----------------+-------------+----------+--------+   Summary: Right: Normal caliber and compressible veins throughout. Left: Normal caliber and compressible veins throughout. *See table(s) above for measurements and  observations.  Diagnosing physician: Jiyah Torpey MD Electronically signed by Macedonio Scallon MD on 08/12/2022 at 12:51:08 PM.    Final    VAS US UPPER EXTREMITY ARTERIAL DUPLEX  Result Date: 08/12/2022  UPPER EXTREMITY DUPLEX STUDY Patient Name:  Michelle Murillo  Date of Exam:   08/08/2022 Medical Rec #: [id number]           Accession #:    [id number] Date of Birth: 02/24/1946           Patient Gender: F Patient Age:   76 years Exam Location:  [facility] Procedure:      VAS US UPPER EXTREMITY ARTERIAL DUPLEX Referring Phys: FALLON BROWN --------------------------------------------------------------------------------  Limitations: pre-access Performing Technologist: Terry Knight RVT  Examination Guidelines: A complete evaluation includes B-mode imaging, spectral Doppler, color Doppler, and power Doppler as needed of all accessible portions of each vessel. Bilateral testing is considered an integral part of a complete examination. Limited examinations for reoccurring indications may be performed as noted.  Right Pre-Dialysis Findings: +-----------------------+----------+--------------------+----------+--------+ Location               PSV (cm/s)Intralum. Diam. (cm)Waveform  Comments +-----------------------+----------+--------------------+----------+--------+ Brachial Antecub. fossa104       0.39                biphasic           +-----------------------+----------+--------------------+----------+--------+ Radial Art at Wrist    30        0.09                monophasic         +-----------------------+----------+--------------------+----------+--------+ Ulnar Art at Wrist     64        0.21                biphasic           +-----------------------+----------+--------------------+----------+--------+  Left Pre-Dialysis Findings: +-----------------------+----------+--------------------+--------+--------+ Location               PSV (cm/s)Intralum. Diam. (cm)WaveformComments  +-----------------------+----------+--------------------+--------+--------+ Brachial Antecub. fossa68        0.48                biphasic         +-----------------------+----------+--------------------+--------+--------+ Radial Art at Wrist    46        0.23                biphasic         +-----------------------+----------+--------------------+--------+--------+ Ulnar Art at Wrist     32        0.16                                 +-----------------------+----------+--------------------+--------+--------+  Summary:  Right: No obstruction visualized in the right upper extremity. Left: No obstruction visualized in the left upper extremity. *See table(s) above for measurements and observations. Electronically signed by Aniyiah Zell MD on 08/12/2022 at 12:51:00 PM.    Final     Labs Recent Results (from the past 2160 hour(s))  Potassium ([facility] vascular lab only)     Status: None   Collection Time: 06/24/22  9:05 AM  Result Value Ref Range   Potassium ([facility] vascular lab) 4.7 3.5 - 5.1 mmol/L    Comment: Performed at [facility], [address] [place], [place] [address]    Assessment/Plan:  ESRD on dialysis (HCC) The patient has adequate cephalic vein for fistula creation on either arm.  We would generally go to the nondominant (left for her) arm first.  I have discussed the creation of AV fistula in terms of the procedure and what the expected course would be.  I discussed a 6 to 8-week maturation phase before the fistula could be used.  I discussed the reasons that AV fistulas are generally preferred over catheter dependent dialysis.  I also discussed the difference between AV fistula and AV grafts.  She initiated discussion about peritoneal dialysis as well today.  She asks if she is a candidate for this.  Ultimately, that would be up to her nephrologist, and I am no longer doing the peritoneal dialysis catheter so that would be a different surgeon performing this if she  chooses peritoneal dialysis.  I am happy to have an AV fistula creation scheduled for the near future at her convenience if she would like to proceed.  She wants to go home and consider her options and let us know.  Diabetes mellitus without complication (HCC) blood glucose control important in reducing the progression of atherosclerotic disease. Also, involved in wound healing. On appropriate medications.   Hypertension blood pressure control important in reducing the progression of atherosclerotic disease. On appropriate oral medications.   AAA (abdominal aortic aneurysm) without rupture (HCC) Has not been checked in several years.  Can do a duplex at her convenience.       Ronnisha Felber 08/20/2022, 10:33 AM   This note was created with Dragon medical transcription system.  Any errors from dictation are unintentional.   

## 2022-08-20 NOTE — Assessment & Plan Note (Signed)
blood pressure control important in reducing the progression of atherosclerotic disease. On appropriate oral medications.  

## 2022-08-20 NOTE — Progress Notes (Signed)
Patient ID: Michelle Murillo, female   DOB: 1946-01-14, 77 y.o.   MRN: [id number]  Chief Complaint  Patient presents with   New Patient (Initial Visit)    np. ue art dup + ue v-map + consult. evaluate for new access. referred by lateef, munsoor      HPI Michelle Murillo is a 77 y.o. female.  I am asked to see the patient by Dr. Holley Raring for evaluation of AVF creation.  We placed a PermCath for new dialysis access earlier this year which has been [place].  Her nephrologist has discussed with her options for dialysis that have included peritoneal dialysis and hemodialysis and she recently had a vein mapping which showed what appeared to be adequate cephalic vein on either arm for fistula creation.  She is right arm dominant.  She has not really had significant improvement in her lethargy and overall symptomatology since the initiation of dialysis.  She is very hesitant to have a fistula created.     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 now functions at [id number]   Complication of anesthesia 2013   "stops breathing and goes into convulsions".  pt was in a coma for 18 days after this.(during CABG)   Complication of anesthesia    dr. Doris Cheadle is attending provider. surgery was at [facility]   COPD (chronic obstructive pulmonary disease) ([place])    Coronary artery disease    Diabetes mellitus without complication (HCC)    borderline, does not take any medication   GERD (gastroesophageal reflux disease)    Gout    Hypertension    Legally blind    unable to see any more than shapes   Seizures ([place]) 2013   only happened during cabg procedure while on the table    Past Surgical History:  Procedure Laterality Date   ABDOMINAL AORTOGRAM N/A 06/23/2017   Procedure: ABDOMINAL AORTOGRAM;   Surgeon: Algernon Huxley, MD;  Location: [facility];  Service: Cardiovascular;  Laterality: N/A;   ABDOMINAL SURGERY  2017   aneurysm repair with dr. Keniya Schlotterbeck   BRONCHOSCOPY  2015   done at [place]. carcinoid of airways   BYPASS GRAFT  2013   cabg x 4   CARDIAC SURGERY  2013   cabg   CHOLECYSTECTOMY     age 65   CORONARY ARTERY BYPASS GRAFT     DIALYSIS/PERMA CATHETER INSERTION N/A 06/24/2022   Procedure: DIALYSIS/PERMA CATHETER INSERTION;  Surgeon: Algernon Huxley, MD;  Location: [facility];  Service: Cardiovascular;  Laterality: N/A;   ENDOVASCULAR REPAIR/STENT GRAFT N/A 07/06/2018   Procedure: ENDOVASCULAR REPAIR/STENT GRAFT;  Surgeon: Algernon Huxley, MD;  Location: [facility];  Service: Cardiovascular;  Laterality: N/A;   laryngeal nodule surgery     LEFT HEART CATH AND CORS/GRAFTS ANGIOGRAPHY Left 03/07/2020   Procedure: LEFT HEART CATH AND CORS/GRAFTS ANGIOGRAPHY;  Surgeon: Corey Skains, MD;  Location: [facility];  Service: Cardiovascular;  Laterality: Left;     Family History  Problem Relation Age of Onset   Congestive Heart Failure Mother    Heart attack Mother    Cancer Father    Liver cancer Father    Cancer Paternal Grandfather  Social History   Tobacco Use   Smoking status: Every Day    Packs/day: .5    Types: Cigarettes   Smokeless tobacco: Never   Tobacco comments:    6-7 cigs per day  Vaping Use   Vaping Use: Never used  Substance Use Topics   Alcohol use: No   Drug use: No     Allergies  Allergen Reactions   Isosorbide Nitrate Nausea And Vomiting   Bupropion Nausea Only    Other reaction(s): Other (See Comments) "I didn't do well with it.".hallucinates   Morphine And Related Itching    Current Outpatient Medications  Medication Sig Dispense Refill   allopurinol (ZYLOPRIM) 300 MG tablet Take 300 mg by mouth daily.      amLODipine (NORVASC) 5 MG tablet Take 5 mg by mouth daily.     aspirin EC 81 MG tablet  Take 81 mg by mouth daily.     atorvastatin (LIPITOR) 20 MG tablet Take 20 mg by mouth daily.     calcitRIOL (ROCALTROL) 0.25 MCG capsule Take 0.25 mcg by mouth daily.     losartan (COZAAR) 100 MG tablet Take 100 mg by mouth daily.     metoprolol tartrate (LOPRESSOR) 50 MG tablet Take 50 mg by mouth 2 (two) times daily.     mirabegron ER (MYRBETRIQ) 50 MG TB24 tablet Take 1 tablet (50 mg total) by mouth daily. 90 tablet 3   omeprazole (PRILOSEC) 40 MG capsule Take 40 mg by mouth daily as needed (for acid reflux/indigestion.).     furosemide (LASIX) 40 MG tablet Take 40 mg by mouth daily as needed (for fluid retention (swollen ankles)---typically no more than twice a week).  (Patient not taking: Reported on 08/20/2022)     isosorbide mononitrate (IMDUR) 30 MG 24 hr tablet Take 30 mg by mouth daily. (Patient not taking: Reported on 06/24/2022)     No current facility-administered medications for this visit.      REVIEW OF SYSTEMS (Negative unless checked)  Constitutional: [] Weight loss  [] Fever  [] Chills Cardiac: [] Chest pain   [] Chest pressure   [] Palpitations   [] Shortness of breath when laying flat   [] Shortness of breath at rest   [x] Shortness of breath with exertion. Vascular:  [] Pain in legs with walking   [] Pain in legs at rest   [] Pain in legs when laying flat   [] Claudication   [] Pain in feet when walking  [] Pain in feet at rest  [] Pain in feet when laying flat   [] History of DVT   [] Phlebitis   [] Swelling in legs   [] Varicose veins   [] Non-healing ulcers Pulmonary:   [] Uses home oxygen   [] Productive cough   [] Hemoptysis   [] Wheeze  [x] COPD   [] Asthma Neurologic:  [] Dizziness  [] Blackouts   [] Seizures   [] History of stroke   [] History of TIA  [] Aphasia   [] Temporary blindness   [] Dysphagia   [] Weakness or numbness in arms   [] Weakness or numbness in legs Musculoskeletal:  [x] Arthritis   [] Joint swelling   [x] Joint pain   [] Low back pain Hematologic:  [] Easy bruising  [] Easy bleeding    [] Hypercoagulable state   [x] Anemic  [] Hepatitis Gastrointestinal:  [] Blood in stool   [] Vomiting blood  [] Gastroesophageal reflux/heartburn   [] Abdominal pain Genitourinary:  [x] Chronic kidney disease   [] Difficult urination  [] Frequent urination  [] Burning with urination   [] Hematuria Skin:  [] Rashes   [] Ulcers   [] Wounds Psychological:  [] History of anxiety   []  History of major depression.  Physical Exam BP 130/76 (BP Location: Left Arm)   Pulse 64   Resp 18   Ht 5' 5.5" (1.664 m)   Wt 210 lb (95.3 kg)   BMI 34.41 kg/m  Gen:  WD/WN, NAD Head: [place]/AT, No temporalis wasting. Ear/Nose/Throat: Hearing grossly intact, nares w/o erythema or drainage, oropharynx w/o Erythema/Exudate Eyes: Conjunctiva clear, sclera non-icteric  Neck: trachea midline.  No JVD.  Pulmonary:  Good air movement, respirations not labored, no use of accessory muscles  Cardiac: RRR, no JVD Vascular: permcath in right chest without erythema or drainage. Vessel Right Left  Radial Palpable Palpable                                   Gastrointestinal:. No masses, surgical incisions, or scars. Musculoskeletal: M/S 5/5 throughout.  Extremities without ischemic changes.  No deformity or atrophy. Mild LE edema. Neurologic: Sensation grossly intact in extremities.  Symmetrical.  Speech is fluent. Motor exam as listed above. Psychiatric: Judgment intact, Mood & affect appropriate for pt's clinical situation. Dermatologic: No rashes or ulcers noted.  No cellulitis or open wounds.    Radiology VAS [place] UPPER EXT VEIN MAPPING (PRE-OP AVF)  Result Date: 08/12/2022 UPPER EXTREMITY VEIN MAPPING Patient Name:  DALARI NEUMANN  Date of Exam:   08/08/2022 Medical Rec #: [id number]           Accession #:    [id number] Date of Birth: April 21, 1946           Patient Gender: F Patient Age:   47 years Exam Location:  [facility] Procedure:      VAS [place] UPPER EXT VEIN MAPPING (PRE-OP AVF) Referring Phys: Eulogio Ditch  --------------------------------------------------------------------------------  Indications: Pre-access. History: Esrd.  Performing Technologist: Concha Norway RVT  Examination Guidelines: A complete evaluation includes B-mode imaging, spectral Doppler, color Doppler, and power Doppler as needed of all accessible portions of each vessel. Bilateral testing is considered an integral part of a complete examination. Limited examinations for reoccurring indications may be performed as noted. +-----------------+-------------+----------+--------+ Right Cephalic   Diameter (cm)Depth (cm)Findings +-----------------+-------------+----------+--------+ Prox upper arm       0.36                        +-----------------+-------------+----------+--------+ Mid upper arm        0.36                        +-----------------+-------------+----------+--------+ Dist upper arm       0.36                        +-----------------+-------------+----------+--------+ Antecubital fossa    0.35                        +-----------------+-------------+----------+--------+ Prox forearm         0.34                        +-----------------+-------------+----------+--------+ Mid forearm          0.17                        +-----------------+-------------+----------+--------+ Dist forearm         0.19                        +-----------------+-------------+----------+--------+ +-----------------+-------------+----------+--------+  Right Basilic    Diameter (cm)Depth (cm)Findings +-----------------+-------------+----------+--------+ Prox upper arm       0.49                        +-----------------+-------------+----------+--------+ Mid upper arm        0.52                        +-----------------+-------------+----------+--------+ Dist upper arm       0.34                        +-----------------+-------------+----------+--------+ Antecubital fossa    0.37                         +-----------------+-------------+----------+--------+ Prox forearm         0.36                        +-----------------+-------------+----------+--------+ +-----------------+-------------+----------+--------+ Left Cephalic    Diameter (cm)Depth (cm)Findings +-----------------+-------------+----------+--------+ Prox upper arm       0.30                        +-----------------+-------------+----------+--------+ Mid upper arm        0.20                        +-----------------+-------------+----------+--------+ Dist upper arm       0.22                        +-----------------+-------------+----------+--------+ Antecubital fossa    0.30                        +-----------------+-------------+----------+--------+ Prox forearm         0.29                        +-----------------+-------------+----------+--------+ Mid forearm          0.26                        +-----------------+-------------+----------+--------+ Dist forearm         0.21                        +-----------------+-------------+----------+--------+ +-----------------+-------------+----------+--------+ Left Basilic     Diameter (cm)Depth (cm)Findings +-----------------+-------------+----------+--------+ Prox upper arm       0.42                        +-----------------+-------------+----------+--------+ Mid upper arm        0.30                        +-----------------+-------------+----------+--------+ Dist upper arm       0.25                        +-----------------+-------------+----------+--------+ Antecubital fossa    0.41                        +-----------------+-------------+----------+--------+ Prox forearm         0.27                        +-----------------+-------------+----------+--------+  Summary: Right: Normal caliber and compressible veins throughout. Left: Normal caliber and compressible veins throughout. *See table(s) above for measurements and  observations.  Diagnosing physician: Leotis Pain MD Electronically signed by Leotis Pain MD on 08/12/2022 at 12:51:08 PM.    Final    VAS [place] UPPER EXTREMITY ARTERIAL DUPLEX  Result Date: 08/12/2022  UPPER EXTREMITY DUPLEX STUDY Patient Name:  DEANNAH LUMPKINS  Date of Exam:   08/08/2022 Medical Rec #: [id number]           Accession #:    [id number] Date of Birth: 05/06/1946           Patient Gender: F Patient Age:   2 years Exam Location:  [facility] Procedure:      VAS [place] UPPER EXTREMITY ARTERIAL DUPLEX Referring Phys: Eulogio Ditch --------------------------------------------------------------------------------  Limitations: pre-access Performing Technologist: Concha Norway RVT  Examination Guidelines: A complete evaluation includes B-mode imaging, spectral Doppler, color Doppler, and power Doppler as needed of all accessible portions of each vessel. Bilateral testing is considered an integral part of a complete examination. Limited examinations for reoccurring indications may be performed as noted.  Right Pre-Dialysis Findings: +-----------------------+----------+--------------------+----------+--------+ Location               PSV (cm/s)Intralum. Diam. (cm)Waveform  Comments +-----------------------+----------+--------------------+----------+--------+ Brachial Antecub. fossa104       0.39                biphasic           +-----------------------+----------+--------------------+----------+--------+ Radial Art at Wrist    30        0.09                monophasic         +-----------------------+----------+--------------------+----------+--------+ Ulnar Art at Wrist     64        0.21                biphasic           +-----------------------+----------+--------------------+----------+--------+  Left Pre-Dialysis Findings: +-----------------------+----------+--------------------+--------+--------+ Location               PSV (cm/s)Intralum. Diam. (cm)WaveformComments  +-----------------------+----------+--------------------+--------+--------+ Brachial Antecub. fossa68        0.48                biphasic         +-----------------------+----------+--------------------+--------+--------+ Radial Art at Wrist    46        0.23                biphasic         +-----------------------+----------+--------------------+--------+--------+ Ulnar Art at Wrist     32        0.16                                 +-----------------------+----------+--------------------+--------+--------+  Summary:  Right: No obstruction visualized in the right upper extremity. Left: No obstruction visualized in the left upper extremity. *See table(s) above for measurements and observations. Electronically signed by Leotis Pain MD on 08/12/2022 at 12:51:00 PM.    Final     Labs Recent Results (from the past 2160 hour(s))  Potassium [facility] vascular lab only)     Status: None   Collection Time: 06/24/22  9:05 AM  Result Value Ref Range   Potassium [facility] vascular lab) 4.7 3.5 - 5.1 mmol/L    Comment: Performed at [facility], [address] [place], [place] [address]    Assessment/Plan:  ESRD on dialysis [facility]) The patient has adequate cephalic vein for fistula creation on either arm.  We would generally go to the nondominant (left for her) arm first.  I have discussed the creation of AV fistula in terms of the procedure and what the expected course would be.  I discussed a 6 to 8-week maturation phase before the fistula could be used.  I discussed the reasons that AV fistulas are generally preferred over catheter dependent dialysis.  I also discussed the difference between AV fistula and AV grafts.  She initiated discussion about peritoneal dialysis as well today.  She asks if she is a candidate for this.  Ultimately, that would be up to her nephrologist, and I am no longer doing the peritoneal dialysis catheter so that would be a different surgeon performing this if she  chooses peritoneal dialysis.  I am happy to have an AV fistula creation scheduled for the near future at her convenience if she would like to proceed.  She wants to go home and consider her options and let us know.  Diabetes mellitus without complication (HCC) blood glucose control important in reducing the progression of atherosclerotic disease. Also, involved in wound healing. On appropriate medications.   Hypertension blood pressure control important in reducing the progression of atherosclerotic disease. On appropriate oral medications.   AAA (abdominal aortic aneurysm) without rupture ([place]) Has not been checked in several years.  Can do a duplex at her convenience.       Leotis Pain 08/20/2022, 10:33 AM   This note was created with Dragon medical transcription system.  Any errors from dictation are unintentional.

## 2022-08-20 NOTE — Assessment & Plan Note (Signed)
Has not been checked in several years.  Can do a duplex at her convenience.

## 2022-08-20 NOTE — Assessment & Plan Note (Signed)
blood glucose control important in reducing the progression of atherosclerotic disease. Also, involved in wound healing. On appropriate medications.  

## 2022-09-02 ENCOUNTER — Telehealth (INDEPENDENT_AMBULATORY_CARE_PROVIDER_SITE_OTHER): Payer: Self-pay

## 2022-09-02 NOTE — Telephone Encounter (Signed)
Spoke with the patient and she is scheduled with Dr. Gilda Crease on 09/03/22 for a urgent permcath exchange at the [facility] with a 2:30 pm arrival time. Pre-procedure instructions were discussed and will be sent to Mychart. Patient stated she understood.

## 2022-09-03 ENCOUNTER — Encounter: Payer: Self-pay | Admitting: Vascular Surgery

## 2022-09-03 ENCOUNTER — Other Ambulatory Visit: Payer: Self-pay

## 2022-09-03 ENCOUNTER — Ambulatory Visit
Admission: RE | Admit: 2022-09-03 | Discharge: 2022-09-03 | Disposition: A | Discharge status: Home / Self Care | Payer: Medicare HMO | Attending: Vascular Surgery | Admitting: Vascular Surgery

## 2022-09-03 ENCOUNTER — Encounter
Admission: RE | Disposition: A | Discharge status: Home / Self Care | Payer: Self-pay | Source: Home / Self Care | Attending: Vascular Surgery

## 2022-09-03 DIAGNOSIS — F17210 Nicotine dependence, cigarettes, uncomplicated: Secondary | ICD-10-CM | POA: Diagnosis not present

## 2022-09-03 DIAGNOSIS — I509 Heart failure, unspecified: Secondary | ICD-10-CM | POA: Diagnosis not present

## 2022-09-03 DIAGNOSIS — T8249XA Other complication of vascular dialysis catheter, initial encounter: Secondary | ICD-10-CM | POA: Diagnosis not present

## 2022-09-03 DIAGNOSIS — N186 End stage renal disease: Secondary | ICD-10-CM | POA: Insufficient documentation

## 2022-09-03 DIAGNOSIS — T8241XA Breakdown (mechanical) of vascular dialysis catheter, initial encounter: Secondary | ICD-10-CM | POA: Diagnosis present

## 2022-09-03 DIAGNOSIS — I132 Hypertensive heart and chronic kidney disease with heart failure and with stage 5 chronic kidney disease, or end stage renal disease: Secondary | ICD-10-CM | POA: Diagnosis not present

## 2022-09-03 DIAGNOSIS — E1122 Type 2 diabetes mellitus with diabetic chronic kidney disease: Secondary | ICD-10-CM | POA: Insufficient documentation

## 2022-09-03 DIAGNOSIS — Z992 Dependence on renal dialysis: Secondary | ICD-10-CM | POA: Diagnosis not present

## 2022-09-03 DIAGNOSIS — I714 Abdominal aortic aneurysm, without rupture, unspecified: Secondary | ICD-10-CM | POA: Insufficient documentation

## 2022-09-03 DIAGNOSIS — Y841 Kidney dialysis as the cause of abnormal reaction of the patient, or of later complication, without mention of misadventure at the time of the procedure: Secondary | ICD-10-CM | POA: Insufficient documentation

## 2022-09-03 HISTORY — PX: DIALYSIS/PERMA CATHETER INSERTION: CATH118288

## 2022-09-03 LAB — POTASSIUM (ARMC VASCULAR LAB ONLY): Potassium (ARMC vascular lab): 4.6 mmol/L (ref 3.5–5.1)

## 2022-09-03 SURGERY — DIALYSIS/PERMA CATHETER INSERTION
Anesthesia: Moderate Sedation

## 2022-09-03 MED ORDER — FENTANYL CITRATE (PF) 100 MCG/2ML IJ SOLN
INTRAMUSCULAR | Status: AC
  Filled 2022-09-03: qty 2

## 2022-09-03 MED ORDER — CEFAZOLIN SODIUM-DEXTROSE 1-4 GM/50ML-% IV SOLN
INTRAVENOUS | Status: AC
  Filled 2022-09-03: qty 50

## 2022-09-03 MED ORDER — MIDAZOLAM HCL 2 MG/ML PO SYRP: 8.0000 mg | ORAL_SOLUTION | Freq: Once | ORAL | Status: DC | PRN

## 2022-09-03 MED ORDER — FENTANYL CITRATE (PF) 100 MCG/2ML IJ SOLN
INTRAMUSCULAR | Status: DC | PRN
  Administered 2022-09-03 (×2): 12.5 ug via INTRAVENOUS
  Administered 2022-09-03: 50 ug via INTRAVENOUS

## 2022-09-03 MED ORDER — FAMOTIDINE 20 MG PO TABS: 40.0000 mg | ORAL_TABLET | Freq: Once | ORAL | Status: DC | PRN

## 2022-09-03 MED ORDER — METHYLPREDNISOLONE SODIUM SUCC 125 MG IJ SOLR: 125.0000 mg | Freq: Once | INTRAMUSCULAR | Status: DC | PRN

## 2022-09-03 MED ORDER — DIPHENHYDRAMINE HCL 50 MG/ML IJ SOLN: 50.0000 mg | Freq: Once | INTRAMUSCULAR | Status: DC | PRN

## 2022-09-03 MED ORDER — MIDAZOLAM HCL 2 MG/2ML IJ SOLN
INTRAMUSCULAR | Status: AC
  Filled 2022-09-03: qty 2

## 2022-09-03 MED ORDER — CEFAZOLIN SODIUM-DEXTROSE 1-4 GM/50ML-% IV SOLN
1.0000 g | INTRAVENOUS | Status: AC
  Administered 2022-09-03: 1 g via INTRAVENOUS

## 2022-09-03 MED ORDER — HEPARIN SODIUM (PORCINE) 10000 UNIT/ML IJ SOLN
INTRAMUSCULAR | Status: AC
  Filled 2022-09-03: qty 1

## 2022-09-03 MED ORDER — MIDAZOLAM HCL 2 MG/2ML IJ SOLN
INTRAMUSCULAR | Status: DC | PRN
  Administered 2022-09-03: .5 mg via INTRAVENOUS
  Administered 2022-09-03: 2 mg via INTRAVENOUS
  Administered 2022-09-03: .5 mg via INTRAVENOUS

## 2022-09-03 MED ORDER — SODIUM CHLORIDE 0.9 % IV SOLN: INTRAVENOUS | Status: DC

## 2022-09-03 MED ORDER — ONDANSETRON HCL 4 MG/2ML IJ SOLN: 4.0000 mg | Freq: Four times a day (QID) | INTRAMUSCULAR | Status: DC | PRN

## 2022-09-03 MED ORDER — HYDROMORPHONE HCL 1 MG/ML IJ SOLN: 1.0000 mg | Freq: Once | INTRAMUSCULAR | Status: DC | PRN

## 2022-09-03 SURGICAL SUPPLY — 5 items
CATH PALIN MAXID VT KIT 19CM (CATHETERS) IMPLANT
GUIDEWIRE SUPER STIFF .035X180 (WIRE) IMPLANT
PACK ANGIOGRAPHY (CUSTOM PROCEDURE TRAY) IMPLANT
SUT MNCRL AB 4-0 PS2 18 (SUTURE) IMPLANT
SUT SILK 0 FSL (SUTURE) IMPLANT

## 2022-09-03 NOTE — Interval H&P Note (Signed)
History and Physical Interval Note:  09/03/2022 4:30 PM  Michelle Murillo  has presented today for surgery, with the diagnosis of Perma Cath Exchange   End Stage Renal.  The various methods of treatment have been discussed with the patient and family. After consideration of risks, benefits and other options for treatment, the patient has consented to  Procedure(s): DIALYSIS/PERMA CATHETER INSERTION (N/A) as a surgical intervention.  The patient's history has been reviewed, patient examined, no change in status, stable for surgery.  I have reviewed the patient's chart and labs.  Questions were answered to the patient's satisfaction.     Levora Dredge

## 2022-09-03 NOTE — Discharge Instructions (Signed)
Tunneled Catheter Insertion, Care After The following information offers guidance on how to care for yourself after your procedure. Your health care provider may also give you more specific instructions. If you have problems or questions, contact your health care provider. What can I expect after the procedure? After the procedure, it is common to have: Some mild redness, bruising, swelling, and pain around your catheter site. A small amount of blood or clear fluid coming from your incisions. Follow these instructions at home: Medicines Take over-the-counter and prescription medicines only as told by your health care provider. If you were prescribed an antibiotic medicine, take it as told by your health care provider. Do not stop taking the antibiotic even if you start to feel better. Incision care  Follow instructions from your health care provider about how to take care of your incisions. Make sure you: Your dialysis center will perform all needed dressing changes.  Leave stitches (sutures), skin glue, or adhesive strips in place. These skin closures may need to stay in place for 2 weeks or longer. Keep your dressings clean and dry. Check your incision areas every day for signs of infection. Check for: More redness, swelling, or pain. More fluid or blood. Warmth. Pus or a bad smell. Catheter care  Keep your catheter site clean and dry. Do not shower, sponge bath only while catheter in place.  Your dialysis center will Flush your catheter per the dialysis center protocol.  This helps prevent it from becoming clogged. Leave thecaps on the ends of the catheter when not in use. Do not pull on your catheter. Activity Return to your normal activities as told by your health care provider. Ask your health care provider what activities are safe for you. Follow any other activity restrictions as instructed by your health care provider. Do not lift anything that is heavier than 10 lb (4.5 kg), or  the limit that you are told, until your health care provider says that it is safe. Driving Do not drive for 24 hours.  General instructions Follow your health care provider's specific instructions for the type of catheter that you have. Do not take baths, swim, or use a hot tub while your catheter is in place. Keep all follow-up visits. This is important to prevent infection.  Contact a health care provider if: You feel unusually weak or nauseous. You have a fever or chills. You have more redness, swelling, or pain at your incisions or around the area where your catheter has been inserted or where it exits. You have pus or a bad smell coming from your catheter site. Your catheter site feels warm to the touch. Your catheter is not working properly. Fluid is leaking from the catheter, under the dressing, or around the dressing. Your dialysis center is unable to flush your catheter. Get help right away if: Your catheter develops a hole or it breaks. Your catheter comes loose or gets pulled completely out. If this happens, press on your catheter site firmly with a clean cloth until you can get medical help. You develop bleeding from your catheter or your insertion site, and your bleeding does not stop. You have swelling in your shoulder, neck, chest, or face. You have pain or swelling when fluids or medicines are being given through the catheter. You have chest pain or difficulty breathing. These symptoms may represent a serious problem that is an emergency. Do not wait to see if the symptoms will go away. Get medical help right away. Call your   local emergency services (911 in the [place] Do not drive yourself to the hospital. Summary After the procedure, it is common to have mild redness, swelling, and pain around your catheter site. Return to your normal activities as told by your health care provider. Ask your health care provider what activities are safe for you. Follow your health care  provider's specific instructions for the type of catheter that you have. Keep your catheter site and your dressings clean and dry. Contact a health care provider if your catheter is not working properly. Get help right away if you have chest pain, difficulty breathing, or your catheter comes loose or gets pulled completely out. This information is not intended to replace advice given to you by your health care provider. Make sure you discuss any questions you have with your health care provider. Document Revised: 09/18/2020 Document Reviewed: 09/18/2020 Elsevier Patient Education  2023 Elsevier Inc[employer]     

## 2022-09-03 NOTE — Op Note (Signed)
OPERATIVE NOTE   PROCEDURE: Insertion of non-tunneled dialysis catheter right IJ approach same venous access.  PRE-OPERATIVE DIAGNOSIS: Nonfunctioning dialysis catheter; end-stage renal disease requiring hemodialysis  POST-OPERATIVE DIAGNOSIS: Same  SURGEON: Renford Dills  ESTIMATED BLOOD LOSS: Minimal cc  CONTRAST USED:  None  INDICATIONS:   Michelle Murillo 77 y.o. female who presents with a nonfunctioning catheter she did not achieve adequate dialysis yesterday.  Adequate dialysis has not been possible.  She is therefore undergoing exchange for a new functioning catheter  DESCRIPTION: After obtaining full informed written consent, the patient was positioned supine. The right neck and chest wall was prepped and draped in a sterile fashion.  The existing catheter is then transected proximal to the cuff.  The guidewire is advanced without difficulty.  The new catheter is fed into the central venous system over the wire without difficulty.  All lumens aspirate and flush well.  Catheter secured to the skin of the right chest wall with 2-0 nylon. A sterile dressing is applied with a Biopatch.  COMPLICATIONS: None  CONDITION: Michelle Murillo [facility] Office:  [phone]  09/03/2022, 5:34 PM

## 2022-09-30 ENCOUNTER — Other Ambulatory Visit: Payer: Self-pay

## 2022-09-30 ENCOUNTER — Emergency Department
Admission: EM | Admit: 2022-09-30 | Discharge: 2022-09-30 | Disposition: A | Discharge status: Home / Self Care | Payer: Medicare HMO | Attending: Emergency Medicine | Admitting: Emergency Medicine

## 2022-09-30 DIAGNOSIS — T82838A Hemorrhage of vascular prosthetic devices, implants and grafts, initial encounter: Secondary | ICD-10-CM | POA: Insufficient documentation

## 2022-09-30 DIAGNOSIS — Z992 Dependence on renal dialysis: Secondary | ICD-10-CM | POA: Diagnosis not present

## 2022-09-30 DIAGNOSIS — N186 End stage renal disease: Secondary | ICD-10-CM | POA: Diagnosis not present

## 2022-09-30 DIAGNOSIS — T829XXA Unspecified complication of cardiac and vascular prosthetic device, implant and graft, initial encounter: Secondary | ICD-10-CM

## 2022-09-30 NOTE — ED Provider Notes (Signed)
[facility] Provider Note    Event Date/Time   First MD Initiated Contact with Patient 09/30/22 1707     (approximate)   History   medical device problem   HPI  Michelle Murillo is a 77 y.o. female with a history of ESRD on dialysis who presents due to a problem with her dialysis catheter in her right chest.  The patient states that she completed dialysis today when the wound suddenly was noted to be bleeding a large amount.  This subsided by the time she got to the ED.  There is no longer any bleeding.  The patient denies any acute pain.  She has had no complications with this catheter which was placed about a month ago.  I reviewed the past medical records; I confirmed that the patient had insertion of a right IJ nontunneled catheter on 4/9 by Dr. Gilda Crease which was uncomplicated.   Physical Exam   Triage Vital Signs: ED Triage Vitals  Enc Vitals Group     BP 09/30/22 1532 (!) 140/74     Pulse Rate 09/30/22 1532 66     Resp 09/30/22 1532 16     Temp 09/30/22 1532 98.7 F (37.1 C)     Temp Source 09/30/22 1532 Oral     SpO2 09/30/22 1532 96 %     Weight 09/30/22 1533 204 lb (92.5 kg)     Height 09/30/22 1533 5' 5.5" (1.664 m)     Head Circumference --      Peak Flow --      Pain Score 09/30/22 1533 0     Pain Loc --      Pain Edu? --      Excl. in GC? --     Most recent vital signs: Vitals:   09/30/22 1532  BP: (!) 140/74  Pulse: 66  Resp: 16  Temp: 98.7 F (37.1 C)  SpO2: 96%     General: Awake, no distress.  CV:  Good peripheral perfusion.  Resp:  Normal effort.  Abd:  No distention.  Other:  Right chest wall dialysis catheter in place.  Dried blood adjacent to the wound.  No bleeding.  No tenderness.  No palpable hematoma.  No cracks or visible defects in the tubing.   ED Results / Procedures / Treatments   Labs (all labs ordered are listed, but only abnormal results are displayed) Labs Reviewed - No data to  display   EKG   RADIOLOGY   PROCEDURES:  Critical Care performed: No  Procedures   MEDICATIONS ORDERED IN ED: Medications - No data to display   IMPRESSION / MDM / ASSESSMENT AND PLAN / ED COURSE  I reviewed the triage vital signs and the nursing notes.  Differential diagnosis includes, but is not limited to, catheter malfunction, postoperative bleeding.  Patient's presentation is most consistent with acute presentation with potential threat to life or bodily function.  The patient has now been in the ED for few hours and there has been no recurrence of the bleeding.  I removed the dressing from the catheter and verified that there is no blood coming out.  I contacted Dr. Gilda Crease and discussed the case with him.  He recommends applying a pressure dressing and advises that he has no other acute recommendations.  He advises that the patient is stable for discharge and can proceed with her dialysis normally.  I counseled the patient on the plan of care.  I gave her strict  return precautions and she expressed understanding.   FINAL CLINICAL IMPRESSION(S) / ED DIAGNOSES   Final diagnoses:  Complication associated with dialysis catheter     Rx / DC Orders   ED Discharge Orders     None        Note:  This document was prepared using Dragon voice recognition software and may include unintentional dictation errors.    Dionne Bucy, MD 09/30/22 2022

## 2022-09-30 NOTE — ED Triage Notes (Signed)
C/O bleeding around dialysis catheter today after dialysis.  Catheter placed approximately 1.5 months ago.  No bleeding seen at this time.  Patient had full dialysis treatment today.

## 2022-09-30 NOTE — Discharge Instructions (Signed)
At this time there are no signs of any continued bleeding from the wound or catheter.  Follow up for your next dialysis session as planned.  Return to the ER for new, worsening, or recurrent bleeding, pain, nonfunctioning catheter, or any other new or worsening symptoms that concern you.

## 2022-10-02 ENCOUNTER — Telehealth (INDEPENDENT_AMBULATORY_CARE_PROVIDER_SITE_OTHER): Payer: Self-pay

## 2022-10-02 NOTE — Telephone Encounter (Signed)
I received an order from [facility] for the patient to have a permcath exchange. I attempted to contact the patient and a message was left for a return call.

## 2022-10-02 NOTE — Telephone Encounter (Signed)
Spoke with the patient and she is scheduled with Dr. Wyn Quaker on 10/03/22 with a 12:00 pm for a permcath exchange at [facility]. Pre-procedure instructions were discussed  and patient stated she understood. This will be sent to Mychart.

## 2022-10-03 ENCOUNTER — Encounter
Admission: RE | Disposition: A | Discharge status: Home / Self Care | Payer: Self-pay | Source: Home / Self Care | Attending: Vascular Surgery

## 2022-10-03 ENCOUNTER — Encounter: Payer: Self-pay | Admitting: Vascular Surgery

## 2022-10-03 ENCOUNTER — Ambulatory Visit
Admission: RE | Admit: 2022-10-03 | Discharge: 2022-10-03 | Disposition: A | Discharge status: Home / Self Care | Payer: Medicare HMO | Attending: Vascular Surgery | Admitting: Vascular Surgery

## 2022-10-03 DIAGNOSIS — Z992 Dependence on renal dialysis: Secondary | ICD-10-CM | POA: Insufficient documentation

## 2022-10-03 DIAGNOSIS — F17210 Nicotine dependence, cigarettes, uncomplicated: Secondary | ICD-10-CM | POA: Diagnosis not present

## 2022-10-03 DIAGNOSIS — I120 Hypertensive chronic kidney disease with stage 5 chronic kidney disease or end stage renal disease: Secondary | ICD-10-CM | POA: Insufficient documentation

## 2022-10-03 DIAGNOSIS — N186 End stage renal disease: Secondary | ICD-10-CM | POA: Diagnosis not present

## 2022-10-03 DIAGNOSIS — E1122 Type 2 diabetes mellitus with diabetic chronic kidney disease: Secondary | ICD-10-CM | POA: Insufficient documentation

## 2022-10-03 DIAGNOSIS — Z452 Encounter for adjustment and management of vascular access device: Secondary | ICD-10-CM | POA: Diagnosis not present

## 2022-10-03 DIAGNOSIS — I2510 Atherosclerotic heart disease of native coronary artery without angina pectoris: Secondary | ICD-10-CM | POA: Diagnosis not present

## 2022-10-03 HISTORY — PX: DIALYSIS/PERMA CATHETER INSERTION: CATH118288

## 2022-10-03 SURGERY — DIALYSIS/PERMA CATHETER INSERTION
Anesthesia: Moderate Sedation

## 2022-10-03 MED ORDER — METHYLPREDNISOLONE SODIUM SUCC 125 MG IJ SOLR: 125.0000 mg | Freq: Once | INTRAMUSCULAR | Status: DC | PRN

## 2022-10-03 MED ORDER — SODIUM CHLORIDE 0.9 % IV SOLN
INTRAVENOUS | Status: DC
  Administered 2022-10-03: 1000 mL via INTRAVENOUS

## 2022-10-03 MED ORDER — MIDAZOLAM HCL 2 MG/2ML IJ SOLN
INTRAMUSCULAR | Status: AC
  Filled 2022-10-03: qty 2

## 2022-10-03 MED ORDER — FENTANYL CITRATE (PF) 100 MCG/2ML IJ SOLN
INTRAMUSCULAR | Status: DC | PRN
  Administered 2022-10-03: 50 ug via INTRAVENOUS

## 2022-10-03 MED ORDER — CEFAZOLIN SODIUM-DEXTROSE 1-4 GM/50ML-% IV SOLN
INTRAVENOUS | Status: AC
  Filled 2022-10-03: qty 50

## 2022-10-03 MED ORDER — CEFAZOLIN SODIUM-DEXTROSE 1-4 GM/50ML-% IV SOLN
1.0000 g | INTRAVENOUS | Status: AC
  Administered 2022-10-03: 1 g via INTRAVENOUS

## 2022-10-03 MED ORDER — MIDAZOLAM HCL 2 MG/ML PO SYRP: 8.0000 mg | ORAL_SOLUTION | Freq: Once | ORAL | Status: DC | PRN

## 2022-10-03 MED ORDER — DIPHENHYDRAMINE HCL 50 MG/ML IJ SOLN: 50.0000 mg | Freq: Once | INTRAMUSCULAR | Status: DC | PRN

## 2022-10-03 MED ORDER — MIDAZOLAM HCL 2 MG/2ML IJ SOLN
INTRAMUSCULAR | Status: DC | PRN
  Administered 2022-10-03: 1 mg via INTRAVENOUS

## 2022-10-03 MED ORDER — ONDANSETRON HCL 4 MG/2ML IJ SOLN: 4.0000 mg | Freq: Four times a day (QID) | INTRAMUSCULAR | Status: DC | PRN

## 2022-10-03 MED ORDER — FAMOTIDINE 20 MG PO TABS: 40.0000 mg | ORAL_TABLET | Freq: Once | ORAL | Status: DC | PRN

## 2022-10-03 MED ORDER — FENTANYL CITRATE PF 50 MCG/ML IJ SOSY
PREFILLED_SYRINGE | INTRAMUSCULAR | Status: AC
  Filled 2022-10-03: qty 1

## 2022-10-03 MED ORDER — HYDROMORPHONE HCL 1 MG/ML IJ SOLN: 1.0000 mg | Freq: Once | INTRAMUSCULAR | Status: DC | PRN

## 2022-10-03 SURGICAL SUPPLY — 8 items
BIOPATCH RED 1 DISK 7.0 (GAUZE/BANDAGES/DRESSINGS) IMPLANT
CATH PALINDROME-P 19CM W/VT (CATHETERS) IMPLANT
GOWN STRL REUS W/TWL 2XL LVL3 (GOWN DISPOSABLE) IMPLANT
GUIDEWIRE SUPER STIFF .035X180 (WIRE) IMPLANT
PACK ANGIOGRAPHY (CUSTOM PROCEDURE TRAY) IMPLANT
SUT MNCRL AB 4-0 PS2 18 (SUTURE) IMPLANT
SUT PROLENE 0 CT 1 30 (SUTURE) IMPLANT
TOWEL GREEN STERILE FF (TOWEL DISPOSABLE) IMPLANT

## 2022-10-03 NOTE — H&P (Signed)
[facility] Admission History & Physical  MRN : [id number]  Michelle Murillo is a 77 y.o. (07/01/1945) female who presents with chief complaint of No chief complaint on file. Marland Kitchen  History of Present Illness:  I am asked to evaluate the patient by the dialysis center. The patient was sent here because they were unable to achieve adequate dialysis yesterday. Furthermore the Center states they were unable to aspirate either lumen of the catheter yesterday.  This problem has been getting worse for about 1 week. The patient is unaware of any other change.   Patient denies pain or tenderness overlying the access.  There is no pain with dialysis.  Patient denies fevers or shaking chills while on dialysis.    There have multiple any past interventions and declots of his multiple different access.  The patient is not chronically hypotensive on dialysis.   No current facility-administered medications for this encounter.     Past Surgical History:  Procedure Laterality Date   ABDOMINAL AORTOGRAM N/A 06/23/2017   Procedure: ABDOMINAL AORTOGRAM;  Surgeon: Annice Needy, MD;  Location: [facility] INVASIVE CV LAB;  Service: Cardiovascular;  Laterality: N/A;   ABDOMINAL SURGERY  2017   aneurysm repair with dr. Jovannie Ulibarri   BRONCHOSCOPY  2015   done at [facility]. carcinoid of airways   BYPASS GRAFT  2013   cabg x 4   CARDIAC SURGERY  2013   cabg   CHOLECYSTECTOMY     age 46   CORONARY ARTERY BYPASS GRAFT     DIALYSIS/PERMA CATHETER INSERTION N/A 06/24/2022   Procedure: DIALYSIS/PERMA CATHETER INSERTION;  Surgeon: Annice Needy, MD;  Location: [facility] INVASIVE CV LAB;  Service: Cardiovascular;  Laterality: N/A;   DIALYSIS/PERMA CATHETER INSERTION N/A 09/03/2022   Procedure: DIALYSIS/PERMA CATHETER INSERTION;  Surgeon: Renford Dills, MD;  Location: [facility] INVASIVE CV LAB;  Service: Cardiovascular;  Laterality: N/A;   ENDOVASCULAR REPAIR/STENT GRAFT N/A 07/06/2018   Procedure: ENDOVASCULAR  REPAIR/STENT GRAFT;  Surgeon: Annice Needy, MD;  Location: [facility] INVASIVE CV LAB;  Service: Cardiovascular;  Laterality: N/A;   laryngeal nodule surgery     LEFT HEART CATH AND CORS/GRAFTS ANGIOGRAPHY Left 03/07/2020   Procedure: LEFT HEART CATH AND CORS/GRAFTS ANGIOGRAPHY;  Surgeon: Lamar Blinks, MD;  Location: [facility] INVASIVE CV LAB;  Service: Cardiovascular;  Laterality: Left;     Social History   Tobacco Use   Smoking status: Every Day    Packs/day: .5    Types: Cigarettes   Smokeless tobacco: Never   Tobacco comments:    6-7 cigs per day  Vaping Use   Vaping Use: Never used  Substance Use Topics   Alcohol use: No   Drug use: No    Family History  Problem Relation Age of Onset   Congestive Heart Failure Mother    Heart attack Mother    Cancer Father    Liver cancer Father    Cancer Paternal Grandfather     No family history of bleeding or clotting disorders, autoimmune disease or porphyria  Allergies  Allergen Reactions   Isosorbide Nitrate Nausea And Vomiting   Bupropion Nausea Only    Other reaction(s): Other (See Comments) "I didn't do well with it.".hallucinates   Morphine And Related Itching     REVIEW OF SYSTEMS (Negative unless checked)  Constitutional: [] Weight loss  [] Fever  [] Chills Cardiac: [] Chest pain   [] Chest pressure   [] Palpitations   [] Shortness of breath when laying flat   [] Shortness of  breath at rest   [x] Shortness of breath with exertion. Vascular:  [] Pain in legs with walking   [] Pain in legs at rest   [] Pain in legs when laying flat   [] Claudication   [] Pain in feet when walking  [] Pain in feet at rest  [] Pain in feet when laying flat   [x] History of DVT   [] Phlebitis   [] Swelling in legs   [] Varicose veins   [] Non-healing ulcers Pulmonary:   [] Uses home oxygen   [] Productive cough   [] Hemoptysis   [] Wheeze  [x] COPD   [] Asthma Neurologic:  [] Dizziness  [] Blackouts   [] Seizures   [] History of stroke   [] History of TIA  [] Aphasia    [] Temporary blindness   [] Dysphagia   [] Weakness or numbness in arms   [] Weakness or numbness in legs Musculoskeletal:  [x] Arthritis   [] Joint swelling   [x] Joint pain   [] Low back pain Hematologic:  [] Easy bruising  [] Easy bleeding   [] Hypercoagulable state   [] Anemic  [] Hepatitis Gastrointestinal:  [] Blood in stool   [] Vomiting blood  [] Gastroesophageal reflux/heartburn   [] Difficulty swallowing. Genitourinary:  [x] Chronic kidney disease   [] Difficult urination  [] Frequent urination  [] Burning with urination   [] Blood in urine Skin:  [] Rashes   [] Ulcers   [] Wounds Psychological:  [] History of anxiety   []  History of major depression.  Physical Examination  There were no vitals filed for this visit. There is no height or weight on file to calculate BMI. Gen: WD/WN, NAD Head: [place]/AT, No temporalis wasting. Ear/Nose/Throat: Hearing grossly intact, nares w/o erythema or drainage, oropharynx w/o Erythema/Exudate,  Eyes: Conjunctiva clear, sclera non-icteric. Visual acuity was poor Neck: Trachea midline.  No JVD.  Pulmonary:  Good air movement, respirations not labored, no use of accessory muscles.  Cardiac: irregular Vascular:  tunneled catheter without tenderness or drainage Vessel Right Left  Radial Palpable Palpable  Gastrointestinal: soft, non-tender/non-distended. No guarding/reflex.  Musculoskeletal: M/S 5/5 throughout.  Extremities without ischemic changes.  No deformity or atrophy.  Neurologic: Sensation grossly intact in extremities.  Symmetrical.  Speech is fluent. Motor exam as listed above. Psychiatric: Judgment intact, Mood & affect appropriate for pt's clinical situation. Dermatologic: No rashes or ulcers noted.  No cellulitis or open wounds.    CBC Lab Results  Component Value Date   WBC 5.9 11/27/2014   HGB 12.2 11/27/2014   HCT 38.1 11/27/2014   MCV 90.5 11/27/2014   PLT 134 (L) 11/27/2014    BMET    Component Value Date/Time   NA 142 11/27/2014 1719   NA 145  04/07/2014 0435   K 4.5 11/27/2014 1719   K 4.5 04/07/2014 0435   CL 111 11/27/2014 1719   CL 108 (H) 04/07/2014 0435   CO2 26 11/27/2014 1719   CO2 31 04/07/2014 0435   GLUCOSE 125 (H) 11/27/2014 1719   GLUCOSE 90 04/07/2014 0435   BUN 37 (H) 07/06/2018 0930   BUN 25 (H) 04/07/2014 0435   CREATININE 2.27 (H) 07/06/2018 0930   CREATININE 1.95 (H) 04/07/2014 0435   CALCIUM 9.0 11/27/2014 1719   CALCIUM 7.8 (L) 04/07/2014 0435   GFRNONAA 21 (L) 07/06/2018 0930   GFRNONAA 27 (L) 04/07/2014 0435   GFRNONAA 24 (L) 12/24/2012 0435   GFRAA 24 (L) 07/06/2018 0930   GFRAA 33 (L) 04/07/2014 0435   GFRAA 28 (L) 12/24/2012 0435   CrCl cannot be calculated (Patient's most recent lab result is older than the maximum 21 days allowed.).  COAG Lab Results  Component Value  Date   INR 0.93 11/12/2014   INR 1.1 04/07/2014    Radiology PERIPHERAL VASCULAR CATHETERIZATION  Result Date: 09/03/2022 See surgical note for result.   Assessment/Plan 1.  Complication dialysis device with thrombosis AV access:  Patient's tunneled catheter is thrombosed. The patient will undergo exchange of the catheter same venous access using interventional techniques.  The risks and benefits were described to the patient.  All questions were answered.  The patient agrees to proceed with intervention.  2.  End-stage renal disease requiring hemodialysis:  Patient will continue dialysis therapy without further interruption if a successful exchange is not achieved then new site will be found for tunneled catheter placement. Dialysis has already been arranged since the patient missed their previous session 3.  Hypertension:  Patient will continue medical management; nephrology is following no changes in oral medications. 4. Diabetes mellitus:  Glucose will be monitored and oral medications been held this morning once the patient has undergone the patient's procedure po intake will be reinitiated and again Accu-Cheks will be  used to assess the blood glucose level and treat as needed. The patient will be restarted on the patient's usual hypoglycemic regime 5.  Coronary artery disease:  EKG will be monitored. Nitrates will be used if needed. The patient's oral cardiac medications will be continued.    Festus Barren, MD  10/03/2022 12:21 PM

## 2022-10-04 ENCOUNTER — Encounter: Payer: Self-pay | Admitting: Vascular Surgery

## 2022-10-16 ENCOUNTER — Telehealth (INDEPENDENT_AMBULATORY_CARE_PROVIDER_SITE_OTHER): Payer: Self-pay

## 2022-10-16 ENCOUNTER — Encounter: Payer: Self-pay | Admitting: Vascular Surgery

## 2022-10-16 ENCOUNTER — Encounter
Admission: RE | Disposition: A | Discharge status: Home / Self Care | Payer: Self-pay | Source: Home / Self Care | Attending: Vascular Surgery

## 2022-10-16 ENCOUNTER — Ambulatory Visit
Admission: RE | Admit: 2022-10-16 | Discharge: 2022-10-16 | Disposition: A | Discharge status: Home / Self Care | Payer: Medicare HMO | Attending: Vascular Surgery | Admitting: Vascular Surgery

## 2022-10-16 DIAGNOSIS — T8249XA Other complication of vascular dialysis catheter, initial encounter: Secondary | ICD-10-CM

## 2022-10-16 DIAGNOSIS — N186 End stage renal disease: Secondary | ICD-10-CM

## 2022-10-16 DIAGNOSIS — Y841 Kidney dialysis as the cause of abnormal reaction of the patient, or of later complication, without mention of misadventure at the time of the procedure: Secondary | ICD-10-CM | POA: Insufficient documentation

## 2022-10-16 DIAGNOSIS — Z992 Dependence on renal dialysis: Secondary | ICD-10-CM | POA: Diagnosis not present

## 2022-10-16 HISTORY — PX: DIALYSIS/PERMA CATHETER INSERTION: CATH118288

## 2022-10-16 LAB — POTASSIUM (ARMC VASCULAR LAB ONLY): Potassium (ARMC vascular lab): 4.8 mmol/L (ref 3.5–5.1)

## 2022-10-16 SURGERY — DIALYSIS/PERMA CATHETER INSERTION
Anesthesia: Moderate Sedation

## 2022-10-16 MED ORDER — MIDAZOLAM HCL 2 MG/2ML IJ SOLN
INTRAMUSCULAR | Status: AC
  Filled 2022-10-16: qty 2

## 2022-10-16 MED ORDER — FENTANYL CITRATE (PF) 100 MCG/2ML IJ SOLN
INTRAMUSCULAR | Status: DC | PRN
  Administered 2022-10-16: 25 ug via INTRAVENOUS
  Administered 2022-10-16 (×2): 12.5 ug via INTRAVENOUS
  Administered 2022-10-16: 50 ug via INTRAVENOUS

## 2022-10-16 MED ORDER — FENTANYL CITRATE PF 50 MCG/ML IJ SOSY
PREFILLED_SYRINGE | INTRAMUSCULAR | Status: AC
  Filled 2022-10-16: qty 1

## 2022-10-16 MED ORDER — FAMOTIDINE 20 MG PO TABS: 40.0000 mg | ORAL_TABLET | Freq: Once | ORAL | Status: DC | PRN

## 2022-10-16 MED ORDER — SODIUM CHLORIDE 0.9 % IV SOLN: INTRAVENOUS | Status: DC

## 2022-10-16 MED ORDER — MIDAZOLAM HCL 2 MG/2ML IJ SOLN
INTRAMUSCULAR | Status: DC | PRN
  Administered 2022-10-16 (×4): 1 mg via INTRAVENOUS

## 2022-10-16 MED ORDER — ALTEPLASE 2 MG IJ SOLR
INTRAMUSCULAR | Status: DC | PRN
  Administered 2022-10-16: 4 mg

## 2022-10-16 MED ORDER — CEFAZOLIN SODIUM-DEXTROSE 1-4 GM/50ML-% IV SOLN
INTRAVENOUS | Status: AC
  Filled 2022-10-16: qty 50

## 2022-10-16 MED ORDER — ALTEPLASE 2 MG IJ SOLR
INTRAMUSCULAR | Status: AC
  Filled 2022-10-16: qty 4

## 2022-10-16 MED ORDER — HEPARIN SODIUM (PORCINE) 1000 UNIT/ML IJ SOLN
INTRAMUSCULAR | Status: AC
  Filled 2022-10-16: qty 10

## 2022-10-16 MED ORDER — CEFAZOLIN SODIUM-DEXTROSE 1-4 GM/50ML-% IV SOLN
1.0000 g | INTRAVENOUS | Status: AC
  Administered 2022-10-16: 1 g via INTRAVENOUS

## 2022-10-16 MED ORDER — HEPARIN SODIUM (PORCINE) 10000 UNIT/ML IJ SOLN
INTRAMUSCULAR | Status: AC
  Filled 2022-10-16: qty 1

## 2022-10-16 MED ORDER — ONDANSETRON HCL 4 MG/2ML IJ SOLN: 4.0000 mg | Freq: Four times a day (QID) | INTRAMUSCULAR | Status: DC | PRN

## 2022-10-16 MED ORDER — DIPHENHYDRAMINE HCL 50 MG/ML IJ SOLN: 50.0000 mg | Freq: Once | INTRAMUSCULAR | Status: DC | PRN

## 2022-10-16 MED ORDER — METHYLPREDNISOLONE SODIUM SUCC 125 MG IJ SOLR: 125.0000 mg | Freq: Once | INTRAMUSCULAR | Status: DC | PRN

## 2022-10-16 MED ORDER — HEPARIN SODIUM (PORCINE) 1000 UNIT/ML IJ SOLN
INTRAMUSCULAR | Status: DC | PRN
  Administered 2022-10-16: 3000 [IU] via INTRAVENOUS

## 2022-10-16 MED ORDER — HYDROMORPHONE HCL 1 MG/ML IJ SOLN: 1.0000 mg | Freq: Once | INTRAMUSCULAR | Status: DC | PRN

## 2022-10-16 MED ORDER — MIDAZOLAM HCL 2 MG/ML PO SYRP: 8.0000 mg | ORAL_SOLUTION | Freq: Once | ORAL | Status: DC | PRN

## 2022-10-16 SURGICAL SUPPLY — 23 items
BALLN ULTRVRSE 8X40X75C (BALLOONS) ×1
BALLOON ULTRVRSE 8X40X75C (BALLOONS) IMPLANT
BIOPATCH RED 1 DISK 7.0 (GAUZE/BANDAGES/DRESSINGS) IMPLANT
CANNULA 5F STIFF (CANNULA) IMPLANT
CATH PALINDROME-P 19CM W/VT (CATHETERS) IMPLANT
COVER PROBE ULTRASOUND 5X96 (MISCELLANEOUS) IMPLANT
DERMABOND ADVANCED .7 DNX12 (GAUZE/BANDAGES/DRESSINGS) IMPLANT
DEVICE ENSNARE  12MMX20MM (VASCULAR PRODUCTS) ×1
DEVICE ENSNARE 12MMX20MM (VASCULAR PRODUCTS) IMPLANT
GAUZE SPONGE 4X4 12PLY STRL (GAUZE/BANDAGES/DRESSINGS) IMPLANT
GLIDEWIRE STIFF .35X180X3 HYDR (WIRE) IMPLANT
GUIDEWIRE SUPER STIFF .035X180 (WIRE) IMPLANT
KIT ENCORE 26 ADVANTAGE (KITS) IMPLANT
KIT SNARE 25MM LOOP 120CM 6F (VASCULAR PRODUCTS) IMPLANT
NDL ENTRY 21GA 7CM ECHOTIP (NEEDLE) IMPLANT
NEEDLE ENTRY 21GA 7CM ECHOTIP (NEEDLE) ×1 IMPLANT
PACK ANGIOGRAPHY (CUSTOM PROCEDURE TRAY) IMPLANT
SET INTRO CAPELLA COAXIAL (SET/KITS/TRAYS/PACK) IMPLANT
SHEATH BRITE TIP 6FRX11 (SHEATH) IMPLANT
SHEATH DESTINATION 8F 45CM (SHEATH) IMPLANT
SUT MNCRL AB 4-0 PS2 18 (SUTURE) IMPLANT
SUT SILK 0 FSL (SUTURE) IMPLANT
WIRE MAGIC TOR.035 180C (WIRE) IMPLANT

## 2022-10-16 NOTE — Discharge Instructions (Signed)
Call Dr Kristeen Mans office in morning to let them know you want to proceed with fistula placement. [phone].

## 2022-10-16 NOTE — Telephone Encounter (Signed)
Spoke with the patient and she is scheduled with Dr. Wyn Quaker on 10/16/22 for a 2:00 pm arrival to the [facility] for a permcath exchange. Pre-procedure instructions were discussed and will be sent to Mychart.

## 2022-10-16 NOTE — Op Note (Signed)
OPERATIVE NOTE   PROCEDURE: Insertion of tunneled dialysis catheter right IJ approach same venous access. Fibrin stripping of the catheter tract from the right IJ approach using balloon angioplasty. Attempted fibrin stripping from the left common femoral approach Superior venacavogram  PRE-OPERATIVE DIAGNOSIS: Nonfunction of existing tunneled dialysis catheter, and stage renal disease requiring hemodialysis   POST-OPERATIVE DIAGNOSIS: Same SURGEON: Levora Dredge  ANESTHESIA: Conscious sedation was administered under my direct supervision by the interventional radiology RN.  IV Versed plus fentanyl were utilized. Continuous ECG, pulse oximetry and blood pressure was monitored throughout the entire procedure.  Conscious sedation was for a total of 84 minutes.  ESTIMATED BLOOD LOSS: Minimal cc  CONTRAST USED: 20 cc  FLUOROSCOPY TIME: 17.9 minutes  INDICATIONS:   Michelle Murillo is a 77 y.o.y.o. female who presents with poor flow and nonfunction of the tunneled dialysis catheter.  Adequate dialysis has not been possible.  This is now the third time that this catheter is being replaced in approximately 1 month the most recent of which was just 2 weeks ago.  Prior to the procedure discussion regarding fibrin sheath causing catheter malfunction and stripping of the fibrin was reviewed with the patient the risks and the benefits have been discussed all questions been answered patient agrees to move forward with catheter salvage and/or exchange as indicated.  DESCRIPTION: After obtaining full informed written consent, the patient was positioned supine. The right neck and chest wall as well as the bilateral groin area was prepped and draped in a sterile fashion.   Initially injection of contrast through the catheter itself which does aspirate fairly well shows a clear outline of a mature fibrin sheath.  Given this finding and her history of multiple catheter exchanges over a wire I felt that  disrupting this sheath would be key to reestablishing durable catheter access.  Initially I used ultrasound to examine the right groin.  Although I was able to access the common femoral vein which appeared somewhat small with a microneedle the microwire kept coursing in a retrograde fashion down the thigh.  After several attempts I used the ultrasound to evaluate the left common femoral which was quite large echolucent and easily compressible.  This indicated it was a much better vein to access.  Under direct ultrasound visualization after an image of been recorded a microneedle was inserted into the vein and a microwire advanced.  Microsheath was then placed 035 wire was then advanced under fluoroscopy and a 6 French sheath was placed.  Using a stiff Glidewire and a snare catheter I advanced the wire catheter into the right atrium.  3 different types of snares were then used to try to grasp the catheter so that it could be stripped of its fibrin coating unfortunately the catheter appears to be well encased and did not allow the snare to course over it.  I did try to use a balloon at 1 point to just disrupt the catheter and see if I could free it up so that the snare would then have access but this was not successful.  After numerous tries the wire and catheter were removed and I opted to disrupt the fibrin sheath from the jugular approach.  We returned to the sterile field at the shoulder and chest wall.  1% lidocaine was infiltrated into the soft tissue surrounding the existing catheter from its exit site all the way to the base of the neck.  Using a hemostat the cuff was easily freed as it had  not been in for very long.  An Amplatz Super Stiff wire was then advanced through the catheter itself under fluoroscopic guidance and negotiated down into the inferior vena cava to allow for substantial purchase.  The catheter is then removed without difficulty.  Pressure is held at the base of the neck.  An 8  [place] destination sheath is then advanced over the wire and positioned with its tip in the proximal right internal jugular vein.  An 8 mm x 40 mm balloon is then advanced down beyond the level of the previous catheter essentially the balloon was inflated in the inferior vena cava and then pulled back in the inflated mode through the previous track.  This was performed 3 times in order to disrupt the fibrin sheath that had been surrounding the previous catheter.  Follow-up imaging by injection through the destination sheath demonstrated a widely patent superior vena cava without any evidence of a fibrin tunnel or fibrin sheath.  A 19 cm tip to cuff catheter was then prepped on the field.  The destination sheath was removed and the dilator and peel-away sheath were inserted over the Amplatz wire.  Dilator was removed and the catheter was fed over the wire through the peel-away sheath.  Peel-away sheath was removed.  The catheter was then adjusted so that the tip was in the mid atrium and the Amplatz wire was removed.  Catheter has a smooth contour.  Both lumens aspirated easily and flushed well.  After verification of smooth contour with proper tip position under fluoroscopy the catheter is packed with 2 mg of tPA per lumen.  Catheter secured to the skin of the right chest wall with 0 silk. A sterile dressing is applied with a Biopatch.  COMPLICATIONS: None  CONDITION: Good  Levora Dredge [facility] Office:  [phone]   10/16/2022,6:08 PM

## 2022-10-17 ENCOUNTER — Encounter: Payer: Self-pay | Admitting: Vascular Surgery

## 2022-10-22 ENCOUNTER — Encounter: Payer: Self-pay | Admitting: Vascular Surgery

## 2022-10-23 ENCOUNTER — Encounter: Payer: Self-pay | Admitting: Vascular Surgery

## 2022-10-23 ENCOUNTER — Telehealth (INDEPENDENT_AMBULATORY_CARE_PROVIDER_SITE_OTHER): Payer: Self-pay

## 2022-10-23 NOTE — Telephone Encounter (Addendum)
I attempted to contact the patient to schedule her for a left brachialcephalic  AVF with Dr. Wyn Quaker. A message was left for a return call. Patient called back and will be scheduled on 11/07/22 for surgery at the [facility]. Pre-procedure instructions were discussed and will be sent to Mychart and mailed.

## 2022-10-25 NOTE — Op Note (Signed)
OPERATIVE NOTE    PRE-OPERATIVE DIAGNOSIS: 1. ESRD 2. Non-functional permcath  POST-OPERATIVE DIAGNOSIS: same as above  PROCEDURE: Fluoroscopic guidance for placement of catheter Placement of a 19 cm tip to cuff tunneled hemodialysis catheter via the right internal jugular vein and removal of previous catheter  SURGEON: Festus Barren, MD  ANESTHESIA:  Local with moderate conscious sedation for 16 minutes using 1 mg of Versed and 50 mcg of Fentanyl  ESTIMATED BLOOD LOSS: 3 cc  FINDING(S): none  SPECIMEN(S):  None  INDICATIONS:   Patient is a 77 y.o.female who presents with non-functional dialysis catheter and ESRD.  The patient needs long term dialysis access for their ESRD, and a Permcath is necessary.  Risks and benefits are discussed and informed consent is obtained.    DESCRIPTION: After obtaining full informed written consent, the patient was brought back to the vascular suite. The patient received moderate conscious sedation during a face-to-face encounter with me present throughout the entire procedure and supervising the RN monitoring the vital signs, pulse oximetry, telemetry, and mental status throughout the entire procedure. The patient's existing catheter, right neck and chest were sterilely prepped and draped in a sterile surgical field was created.  The existing catheter was dissected free from the fibrous sheath securing the cuff with hemostats and blunt dissection.  A wire was placed. The existing catheter was then removed and the wire used to keep venous access. I selected a 19 cm tip to cuff tunneled dialysis catheter.  Using fluoroscopic guidance the catheter tips were parked in the right atrium. The appropriate distal connectors were placed. It withdrew blood well and flushed easily with heparinized saline and a concentrated heparin solution was then placed. It was secured to the chest wall with 2 Prolene sutures. A 4-0 Monocryl pursestring suture was placed around the exit  site. Sterile dressings were placed. The patient tolerated the procedure well and was taken to the recovery room in stable condition.  COMPLICATIONS: None  CONDITION: Stable  Festus Barren 10/25/2022 9:36 AM   This note was created with Dragon Medical transcription system. Any errors in dictation are purely unintentional.

## 2022-10-29 ENCOUNTER — Other Ambulatory Visit: Payer: Self-pay

## 2022-10-29 ENCOUNTER — Encounter
Admission: RE | Admit: 2022-10-29 | Discharge: 2022-10-29 | Disposition: A | Discharge status: Home / Self Care | Payer: Medicare HMO | Source: Ambulatory Visit | Attending: Vascular Surgery | Admitting: Vascular Surgery

## 2022-10-29 ENCOUNTER — Other Ambulatory Visit (INDEPENDENT_AMBULATORY_CARE_PROVIDER_SITE_OTHER): Payer: Self-pay | Admitting: Nurse Practitioner

## 2022-10-29 ENCOUNTER — Encounter: Payer: Self-pay | Admitting: Vascular Surgery

## 2022-10-29 VITALS — BP 134/69 | HR 66 | Resp 14 | Ht 65.5 in | Wt 207.0 lb

## 2022-10-29 DIAGNOSIS — I4510 Unspecified right bundle-branch block: Secondary | ICD-10-CM | POA: Insufficient documentation

## 2022-10-29 DIAGNOSIS — I517 Cardiomegaly: Secondary | ICD-10-CM | POA: Insufficient documentation

## 2022-10-29 DIAGNOSIS — N186 End stage renal disease: Secondary | ICD-10-CM

## 2022-10-29 DIAGNOSIS — N184 Chronic kidney disease, stage 4 (severe): Secondary | ICD-10-CM

## 2022-10-29 DIAGNOSIS — E119 Type 2 diabetes mellitus without complications: Secondary | ICD-10-CM

## 2022-10-29 DIAGNOSIS — Z01818 Encounter for other preprocedural examination: Secondary | ICD-10-CM | POA: Insufficient documentation

## 2022-10-29 HISTORY — DX: Dyspnea, unspecified: R06.00

## 2022-10-29 HISTORY — DX: Sleep apnea, unspecified: G47.30

## 2022-10-29 LAB — CBC WITH DIFFERENTIAL/PLATELET
Abs Immature Granulocytes: 0.02 10*3/uL (ref 0.00–0.07)
Basophils Absolute: 0 10*3/uL (ref 0.0–0.1)
Basophils Relative: 1 %
Eosinophils Absolute: 0.1 10*3/uL (ref 0.0–0.5)
Eosinophils Relative: 1 %
HCT: 35.9 % — ABNORMAL LOW (ref 36.0–46.0)
Hemoglobin: 10.9 g/dL — ABNORMAL LOW (ref 12.0–15.0)
Immature Granulocytes: 1 %
Lymphocytes Relative: 26 %
Lymphs Abs: 1.2 10*3/uL (ref 0.7–4.0)
MCH: 28.5 pg (ref 26.0–34.0)
MCHC: 30.4 g/dL (ref 30.0–36.0)
MCV: 93.7 fL (ref 80.0–100.0)
Monocytes Absolute: 0.4 10*3/uL (ref 0.1–1.0)
Monocytes Relative: 9 %
Neutro Abs: 2.8 10*3/uL (ref 1.7–7.7)
Neutrophils Relative %: 62 %
Platelets: 138 10*3/uL — ABNORMAL LOW (ref 150–400)
RBC: 3.83 MIL/uL — ABNORMAL LOW (ref 3.87–5.11)
RDW: 15.1 % (ref 11.5–15.5)
WBC: 4.4 10*3/uL (ref 4.0–10.5)
nRBC: 0 % (ref 0.0–0.2)

## 2022-10-29 LAB — BASIC METABOLIC PANEL
Anion gap: 8 (ref 5–15)
BUN: 30 mg/dL — ABNORMAL HIGH (ref 8–23)
CO2: 29 mmol/L (ref 22–32)
Calcium: 8.8 mg/dL — ABNORMAL LOW (ref 8.9–10.3)
Chloride: 103 mmol/L (ref 98–111)
Creatinine, Ser: 2.88 mg/dL — ABNORMAL HIGH (ref 0.44–1.00)
GFR, Estimated: 16 mL/min — ABNORMAL LOW (ref >60)
Glucose, Bld: 101 mg/dL — ABNORMAL HIGH (ref 70–99)
Potassium: 4.4 mmol/L (ref 3.5–5.1)
Sodium: 140 mmol/L (ref 135–145)

## 2022-10-29 LAB — TYPE AND SCREEN
ABO/RH(D): A NEG
Antibody Screen: NEGATIVE

## 2022-10-29 NOTE — Patient Instructions (Addendum)
Your procedure is scheduled on: Thursday 6/13 To find out your arrival time, please call [phone] between 1PM - 3PM on:   Wednesday 11/06/22 Report to the Registration Desk on the 1st floor of the [facility]. Valet parking is available.  If your arrival time is 6:00 am, do not arrive before that time as the [facility] entrance doors do not open until 6:00 am.  REMEMBER: Instructions that are not followed completely may result in serious medical risk, up to and including death; or upon the discretion of your surgeon and anesthesiologist your surgery may need to be rescheduled.  Do not eat food or drink any liquids after midnight the night before surgery.  No gum chewing or hard candies.  One week prior to surgery: Stop Anti-inflammatories (NSAIDS) such as Advil, Aleve, Ibuprofen, Motrin, Naproxen, Naprosyn and Aspirin based products such as Excedrin, Goody's Powder, BC Powder. You may however, continue to take Tylenol if needed for pain up until the day of surgery.  Stop ANY OVER THE COUNTER supplements until after surgery.  Continue taking all prescribed medications thru Wednesday 11/06/22.   TAKE ONLY THESE MEDICATIONS THE MORNING OF SURGERY WITH A SIP OF WATER:  amLODipine (NORVASC)  atorvastatin (LIPITOR)  metoprolol tartrate (LOPRESSOR)  pantoprazole (PROTONIX Antacid (take one the night before and one on the morning of surgery - helps to prevent nausea after surgery.)  No Alcohol for 24 hours before or after surgery.  No Smoking including e-cigarettes for 24 hours before surgery.  No chewable tobacco products for at least 6 hours before surgery.  No nicotine patches on the day of surgery.  Do not use any "recreational" drugs for at least a week (preferably 2 weeks) before your surgery.  Please be advised that the combination of cocaine and anesthesia may have negative outcomes, up to and including death. If you test positive for cocaine, your surgery will be  cancelled.  On the morning of surgery brush your teeth with toothpaste and water, you may rinse your mouth with mouthwash if you wish. Do not swallow any toothpaste or mouthwash.  Use CHG Soap or wipes as directed on instruction sheet.  Do not wear lotions, powders, or perfumes.   Do not shave body hair from the neck down 48 hours before surgery.  Wear comfortable clothing (specific to your surgery type) to the hospital.  Do not wear jewelry, make-up, hairpins, clips or nail polish.  Contact lenses, hearing aids and dentures may not be worn into surgery.  Do not bring valuables to the hospital. [facility] is not responsible for any missing/lost belongings or valuables.   Notify your doctor if there is any change in your medical condition (cold, fever, infection).  If you are being discharged the day of surgery, you will not be allowed to drive home. You will need a responsible individual to drive you home and stay with you for 24 hours after surgery.   If you are taking public transportation, you will need to have a responsible individual with you.  If you are being admitted to the hospital overnight, leave your suitcase in the car. After surgery it may be brought to your room.  In case of increased patient census, it may be necessary for you, the patient, to continue your postoperative care in the Same Day Surgery department.  After surgery, you can help prevent lung complications by doing breathing exercises.  Take deep breaths and cough every 1-2 hours. Your doctor may order a device called  an Facilities manager to help you take deep breaths. When coughing or sneezing, hold a pillow firmly against your incision with both hands. This is called "splinting." Doing this helps protect your incision. It also decreases belly discomfort.  Surgery Visitation Policy:  Patients undergoing a surgery or procedure may have two family members or support persons with them as long as the  person is not COVID-19 positive or experiencing its symptoms.   Inpatient Visitation:    Visiting hours are 7 a.m. to 8 p.m. Up to four visitors are allowed at one time in a patient room. The visitors may rotate out with other people during the day. One designated support person (adult) may remain overnight.  Please call the Pre-admissions Testing Dept. at [phone] if you have any questions about these instructions.  Preparing the Skin Before Surgery     To help prevent the risk of infection at your surgical site, we are now providing you with rinse-free Sage 2% Chlorhexidine Gluconate (CHG) disposable wipes.  Chlorhexidine Gluconate (CHG) Soap  o An antiseptic cleaner that kills germs and bonds with the skin to continue killing germs even after washing  o Used for showering the night before surgery and morning of surgery  The night before surgery: Shower or bathe with warm water. Do not apply perfume, lotions, powders. Wait one hour after shower. Skin should be dry and cool. Open Sage wipe package - use 6 disposable cloths. Wipe body using one cloth for the right arm, one cloth for the left arm, one cloth for the right leg, one cloth for the left leg, one cloth for the chest/abdomen area, and one cloth for the back. Do not use on open wounds or sores. Do not use on face or genitals (private parts). If you are breast feeding, do not use on breasts. 5. Do not rinse, allow to dry. 6. Skin may feel "tacky" for several minutes. 7. Dress in clean clothes. 8. Place clean sheets on your bed and do not sleep with pets.  REPEAT ABOVE ON THE MORNING OF SURGERY BEFORE ARRIVING TO THE HOSPITAL.

## 2022-11-01 ENCOUNTER — Telehealth (INDEPENDENT_AMBULATORY_CARE_PROVIDER_SITE_OTHER): Payer: Self-pay

## 2022-11-01 NOTE — Telephone Encounter (Signed)
I received a fax from Mio at [facility] [address] regarding the patient not running on dialysis well. I advised that I did not have any open slots to schedule the patient until 11/07/22. Annabelle Harman stated that would be fine and to schedule that day and fax the appt to her. This will be sent to [facility] to give to the patient. Patient is have a fistula placement surgery on 11/07/22 and will have a permcath exchange added. I spoke with Annabelle Harman at [facility] [address] and informed her of this as well.

## 2022-11-05 ENCOUNTER — Encounter: Payer: Self-pay | Admitting: Vascular Surgery

## 2022-11-06 ENCOUNTER — Encounter: Payer: Self-pay | Admitting: Vascular Surgery

## 2022-11-07 ENCOUNTER — Ambulatory Visit: Payer: Medicare HMO | Admitting: Urgent Care

## 2022-11-07 ENCOUNTER — Ambulatory Visit
Admission: RE | Admit: 2022-11-07 | Discharge: 2022-11-07 | Disposition: A | Discharge status: Home / Self Care | Payer: Medicare HMO | Attending: Vascular Surgery | Admitting: Vascular Surgery

## 2022-11-07 ENCOUNTER — Encounter: Payer: Self-pay | Admitting: Vascular Surgery

## 2022-11-07 ENCOUNTER — Encounter
Admission: RE | Disposition: A | Discharge status: Home / Self Care | Payer: Self-pay | Source: Home / Self Care | Attending: Vascular Surgery

## 2022-11-07 ENCOUNTER — Other Ambulatory Visit: Payer: Self-pay

## 2022-11-07 DIAGNOSIS — N184 Chronic kidney disease, stage 4 (severe): Secondary | ICD-10-CM

## 2022-11-07 DIAGNOSIS — E1122 Type 2 diabetes mellitus with diabetic chronic kidney disease: Secondary | ICD-10-CM | POA: Diagnosis not present

## 2022-11-07 DIAGNOSIS — N186 End stage renal disease: Secondary | ICD-10-CM | POA: Diagnosis not present

## 2022-11-07 DIAGNOSIS — I132 Hypertensive heart and chronic kidney disease with heart failure and with stage 5 chronic kidney disease, or end stage renal disease: Secondary | ICD-10-CM | POA: Diagnosis not present

## 2022-11-07 DIAGNOSIS — I509 Heart failure, unspecified: Secondary | ICD-10-CM | POA: Diagnosis not present

## 2022-11-07 DIAGNOSIS — Z992 Dependence on renal dialysis: Secondary | ICD-10-CM | POA: Diagnosis not present

## 2022-11-07 DIAGNOSIS — E119 Type 2 diabetes mellitus without complications: Secondary | ICD-10-CM

## 2022-11-07 HISTORY — PX: AV FISTULA PLACEMENT: SHX1204

## 2022-11-07 HISTORY — DX: Unspecified ovarian cyst, right side: N83.201

## 2022-11-07 LAB — POCT I-STAT, CHEM 8
BUN: 29 mg/dL — ABNORMAL HIGH (ref 8–23)
Calcium, Ion: 1.18 mmol/L (ref 1.15–1.40)
Chloride: 103 mmol/L (ref 98–111)
Creatinine, Ser: 3.5 mg/dL — ABNORMAL HIGH (ref 0.44–1.00)
Glucose, Bld: 100 mg/dL — ABNORMAL HIGH (ref 70–99)
HCT: 37 % (ref 36.0–46.0)
Hemoglobin: 12.6 g/dL (ref 12.0–15.0)
Potassium: 4.9 mmol/L (ref 3.5–5.1)
Sodium: 140 mmol/L (ref 135–145)
TCO2: 31 mmol/L (ref 22–32)

## 2022-11-07 LAB — GLUCOSE, CAPILLARY: Glucose-Capillary: 96 mg/dL (ref 70–99)

## 2022-11-07 SURGERY — DIALYSIS/PERMA CATHETER INSERTION
Anesthesia: Moderate Sedation

## 2022-11-07 SURGERY — ARTERIOVENOUS (AV) FISTULA CREATION
Anesthesia: General | Laterality: Left

## 2022-11-07 MED ORDER — PROPOFOL 10 MG/ML IV BOLUS
INTRAVENOUS | Status: AC
  Filled 2022-11-07: qty 20

## 2022-11-07 MED ORDER — TRAMADOL HCL 50 MG PO TABS: 50.0000 mg | ORAL_TABLET | Freq: Four times a day (QID) | ORAL | 0 refills | Status: DC | PRN

## 2022-11-07 MED ORDER — OXYCODONE HCL 5 MG PO TABS
5.0000 mg | ORAL_TABLET | Freq: Once | ORAL | Status: AC | PRN
  Administered 2022-11-07: 5 mg via ORAL

## 2022-11-07 MED ORDER — CHLORHEXIDINE GLUCONATE 0.12 % MT SOLN
15.0000 mL | Freq: Once | OROMUCOSAL | Status: AC
  Administered 2022-11-07: 15 mL via OROMUCOSAL

## 2022-11-07 MED ORDER — LIDOCAINE HCL (CARDIAC) PF 100 MG/5ML IV SOSY
PREFILLED_SYRINGE | INTRAVENOUS | Status: DC | PRN
  Administered 2022-11-07: 40 mg via INTRAVENOUS

## 2022-11-07 MED ORDER — EPHEDRINE 5 MG/ML INJ
INTRAVENOUS | Status: AC
  Filled 2022-11-07: qty 5

## 2022-11-07 MED ORDER — HEMOSTATIC AGENTS (NO CHARGE) OPTIME
TOPICAL | Status: DC | PRN
  Administered 2022-11-07: 1 via TOPICAL

## 2022-11-07 MED ORDER — CHLORHEXIDINE GLUCONATE 0.12 % MT SOLN
OROMUCOSAL | Status: AC
  Filled 2022-11-07: qty 15

## 2022-11-07 MED ORDER — SEVOFLURANE IN SOLN
RESPIRATORY_TRACT | Status: AC
  Filled 2022-11-07: qty 250

## 2022-11-07 MED ORDER — IPRATROPIUM-ALBUTEROL 0.5-2.5 (3) MG/3ML IN SOLN
3.0000 mL | Freq: Once | RESPIRATORY_TRACT | Status: AC
  Administered 2022-11-07: 3 mL via RESPIRATORY_TRACT

## 2022-11-07 MED ORDER — CEFAZOLIN SODIUM-DEXTROSE 2-4 GM/100ML-% IV SOLN
2.0000 g | INTRAVENOUS | Status: AC
  Administered 2022-11-07: 2 g via INTRAVENOUS

## 2022-11-07 MED ORDER — DEXAMETHASONE SODIUM PHOSPHATE 10 MG/ML IJ SOLN
INTRAMUSCULAR | Status: DC | PRN
  Administered 2022-11-07: 4 mg via INTRAVENOUS

## 2022-11-07 MED ORDER — EPHEDRINE SULFATE (PRESSORS) 50 MG/ML IJ SOLN
INTRAMUSCULAR | Status: DC | PRN
  Administered 2022-11-07 (×2): 10 mg via INTRAVENOUS

## 2022-11-07 MED ORDER — HEPARIN SODIUM (PORCINE) 5000 UNIT/ML IJ SOLN
INTRAMUSCULAR | Status: AC
  Filled 2022-11-07: qty 1

## 2022-11-07 MED ORDER — HEPARIN SODIUM (PORCINE) 1000 UNIT/ML IJ SOLN
INTRAMUSCULAR | Status: DC | PRN
  Administered 2022-11-07: 3000 [IU] via INTRAVENOUS

## 2022-11-07 MED ORDER — SODIUM CHLORIDE 0.9 % IV SOLN: INTRAVENOUS | Status: DC

## 2022-11-07 MED ORDER — PROPOFOL 10 MG/ML IV BOLUS
INTRAVENOUS | Status: DC | PRN
  Administered 2022-11-07: 50 mg via INTRAVENOUS
  Administered 2022-11-07: 150 mg via INTRAVENOUS

## 2022-11-07 MED ORDER — CEFAZOLIN SODIUM-DEXTROSE 2-4 GM/100ML-% IV SOLN
INTRAVENOUS | Status: AC
  Filled 2022-11-07: qty 100

## 2022-11-07 MED ORDER — LIDOCAINE HCL (PF) 2 % IJ SOLN
INTRAMUSCULAR | Status: AC
  Filled 2022-11-07: qty 5

## 2022-11-07 MED ORDER — FENTANYL CITRATE (PF) 100 MCG/2ML IJ SOLN
INTRAMUSCULAR | Status: DC | PRN
  Administered 2022-11-07 (×2): 25 ug via INTRAVENOUS
  Administered 2022-11-07: 50 ug via INTRAVENOUS

## 2022-11-07 MED ORDER — IPRATROPIUM-ALBUTEROL 0.5-2.5 (3) MG/3ML IN SOLN
RESPIRATORY_TRACT | Status: AC
  Filled 2022-11-07: qty 3

## 2022-11-07 MED ORDER — DEXAMETHASONE SODIUM PHOSPHATE 10 MG/ML IJ SOLN
INTRAMUSCULAR | Status: AC
  Filled 2022-11-07: qty 1

## 2022-11-07 MED ORDER — OXYCODONE HCL 5 MG/5ML PO SOLN: 5.0000 mg | Freq: Once | ORAL | Status: AC | PRN

## 2022-11-07 MED ORDER — OXYCODONE HCL 5 MG PO TABS
ORAL_TABLET | ORAL | Status: AC
  Filled 2022-11-07: qty 1

## 2022-11-07 MED ORDER — ONDANSETRON HCL 4 MG/2ML IJ SOLN
INTRAMUSCULAR | Status: DC | PRN
  Administered 2022-11-07: 4 mg via INTRAVENOUS

## 2022-11-07 MED ORDER — SODIUM CHLORIDE 0.9 % IV SOLN
INTRAVENOUS | Status: DC | PRN
  Administered 2022-11-07: 501 mL via SURGICAL_CAVITY

## 2022-11-07 MED ORDER — FENTANYL CITRATE (PF) 100 MCG/2ML IJ SOLN
INTRAMUSCULAR | Status: AC
  Filled 2022-11-07: qty 2

## 2022-11-07 MED ORDER — CHLORHEXIDINE GLUCONATE CLOTH 2 % EX PADS: 6.0000 | MEDICATED_PAD | Freq: Once | CUTANEOUS | Status: DC

## 2022-11-07 MED ORDER — LACTATED RINGERS IV SOLN: INTRAVENOUS | Status: DC | PRN

## 2022-11-07 MED ORDER — FENTANYL CITRATE (PF) 100 MCG/2ML IJ SOLN: 25.0000 ug | INTRAMUSCULAR | Status: DC | PRN

## 2022-11-07 MED ORDER — CHLORHEXIDINE GLUCONATE CLOTH 2 % EX PADS
6.0000 | MEDICATED_PAD | Freq: Once | CUTANEOUS | Status: AC
  Administered 2022-11-07: 6 via TOPICAL

## 2022-11-07 MED ORDER — ORAL CARE MOUTH RINSE: 15.0000 mL | Freq: Once | OROMUCOSAL | Status: AC

## 2022-11-07 MED ORDER — ONDANSETRON HCL 4 MG/2ML IJ SOLN
INTRAMUSCULAR | Status: AC
  Filled 2022-11-07: qty 2

## 2022-11-07 SURGICAL SUPPLY — 50 items
ADH SKN CLS APL DERMABOND .7 (GAUZE/BANDAGES/DRESSINGS) ×1
APL PRP STRL LF DISP 70% ISPRP (MISCELLANEOUS) ×1
BAG DECANTER FOR FLEXI CONT (MISCELLANEOUS) ×1 IMPLANT
BLADE SURG SZ11 CARB STEEL (BLADE) ×1 IMPLANT
BOOT SUTURE VASCULAR YLW (MISCELLANEOUS) ×1
BRUSH SCRUB EZ 4% CHG (MISCELLANEOUS) ×1 IMPLANT
CHLORAPREP W/TINT 26 (MISCELLANEOUS) ×1 IMPLANT
CLAMP SUTURE YELLOW 5 PAIRS (MISCELLANEOUS) ×1 IMPLANT
CLIP SPRNG 6 S-JAW DBL (CLIP) ×1 IMPLANT
CLIP SPRNG 6MM S-JAW DBL (CLIP) ×1
DERMABOND ADVANCED .7 DNX12 (GAUZE/BANDAGES/DRESSINGS) ×1 IMPLANT
DRESSING SURGICEL FIBRLLR 1X2 (HEMOSTASIS) ×1 IMPLANT
DRSG SURGICEL FIBRILLAR 1X2 (HEMOSTASIS) ×1
ELECT CAUTERY BLADE 6.4 (BLADE) ×1 IMPLANT
ELECT REM PT RETURN 9FT ADLT (ELECTROSURGICAL) ×1
ELECTRODE REM PT RTRN 9FT ADLT (ELECTROSURGICAL) ×1 IMPLANT
GLOVE BIO SURGEON STRL SZ7 (GLOVE) ×2 IMPLANT
GOWN STRL REUS W/ TWL LRG LVL3 (GOWN DISPOSABLE) ×2 IMPLANT
GOWN STRL REUS W/ TWL XL LVL3 (GOWN DISPOSABLE) ×2 IMPLANT
GOWN STRL REUS W/TWL LRG LVL3 (GOWN DISPOSABLE) ×2
GOWN STRL REUS W/TWL XL LVL3 (GOWN DISPOSABLE) ×2
IV NS 500ML (IV SOLUTION) ×1
IV NS 500ML BAXH (IV SOLUTION) ×1 IMPLANT
KIT TURNOVER KIT A (KITS) ×1 IMPLANT
LABEL OR SOLS (LABEL) ×1 IMPLANT
LOOP VESSEL MAXI 1X406 RED (MISCELLANEOUS) ×1 IMPLANT
LOOP VESSEL MINI 0.8X406 BLUE (MISCELLANEOUS) ×1 IMPLANT
MANIFOLD NEPTUNE II (INSTRUMENTS) ×1 IMPLANT
NDL FILTER BLUNT 18X1 1/2 (NEEDLE) ×1 IMPLANT
NEEDLE FILTER BLUNT 18X1 1/2 (NEEDLE) ×1 IMPLANT
PACK EXTREMITY ARMC (MISCELLANEOUS) ×1 IMPLANT
PAD PREP OB/GYN DISP 24X41 (PERSONAL CARE ITEMS) ×1 IMPLANT
SOLUTION CELL SAVER (CLIP) ×1 IMPLANT
STOCKINETTE 48X4 2 PLY STRL (GAUZE/BANDAGES/DRESSINGS) ×1 IMPLANT
STOCKINETTE STRL 4IN 9604848 (GAUZE/BANDAGES/DRESSINGS) ×1 IMPLANT
SUT MNCRL AB 4-0 PS2 18 (SUTURE) ×1 IMPLANT
SUT PROLENE 6 0 BV (SUTURE) ×4 IMPLANT
SUT SILK 2 0 (SUTURE) ×1
SUT SILK 2-0 18XBRD TIE 12 (SUTURE) ×1 IMPLANT
SUT SILK 3 0 (SUTURE) ×1
SUT SILK 3-0 18XBRD TIE 12 (SUTURE) ×1 IMPLANT
SUT SILK 4 0 (SUTURE) ×1
SUT SILK 4-0 18XBRD TIE 12 (SUTURE) ×1 IMPLANT
SUT VIC AB 3-0 SH 27 (SUTURE) ×1
SUT VIC AB 3-0 SH 27X BRD (SUTURE) ×1 IMPLANT
SYR 20ML LL LF (SYRINGE) ×1 IMPLANT
SYR 3ML LL SCALE MARK (SYRINGE) ×1 IMPLANT
TAG SUTURE CLAMP YLW 5PR (MISCELLANEOUS) ×1
TRAP FLUID SMOKE EVACUATOR (MISCELLANEOUS) ×1 IMPLANT
WATER STERILE IRR 500ML POUR (IV SOLUTION) ×1 IMPLANT

## 2022-11-07 NOTE — Anesthesia Procedure Notes (Signed)
Procedure Name: LMA Insertion Date/Time: 11/07/2022 7:45 AM  Performed by: Monico Hoar, CRNAPre-anesthesia Checklist: Patient identified, Patient being monitored, Timeout performed, Emergency Drugs available and Suction available Patient Re-evaluated:Patient Re-evaluated prior to induction Oxygen Delivery Method: Circle system utilized Preoxygenation: Pre-oxygenation with 100% oxygen Induction Type: IV induction Ventilation: Mask ventilation without difficulty LMA: LMA inserted LMA Size: 4.0 Tube type: Oral Number of attempts: 1 Placement Confirmation: positive ETCO2 and breath sounds checked- equal and bilateral Tube secured with: Tape Dental Injury: Teeth and Oropharynx as per pre-operative assessment

## 2022-11-07 NOTE — Discharge Instructions (Signed)

## 2022-11-07 NOTE — Anesthesia Postprocedure Evaluation (Signed)
Anesthesia Post Note  Patient: Michelle Murillo  Procedure(s) Performed: ARTERIOVENOUS (AV) FISTULA CREATION (BRACHIALCEPHALIC) (Left)  Patient location during evaluation: PACU Anesthesia Type: General Level of consciousness: awake and alert, oriented and patient cooperative Pain management: pain level controlled Vital Signs Assessment: post-procedure vital signs reviewed and stable Respiratory status: spontaneous breathing, nonlabored ventilation and respiratory function stable Cardiovascular status: blood pressure returned to baseline and stable Postop Assessment: adequate PO intake Anesthetic complications: no   No notable events documented.   Last Vitals:  Vitals:   11/07/22 0949 11/07/22 1018  BP: 120/61 (!) 107/56  Pulse: (!) 56 (!) 58  Resp: 12 16  Temp: 36.6 C 36.5 C  SpO2: 97% 95%    Last Pain:  Vitals:   11/07/22 1018  TempSrc: Temporal  PainSc: 4                  Reed Breech

## 2022-11-07 NOTE — Op Note (Signed)
[facility]   OPERATIVE NOTE   PROCEDURE: Left brachiocephalic arteriovenous fistula placement  PRE-OPERATIVE DIAGNOSIS: 1.  ESRD        POST-OPERATIVE DIAGNOSIS: 1. ESRD       SURGEON: Festus Barren, MD  ASSISTANT(S): none  ANESTHESIA: general  ESTIMATED BLOOD LOSS: 10 cc  FINDING(S): Adequate cephalic vein for fistula creation  SPECIMEN(S):  none  INDICATIONS:   Michelle Murillo is a 77 y.o. female who presents with renal failure in need of pemanent dialysis acces.  The patient is scheduled for left arm AVF placement.  The patient is aware the risks include but are not limited to: bleeding, infection, steal syndrome, nerve damage, ischemic monomelic neuropathy, failure to mature, and need for additional procedures.  The patient is aware of the risks of the procedure and elects to proceed forward.  DESCRIPTION: After full informed written consent was obtained from the patient, the patient was brought back to the operating room and placed supine upon the operating table.  Prior to induction, the patient received IV antibiotics.   After obtaining adequate anesthesia, the patient was then prepped and draped in the standard fashion for a left arm access procedure.  I made a curvilinear incision at the level of the antecubital fossa and dissected through the subcutaneous tissue and fascia to gain exposure of the brachial artery.  This was noted to be patent and adequate in size for fistula creation.  This was dissected out proximally and distally and prepared for control with vessel loops .  I then dissected out the cephalic vein.  This was noted to be patent and adequate in size for fistula creation.  I then gave the patient 3000 units of intravenous heparin.  The vein was marked for orientation and the distal segment of the vein was ligated with a  2-0 silk, and the vein was transected.  I then instilled the heparinized saline into the vein and clamped it.  At this  point, I reset my exposure of the brachial artery and pulled up control on the vessel loops.  I made an arteriotomy with a #11 blade, and then I extended the arteriotomy with a Potts scissor.  I injected heparinized saline proximal and distal to this arteriotomy.  The vein was then sewn to the artery in an end-to-side configuration with a running stitch of 6-0 Prolene.  Prior to completing this anastomosis, I allowed the vein and artery to backbleed.  There was no evidence of clot from any vessels.  I completed the anastomosis in the usual fashion and then released all vessel loops and clamps.  There was a palpable  thrill in the venous outflow, and there was a palpable pulse in the artery distal to the anastomosis.  At this point, I irrigated out the surgical wound.  Surgicel was placed. There was no further active bleeding.  The subcutaneous tissue was reapproximated with a running stitch of 3-0 Vicryl.  The skin was then closed with a 4-0 Monocryl suture.  The skin was then cleaned, dried, and reinforced with Dermabond.  The patient tolerated this procedure well and was taken to the recovery room in stable condition  COMPLICATIONS: None  CONDITION: Stable   Festus Barren    11/07/2022, 9:07 AM  This note was created with Dragon Medical transcription system. Any errors in dictation are purely unintentional.

## 2022-11-07 NOTE — Anesthesia Preprocedure Evaluation (Signed)
Anesthesia Evaluation  Patient identified by MRN, date of birth, ID band Patient awake    Reviewed: Allergy & Precautions, NPO status , Patient's Chart, lab work & pertinent test results, reviewed documented beta blocker date and time   History of Anesthesia Complications (+) DIFFICULT AIRWAY and history of anesthetic complications  Airway Mallampati: III  TM Distance: >3 FB Neck ROM: full    Dental  (+) Chipped, Edentulous Lower, Edentulous Upper, Dental Advidsory Given   Pulmonary shortness of breath and at rest, sleep apnea and Continuous Positive Airway Pressure Ventilation , COPD,  COPD inhaler, Current SmokerPatient did not abstain from smoking.   Pulmonary exam normal        Cardiovascular hypertension, Pt. on medications and Pt. 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  pt was in               a coma for 18 days after this.(during CABG) No date: COPD (chronic obstructive pulmonary disease) (HCC) No date: Coronary artery disease     Comment:  a.) s/p 4v CABG 06/12/2011; b.) LHC 03/07/2020: 10%               dRCA, 75% p-mRCA, 75% pLCx, 50% p-mLAD, 45% mLAD; SVG-RCA              and SVG-OM  occluded No date: Diabetes mellitus without complication (HCC)     Comment:  borderline, does not take any medication No date: Dyspnea 2019: ESRD on hemodialysis (HCC)     Comment:  a.) M-W-F No date: GERD (gastroesophageal reflux disease) No date: Gout No date: Hypertension No date: Legally blind     Comment:  unable to see any more than shapes No date: Right ovarian cyst 06/12/2011: S/P CABG x 4     Comment:  a.) LIMA-LAD, SVG-OM1, SVG-LCx, SVG-RCA --> procedure               complicated by bradycardic arrest and respiratory failure              (pulmonary edema) resulting in prolonged ICU admission.               Developed rapid A.fib and required DCCV x 3 durrsing ICU               admission. 2013: Seizures (HCC)     Comment:  only happened during cabg procedure while on the table No date: Sleep apnea     Comment:  a.) does not use noctural PAP therapy  Past Surgical History: 06/23/2017: ABDOMINAL AORTOGRAM; N/A     Comment:  Procedure: ABDOMINAL AORTOGRAM;  Surgeon: Festus Barren  S,               MD;  Location: [facility] INVASIVE CV LAB;  Service:               Cardiovascular;  Laterality: N/A; 2017: ABDOMINAL SURGERY     Comment:  aneurysm repair with dr. dew 2015: BRONCHOSCOPY     Comment:  done at [facility]. carcinoid of airways No date: CHOLECYSTECTOMY     Comment:  age 28 06/12/2011: CORONARY ARTERY BYPASS GRAFT 06/24/2022: DIALYSIS/PERMA CATHETER INSERTION; N/A     Comment:  Procedure: DIALYSIS/PERMA CATHETER INSERTION;  Surgeon:               Annice Needy, MD;  Location: [facility] INVASIVE CV LAB;                Service: Cardiovascular;  Laterality: N/A; 09/03/2022: DIALYSIS/PERMA CATHETER INSERTION; N/A     Comment:  Procedure: DIALYSIS/PERMA CATHETER INSERTION;  Surgeon:               Renford Dills, MD;  Location: [facility] INVASIVE CV LAB;               Service: Cardiovascular;  Laterality: N/A; 10/16/2022: DIALYSIS/PERMA CATHETER INSERTION; N/A     Comment:  Procedure:  DIALYSIS/PERMA CATHETER INSERTION;  Surgeon:               Renford Dills, MD;  Location: [facility] INVASIVE CV LAB;               Service: Cardiovascular;  Laterality: N/A; 10/03/2022: DIALYSIS/PERMA CATHETER INSERTION; N/A     Comment:  Procedure: DIALYSIS/PERMA CATHETER INSERTION;  Surgeon:               Annice Needy, MD;  Location: [facility] INVASIVE CV LAB;                Service: Cardiovascular;  Laterality: N/A; 07/06/2018: ENDOVASCULAR REPAIR/STENT GRAFT; N/A     Comment:  Procedure: ENDOVASCULAR REPAIR/STENT GRAFT;  Surgeon:               Annice Needy, MD;  Location: [facility] INVASIVE CV LAB;                Service: Cardiovascular;  Laterality: N/A; No date: laryngeal nodule surgery 03/07/2020: LEFT HEART CATH AND CORS/GRAFTS ANGIOGRAPHY; Left     Comment:  Procedure: LEFT HEART CATH AND CORS/GRAFTS ANGIOGRAPHY;               Surgeon: Lamar Blinks, MD;  Location: [facility] INVASIVE               CV LAB;  Service: Cardiovascular;  Laterality: Left; No date: OOPHORECTOMY; Right  BMI    Body Mass Index: 33.92 kg/m      Reproductive/Obstetrics negative OB ROS                             Anesthesia Physical Anesthesia Plan  ASA: 3  Anesthesia Plan: General   Post-op Pain Management:    Induction: Intravenous  PONV Risk Score and Plan: 3 and Ondansetron, Dexamethasone and Treatment may vary due to age or medical condition  Airway Management Planned: LMA  Additional Equipment:   Intra-op Plan:   Post-operative Plan: Extubation in OR  Informed Consent: I have reviewed the patients History and Physical, chart, labs and discussed the procedure including the risks, benefits and alternatives for the proposed anesthesia with  the patient or authorized representative who has indicated his/her understanding and acceptance.     Dental Advisory Given  Plan Discussed with: Anesthesiologist, CRNA and Surgeon  Anesthesia Plan Comments: (Patient consented for  risks of anesthesia including but not limited to:  - adverse reactions to medications - damage to eyes, teeth, lips or other oral mucosa - nerve damage due to positioning  - sore throat or hoarseness - Damage to heart, brain, nerves, lungs, other parts of body or loss of life  Patient voiced understanding.)        Anesthesia Quick Evaluation

## 2022-11-07 NOTE — Transfer of Care (Signed)
Immediate Anesthesia Transfer of Care Note  Patient: Michelle Murillo  Procedure(s) Performed: ARTERIOVENOUS (AV) FISTULA CREATION (BRACHIALCEPHALIC) (Left)  Patient Location: PACU  Anesthesia Type:General  Level of Consciousness: awake and drowsy  Airway & Oxygen Therapy: Patient Spontanous Breathing and Patient connected to face mask oxygen  Post-op Assessment: Report given to RN and Post -op Vital signs reviewed and stable  Post vital signs: Reviewed and stable  Last Vitals:  Vitals Value Taken Time  BP 137/60 11/07/22 0905  Temp    Pulse 65 11/07/22 0906  Resp 17 11/07/22 0906  SpO2 94 % 11/07/22 0906  Vitals shown include unvalidated device data.  Last Pain:  Vitals:   11/07/22 0616  TempSrc: Oral  PainSc: 0-No pain         Complications: No notable events documented.

## 2022-11-07 NOTE — H&P (Signed)
[facility] Admission History & Physical  MRN : [id number]  Michelle Murillo is a 77 y.o. (Feb 13, 1946) female who presents with chief complaint of No chief complaint on file. Marland Kitchen  History of Present Illness: Patient presents for AVF creation. Catheter working poorly. No new complaints today.  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  pt was in a coma for 18 days after this.(during CABG)   COPD (chronic obstructive pulmonary disease) (HCC)    Coronary artery disease    a.) s/p 4v CABG 06/12/2011; b.) LHC 03/07/2020: 10% dRCA, 75% p-mRCA, 75% pLCx, 50% p-mLAD, 45% mLAD; SVG-RCA and SVG-OM occluded   Diabetes mellitus without complication (HCC)    borderline, does not take any medication   Dyspnea    ESRD on hemodialysis (HCC) 2019   a.) M-W-F   GERD (gastroesophageal reflux disease)    Gout    Hypertension    Legally blind    unable to see any more than shapes   Right ovarian cyst    S/P CABG x 4 06/12/2011   a.) LIMA-LAD, SVG-OM1, SVG-LCx, SVG-RCA --> procedure complicated by bradycardic arrest and respiratory failure (pulmonary edema) resulting in prolonged ICU admission. Developed rapid A.fib and required DCCV x 3 durrsing ICU admission.   Seizures (HCC) 2013   only happened during  cabg procedure while on the table   Sleep apnea    a.) does not use noctural PAP therapy    Past Surgical History:  Procedure Laterality Date   ABDOMINAL AORTOGRAM N/A 06/23/2017   Procedure: ABDOMINAL AORTOGRAM;  Surgeon: Annice Needy, MD;  Location: [facility] INVASIVE CV LAB;  Service: Cardiovascular;  Laterality: N/A;   ABDOMINAL SURGERY  2017   aneurysm repair with dr. Kaloni Bisaillon   BRONCHOSCOPY  2015   done at [facility]. carcinoid of airways   CHOLECYSTECTOMY     age 19   CORONARY ARTERY BYPASS GRAFT  06/12/2011   DIALYSIS/PERMA CATHETER INSERTION N/A 06/24/2022   Procedure: DIALYSIS/PERMA CATHETER INSERTION;  Surgeon: Annice Needy, MD;  Location: [facility] INVASIVE CV LAB;  Service: Cardiovascular;  Laterality: N/A;   DIALYSIS/PERMA CATHETER INSERTION N/A 09/03/2022   Procedure: DIALYSIS/PERMA CATHETER INSERTION;  Surgeon: Renford Dills, MD;  Location: [facility] INVASIVE CV LAB;  Service: Cardiovascular;  Laterality: N/A;   DIALYSIS/PERMA CATHETER INSERTION N/A 10/16/2022   Procedure: DIALYSIS/PERMA CATHETER INSERTION;  Surgeon: Renford Dills, MD;  Location: [facility] INVASIVE CV LAB;  Service: Cardiovascular;  Laterality: N/A;   DIALYSIS/PERMA CATHETER INSERTION N/A 10/03/2022   Procedure: DIALYSIS/PERMA CATHETER INSERTION;  Surgeon: Annice Needy, MD;  Location: [facility] INV[facility] CV [facility];  Service: Cardiovascular;  Laterality: N/A;   ENDOVASCULAR REPAIR/STENT GRAFT N/A  07/06/2018   Procedure: ENDOVASCULAR REPAIR/STENT GRAFT;  Surgeon: Annice Needy, MD;  Location: [facility] INVASIVE CV LAB;  Service: Cardiovascular;  Laterality: N/A;   laryngeal nodule surgery     LEFT HEART CATH AND CORS/GRAFTS ANGIOGRAPHY Left 03/07/2020   Procedure: LEFT HEART CATH AND CORS/GRAFTS ANGIOGRAPHY;  Surgeon: Lamar Blinks, MD;  Location: [facility] INVASIVE CV LAB;  Service: Cardiovascular;  Laterality: Left;   OOPHORECTOMY Right      Social History   Tobacco Use   Smoking status: Every Day    Packs/day: .5    Types:  Cigarettes   Smokeless tobacco: Never   Tobacco comments:    6-7 cigs per day  Vaping Use   Vaping Use: Never used  Substance Use Topics   Alcohol use: No   Drug use: No     Family History  Problem Relation Age of Onset   Congestive Heart Failure Mother    Heart attack Mother    Cancer Father    Liver cancer Father    Cancer Paternal Grandfather     Allergies  Allergen Reactions   Isosorbide Nitrate Nausea And Vomiting   Bupropion Nausea Only    Other reaction(s): Other (See Comments) "I didn't do well with it.".hallucinates   Morphine And Codeine Itching    Per pt morphine only    REVIEW OF SYSTEMS (Negative unless checked)   Constitutional: [] Weight loss  [] Fever  [] Chills Cardiac: [] Chest pain   [] Chest pressure   [] Palpitations   [] Shortness of breath when laying flat   [] Shortness of breath at rest   [x] Shortness of breath with exertion. Vascular:  [] Pain in legs with walking   [] Pain in legs at rest   [] Pain in legs when laying flat   [] Claudication   [] Pain in feet when walking  [] Pain in feet at rest  [] Pain in feet when laying flat   [x] History of DVT   [] Phlebitis   [] Swelling in legs   [] Varicose veins   [] Non-healing ulcers Pulmonary:   [] Uses home oxygen   [] Productive cough   [] Hemoptysis   [] Wheeze  [x] COPD   [] Asthma Neurologic:  [] Dizziness  [] Blackouts   [] Seizures   [] History of stroke   [] History of TIA  [] Aphasia   [] Temporary blindness   [] Dysphagia   [] Weakness or numbness in arms   [] Weakness or numbness in legs Musculoskeletal:  [x] Arthritis   [] Joint swelling   [x] Joint pain   [] Low back pain Hematologic:  [] Easy bruising  [] Easy bleeding   [] Hypercoagulable state   [] Anemic  [] Hepatitis Gastrointestinal:  [] Blood in stool   [] Vomiting blood  [] Gastroesophageal reflux/heartburn   [] Difficulty swallowing. Genitourinary:  [x] Chronic kidney disease   [] Difficult urination  [] Frequent urination  [] Burning with urination   [] Blood in urine Skin:   [] Rashes   [] Ulcers   [] Wounds Psychological:  [] History of anxiety   []  History of major depression.  Physical Examination  Vitals:   11/07/22 0616  BP: 137/64  Pulse: (!) 58  Resp: 14  Temp: 98 F (36.7 C)  TempSrc: Oral  SpO2: 97%  Weight: 93.9 kg  Height: 5' 5.5" (1.664 m)   Body mass index is 33.92 kg/m. Gen: WD/WN, NAD Head: [place]/AT, No temporalis wasting. Ear/Nose/Throat: Hearing grossly intact, nares w/o erythema or drainage, oropharynx w/o Erythema/Exudate,  Eyes: Conjunctiva clear, sclera non-icteric. Visual acuity was poor Neck: Trachea midline.  No JVD.  Pulmonary:  Good air movement, respirations not labored, no use of accessory muscles.  Cardiac: irregular Vascular:  tunneled catheter without tenderness or drainage Vessel Right Left  Radial Palpable Palpable  Gastrointestinal: soft, non-tender/non-distended. No guarding/reflex.  Musculoskeletal: M/S 5/5 throughout.  Extremities without ischemic changes.  No deformity or atrophy.  Neurologic: Sensation grossly intact in extremities.  Symmetrical.  Speech is fluent. Motor exam as listed above. Psychiatric: Judgment intact, Mood & affect appropriate for pt's clinical situation. Dermatologic: No rashes or ulcers noted.  No cellulitis or open wounds.     CBC Lab Results  Component Value Date   WBC 4.4 10/29/2022   HGB 12.6 11/07/2022   HCT 37.0 11/07/2022   MCV 93.7 10/29/2022   PLT 138 (L) 10/29/2022    BMET    Component Value Date/Time   NA 140 11/07/2022 0636   NA 145 04/07/2014 0435   K 4.9 11/07/2022 0636   K 4.5 04/07/2014 0435   CL 103 11/07/2022 0636   CL 108 (H) 04/07/2014 0435   CO2 29 10/29/2022 1455   CO2 31 04/07/2014 0435   GLUCOSE 100 (H) 11/07/2022 0636   GLUCOSE 90 04/07/2014 0435   BUN 29 (H) 11/07/2022 0636   BUN 25 (H) 04/07/2014 0435   CREATININE 3.50 (H) 11/07/2022 0636   CREATININE 1.95 (H) 04/07/2014 0435   CALCIUM 8.8 (L) 10/29/2022 1455   CALCIUM 7.8 (L) 04/07/2014  0435   GFRNONAA 16 (L) 10/29/2022 1455   GFRNONAA 27 (L) 04/07/2014 0435   GFRNONAA 24 (L) 12/24/2012 0435   GFRAA 24 (L) 07/06/2018 0930   GFRAA 33 (L) 04/07/2014 0435   GFRAA 28 (L) 12/24/2012 0435   Estimated Creatinine Clearance: 15.2 mL/min (A) (by C-G formula based on SCr of 3.5 mg/dL (H)).  COAG Lab Results  Component Value Date   INR 0.93 11/12/2014   INR 1.1 04/07/2014    Radiology PERIPHERAL VASCULAR CATHETERIZATION  Result Date: 10/16/2022 See surgical note for result.    Assessment/Plan 1.  End-stage renal disease requiring hemodialysis:  Patient will have AVF created today.  Risks and benefits discussed 2.  Hypertension:  Patient will continue medical management; nephrology is following no changes in oral medications. 3. Diabetes mellitus:  Glucose will be monitored and oral medications been held this morning once the patient has undergone the patient's procedure po intake will be reinitiated and again Accu-Cheks will be used to assess the blood glucose level and treat as needed. The patient will be restarted on the patient's usual hypoglycemic regime   Festus Barren, MD  11/07/2022 7:31 AM

## 2022-11-08 ENCOUNTER — Encounter: Payer: Self-pay | Admitting: Vascular Surgery

## 2022-11-18 ENCOUNTER — Encounter: Payer: Self-pay | Admitting: Vascular Surgery

## 2022-12-23 ENCOUNTER — Other Ambulatory Visit (INDEPENDENT_AMBULATORY_CARE_PROVIDER_SITE_OTHER): Payer: Self-pay | Admitting: Vascular Surgery

## 2022-12-23 DIAGNOSIS — Z9889 Other specified postprocedural states: Secondary | ICD-10-CM

## 2022-12-23 DIAGNOSIS — N186 End stage renal disease: Secondary | ICD-10-CM

## 2022-12-24 ENCOUNTER — Encounter (INDEPENDENT_AMBULATORY_CARE_PROVIDER_SITE_OTHER): Payer: Self-pay | Admitting: Nurse Practitioner

## 2022-12-24 ENCOUNTER — Ambulatory Visit (INDEPENDENT_AMBULATORY_CARE_PROVIDER_SITE_OTHER): Payer: Medicare HMO

## 2022-12-24 ENCOUNTER — Ambulatory Visit (INDEPENDENT_AMBULATORY_CARE_PROVIDER_SITE_OTHER): Payer: Medicare HMO | Admitting: Nurse Practitioner

## 2022-12-24 VITALS — BP 128/61 | HR 72 | Resp 18 | Ht 65.5 in | Wt 201.0 lb

## 2022-12-24 DIAGNOSIS — Z9889 Other specified postprocedural states: Secondary | ICD-10-CM | POA: Diagnosis not present

## 2022-12-24 DIAGNOSIS — E119 Type 2 diabetes mellitus without complications: Secondary | ICD-10-CM

## 2022-12-24 DIAGNOSIS — N186 End stage renal disease: Secondary | ICD-10-CM

## 2022-12-24 DIAGNOSIS — I10 Essential (primary) hypertension: Secondary | ICD-10-CM

## 2022-12-25 NOTE — Progress Notes (Signed)
Subjective:    Patient ID: Michelle Murillo, female    DOB: Nov 07, 1945, 77 y.o.   MRN: [id number] Chief Complaint  Patient presents with   Follow-up    Follow up in 6 weeks (12/19/2022) For wound re-check with HDA    Michelle Murillo is a 77 year old female who presents today for evaluation of her left brachiocephalic AV fistula created on 11/07/2022.  The patient had some tenderness near the incision area which is improved as she also had a relatively large hematoma.  That is also decreased and improved but is still slightly present.  She has a good thrill but the fistula is not obviously palpable.  Her duplex today also shows elevated velocities concerning for stenosis near the anastomosis site but there is also an additional branch which has flow present as well.    Review of Systems  All other systems reviewed and are negative.      Objective:   Physical Exam Vitals reviewed.  HENT:     Head: Normocephalic.  Cardiovascular:     Rate and Rhythm: Normal rate.     Pulses:          Radial pulses are 2+ on the left side.  Pulmonary:     Effort: Pulmonary effort is normal.  Musculoskeletal:        General: Tenderness present.  Skin:    General: Skin is warm and dry.  Neurological:     Mental Status: She is alert and oriented to person, place, and time.  Psychiatric:        Mood and Affect: Mood normal.        Behavior: Behavior normal.        Thought Content: Thought content normal.        Judgment: Judgment normal.     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  pt was in a coma for 18 days after  this.(during CABG)   COPD (chronic obstructive pulmonary disease) (HCC)    Coronary artery disease    a.) s/p 4v CABG 06/12/2011; b.) LHC 03/07/2020: 10% dRCA, 75% p-mRCA, 75% pLCx, 50% p-mLAD, 45% mLAD; SVG-RCA and SVG-OM occluded   Diabetes mellitus without complication (HCC)    borderline, does not take any medication   Dyspnea    ESRD on hemodialysis (HCC) 2019   a.) M-W-F   GERD (gastroesophageal reflux disease)    Gout    Hypertension    Legally blind    unable to see any more than shapes   Right ovarian cyst    S/P CABG x 4 06/12/2011   a.) LIMA-LAD, SVG-OM1, SVG-LCx, SVG-RCA --> procedure complicated by bradycardic arrest and respiratory failure (pulmonary edema) resulting in prolonged ICU admission. Developed rapid A.fib and required DCCV x 3 durrsing ICU admission.   Seizures (HCC) 2013   only happened during cabg procedure while on the table   Sleep apnea    a.) does not use noctural PAP therapy    Social History  Socioeconomic History   Marital status: Widowed    Spouse name: Not on file   Number of children: 2   Years of education: Not on file   Highest education level: Not on file  Occupational History   Not on file  Tobacco Use   Smoking status: Every Day    Current packs/day: 0.50    Types: Cigarettes   Smokeless tobacco: Never   Tobacco comments:    6-7 cigs per day  Vaping Use   Vaping status: Never Used  Substance and Sexual Activity   Alcohol use: No   Drug use: No   Sexual activity: Not on file  Other Topics Concern   Not on file  Social History Narrative   2 sons: 24 & 76 y.o. : lives by herself    Social Determinants of Health   Financial Resource Strain: Not on file  Food Insecurity: Not on file  Transportation Needs: Not on file  Physical Activity: Not on file  Stress: Not on file  Social Connections: Not on file  Intimate Partner Violence: Not on file    Past Surgical History:  Procedure Laterality Date   ABDOMINAL AORTOGRAM  N/A 06/23/2017   Procedure: ABDOMINAL AORTOGRAM;  Surgeon: Annice Needy, MD;  Location: [facility];  Service: Cardiovascular;  Laterality: N/A;   ABDOMINAL SURGERY  2017   aneurysm repair with dr. dew   AV FISTULA PLACEMENT Left 11/07/2022   Procedure: ARTERIOVENOUS (AV) FISTULA CREATION (BRACHIALCEPHALIC);  Surgeon: Annice Needy, MD;  Location: [facility] ORS;  Service: Vascular;  Laterality: Left;   BRONCHOSCOPY  2015   done at [facility]. carcinoid of airways   CHOLECYSTECTOMY     age 44   CORONARY ARTERY BYPASS GRAFT  06/12/2011   DIALYSIS/PERMA CATHETER INSERTION N/A 06/24/2022   Procedure: DIALYSIS/PERMA CATHETER INSERTION;  Surgeon: Annice Needy, MD;  Location: [facility];  Service: Cardiovascular;  Laterality: N/A;   DIALYSIS/PERMA CATHETER INSERTION N/A 09/03/2022   Procedure: DIALYSIS/PERMA CATHETER INSERTION;  Surgeon: Renford Dills, MD;  Location: [facility];  Service: Cardiovascular;  Laterality: N/A;   DIALYSIS/PERMA CATHETER INSERTION N/A 10/16/2022   Procedure: DIALYSIS/PERMA CATHETER INSERTION;  Surgeon: Renford Dills, MD;  Location: [facility];  Service: Cardiovascular;  Laterality: N/A;   DIALYSIS/PERMA CATHETER INSERTION N/A 10/03/2022   Procedure: DIALYSIS/PERMA CATHETER INSERTION;  Surgeon: Annice Needy, MD;  Location: [facility];  Service: Cardiovascular;  Laterality: N/A;   ENDOVASCULAR REPAIR/STENT GRAFT N/A 07/06/2018   Procedure: ENDOVASCULAR REPAIR/STENT GRAFT;  Surgeon: Annice Needy, MD;  Location: [facility];  Service: Cardiovascular;  Laterality: N/A;   laryngeal nodule surgery     LEFT HEART CATH AND CORS/GRAFTS ANGIOGRAPHY Left 03/07/2020   Procedure: LEFT HEART CATH AND CORS/GRAFTS ANGIOGRAPHY;  Surgeon: Lamar Blinks, MD;  Location: [facility];  Service: Cardiovascular;  Laterality: Left;   OOPHORECTOMY Right     Family History  Problem Relation Age of Onset   Congestive Heart  Failure Mother    Heart attack Mother    Cancer Father    Liver cancer Father    Cancer Paternal Grandfather     Allergies  Allergen Reactions   Isosorbide Nitrate Nausea And Vomiting   Bupropion Nausea Only    Other reaction(s): Other (See Comments) "I didn't do well with it.".hallucinates   Morphine And Codeine Itching    Per pt morphine only  Latest Ref Rng & Units 11/07/2022    6:36 AM 10/29/2022    2:55 PM 11/27/2014    5:19 PM  CBC  WBC 4.0 - 10.5 K/uL  4.4  5.9   Hemoglobin 12.0 - 15.0 g/dL 16.1  09.6  04.5   Hematocrit 36.0 - 46.0 % 37.0  35.9  38.1   Platelets 150 - 400 K/uL  138  134       CMP     Component Value Date/Time   NA 140 11/07/2022 0636   NA 145 04/07/2014 0435   K 4.9 11/07/2022 0636   K 4.5 04/07/2014 0435   CL 103 11/07/2022 0636   CL 108 (H) 04/07/2014 0435   CO2 29 10/29/2022 1455   CO2 31 04/07/2014 0435   GLUCOSE 100 (H) 11/07/2022 0636   GLUCOSE 90 04/07/2014 0435   BUN 29 (H) 11/07/2022 0636   BUN 25 (H) 04/07/2014 0435   CREATININE 3.50 (H) 11/07/2022 0636   CREATININE 1.95 (H) 04/07/2014 0435   CALCIUM 8.8 (L) 10/29/2022 1455   CALCIUM 7.8 (L) 04/07/2014 0435   PROT 7.1 11/27/2014 1719   PROT 5.8 (L) 04/07/2014 0435   ALBUMIN 4.0 11/27/2014 1719   ALBUMIN 2.7 (L) 04/07/2014 0435   AST 19 11/27/2014 1719   AST 29 04/07/2014 0435   ALT 15 11/27/2014 1719   ALT 32 04/07/2014 0435   ALKPHOS 76 11/27/2014 1719   ALKPHOS 61 04/07/2014 0435   BILITOT 0.4 11/27/2014 1719   BILITOT 0.3 04/07/2014 0435   GFRNONAA 16 (L) 10/29/2022 1455   GFRNONAA 27 (L) 04/07/2014 0435   GFRNONAA 24 (L) 12/24/2012 0435     No results found.     Assessment & Plan:   1. ESRD (end stage renal disease) (HCC) Recommend:  The patient is a good flow volume of her there is noted significant stenosis.  The anastomosis site as well as a competing branch with flow.  I suspect that with competing things this may also be affecting the ability for  the fistula to fully mature.  Based on this, patient should have a fistulagram with the intention for intervention.  The intention for intervention is to restore appropriate flow and prevent thrombosis and possible loss of the access.  As well as improve the quality of dialysis therapy.  The risks, benefits and alternative therapies were reviewed in detail with the patient.  All questions were answered.  The patient agrees to proceed with angio/intervention.  We also discussed that possible embolization of the additional branch may be necessary.  Patient will also need anesthesia for procedure.  The patient will follow up with me in the office after the procedure.   2. Diabetes mellitus without complication (HCC) Continue hypoglycemic medications as already ordered, these medications have been reviewed and there are no changes at this time.  Hgb A1C to be monitored as already arranged by primary service  3. Primary hypertension Continue antihypertensive medications as already ordered, these medications have been reviewed and there are no changes at this time.   Current Outpatient Medications on File Prior to Visit  Medication Sig Dispense Refill   albuterol (VENTOLIN HFA) 108 (90 Base) MCG/ACT inhaler Inhale 2 puffs into the lungs every 6 (six) hours as needed for wheezing or shortness of breath.     allopurinol (ZYLOPRIM) 300 MG tablet Take 300 mg by mouth daily.      amLODipine (NORVASC) 5 MG tablet Take 5 mg by mouth daily.  aspirin EC 81 MG tablet Take 81 mg by mouth daily.     atorvastatin (LIPITOR) 20 MG tablet Take 20 mg by mouth daily.     calcitRIOL (ROCALTROL) 0.25 MCG capsule Take 0.25 mcg by mouth daily.     colchicine 0.6 MG tablet Take 0.6 mg by mouth daily.     furosemide (LASIX) 40 MG tablet Take 40 mg by mouth daily as needed (for fluid retention (swollen ankles)---typically no more than twice a week). Takes on T, Th, Sat, Sun only if Diastolic is 60 or above      gabapentin (NEURONTIN) 100 MG capsule Take 100 mg by mouth 3 (three) times daily.     losartan (COZAAR) 100 MG tablet Take 100 mg by mouth daily.     metoprolol tartrate (LOPRESSOR) 50 MG tablet Take 50 mg by mouth 2 (two) times daily.     pantoprazole (PROTONIX) 40 MG tablet Take 40 mg by mouth 2 (two) times daily.     SYMBICORT 160-4.5 MCG/ACT inhaler Inhale 2 puffs into the lungs.     traMADol (ULTRAM) 50 MG tablet Take 1 tablet (50 mg total) by mouth every 6 (six) hours as needed. 20 tablet 0   triamcinolone cream (KENALOG) 0.1 % Apply 1 Application topically 2 (two) times daily.     No current facility-administered medications on file prior to visit.    There are no Patient Instructions on file for this visit. No follow-ups on file.   Georgiana Spinner, NP

## 2022-12-25 NOTE — H&P (View-Only) (Signed)
 Subjective:    Patient ID: Michelle Murillo, female    DOB: Nov 07, 1945, 77 y.o.   MRN: [id number] Chief Complaint  Patient presents with   Follow-up    Follow up in 6 weeks (12/19/2022) For wound re-check with HDA    Michelle Murillo is a 77 year old female who presents today for evaluation of her left brachiocephalic AV fistula created on 11/07/2022.  The patient had some tenderness near the incision area which is improved as she also had a relatively large hematoma.  That is also decreased and improved but is still slightly present.  She has a good thrill but the fistula is not obviously palpable.  Her duplex today also shows elevated velocities concerning for stenosis near the anastomosis site but there is also an additional branch which has flow present as well.    Review of Systems  All other systems reviewed and are negative.      Objective:   Physical Exam Vitals reviewed.  HENT:     Head: Normocephalic.  Cardiovascular:     Rate and Rhythm: Normal rate.     Pulses:          Radial pulses are 2+ on the left side.  Pulmonary:     Effort: Pulmonary effort is normal.  Musculoskeletal:        General: Tenderness present.  Skin:    General: Skin is warm and dry.  Neurological:     Mental Status: She is alert and oriented to person, place, and time.  Psychiatric:        Mood and Affect: Mood normal.        Behavior: Behavior normal.        Thought Content: Thought content normal.        Judgment: Judgment normal.     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  pt was in a coma for 18 days after  this.(during CABG)   COPD (chronic obstructive pulmonary disease) (HCC)    Coronary artery disease    a.) s/p 4v CABG 06/12/2011; b.) LHC 03/07/2020: 10% dRCA, 75% p-mRCA, 75% pLCx, 50% p-mLAD, 45% mLAD; SVG-RCA and SVG-OM occluded   Diabetes mellitus without complication (HCC)    borderline, does not take any medication   Dyspnea    ESRD on hemodialysis (HCC) 2019   a.) M-W-F   GERD (gastroesophageal reflux disease)    Gout    Hypertension    Legally blind    unable to see any more than shapes   Right ovarian cyst    S/P CABG x 4 06/12/2011   a.) LIMA-LAD, SVG-OM1, SVG-LCx, SVG-RCA --> procedure complicated by bradycardic arrest and respiratory failure (pulmonary edema) resulting in prolonged ICU admission. Developed rapid A.fib and required DCCV x 3 durrsing ICU admission.   Seizures (HCC) 2013   only happened during cabg procedure while on the table   Sleep apnea    a.) does not use noctural PAP therapy    Social History  Socioeconomic History   Marital status: Widowed    Spouse name: Not on file   Number of children: 2   Years of education: Not on file   Highest education level: Not on file  Occupational History   Not on file  Tobacco Use   Smoking status: Every Day    Current packs/day: 0.50    Types: Cigarettes   Smokeless tobacco: Never   Tobacco comments:    6-7 cigs per day  Vaping Use   Vaping status: Never Used  Substance and Sexual Activity   Alcohol use: No   Drug use: No   Sexual activity: Not on file  Other Topics Concern   Not on file  Social History Narrative   2 sons: 24 & 76 y.o. : lives by herself    Social Determinants of Health   Financial Resource Strain: Not on file  Food Insecurity: Not on file  Transportation Needs: Not on file  Physical Activity: Not on file  Stress: Not on file  Social Connections: Not on file  Intimate Partner Violence: Not on file    Past Surgical History:  Procedure Laterality Date   ABDOMINAL AORTOGRAM  N/A 06/23/2017   Procedure: ABDOMINAL AORTOGRAM;  Surgeon: Annice Needy, MD;  Location: [facility];  Service: Cardiovascular;  Laterality: N/A;   ABDOMINAL SURGERY  2017   aneurysm repair with dr. dew   AV FISTULA PLACEMENT Left 11/07/2022   Procedure: ARTERIOVENOUS (AV) FISTULA CREATION (BRACHIALCEPHALIC);  Surgeon: Annice Needy, MD;  Location: [facility];  Service: Vascular;  Laterality: Left;   BRONCHOSCOPY  2015   done at [facility]. carcinoid of airways   CHOLECYSTECTOMY     age 44   CORONARY ARTERY BYPASS GRAFT  06/12/2011   DIALYSIS/PERMA CATHETER INSERTION N/A 06/24/2022   Procedure: DIALYSIS/PERMA CATHETER INSERTION;  Surgeon: Annice Needy, MD;  Location: [facility];  Service: Cardiovascular;  Laterality: N/A;   DIALYSIS/PERMA CATHETER INSERTION N/A 09/03/2022   Procedure: DIALYSIS/PERMA CATHETER INSERTION;  Surgeon: Renford Dills, MD;  Location: [facility];  Service: Cardiovascular;  Laterality: N/A;   DIALYSIS/PERMA CATHETER INSERTION N/A 10/16/2022   Procedure: DIALYSIS/PERMA CATHETER INSERTION;  Surgeon: Renford Dills, MD;  Location: [facility];  Service: Cardiovascular;  Laterality: N/A;   DIALYSIS/PERMA CATHETER INSERTION N/A 10/03/2022   Procedure: DIALYSIS/PERMA CATHETER INSERTION;  Surgeon: Annice Needy, MD;  Location: [facility];  Service: Cardiovascular;  Laterality: N/A;   ENDOVASCULAR REPAIR/STENT GRAFT N/A 07/06/2018   Procedure: ENDOVASCULAR REPAIR/STENT GRAFT;  Surgeon: Annice Needy, MD;  Location: [facility];  Service: Cardiovascular;  Laterality: N/A;   laryngeal nodule surgery     LEFT HEART CATH AND CORS/GRAFTS ANGIOGRAPHY Left 03/07/2020   Procedure: LEFT HEART CATH AND CORS/GRAFTS ANGIOGRAPHY;  Surgeon: Lamar Blinks, MD;  Location: [facility];  Service: Cardiovascular;  Laterality: Left;   OOPHORECTOMY Right     Family History  Problem Relation Age of Onset   Congestive Heart  Failure Mother    Heart attack Mother    Cancer Father    Liver cancer Father    Cancer Paternal Grandfather     Allergies  Allergen Reactions   Isosorbide Nitrate Nausea And Vomiting   Bupropion Nausea Only    Other reaction(s): Other (See Comments) "I didn't do well with it.".hallucinates   Morphine And Codeine Itching    Per pt morphine only  Latest Ref Rng & Units 11/07/2022    6:36 AM 10/29/2022    2:55 PM 11/27/2014    5:19 PM  CBC  WBC 4.0 - 10.5 K/uL  4.4  5.9   Hemoglobin 12.0 - 15.0 g/dL 16.1  09.6  04.5   Hematocrit 36.0 - 46.0 % 37.0  35.9  38.1   Platelets 150 - 400 K/uL  138  134       CMP     Component Value Date/Time   NA 140 11/07/2022 0636   NA 145 04/07/2014 0435   K 4.9 11/07/2022 0636   K 4.5 04/07/2014 0435   CL 103 11/07/2022 0636   CL 108 (H) 04/07/2014 0435   CO2 29 10/29/2022 1455   CO2 31 04/07/2014 0435   GLUCOSE 100 (H) 11/07/2022 0636   GLUCOSE 90 04/07/2014 0435   BUN 29 (H) 11/07/2022 0636   BUN 25 (H) 04/07/2014 0435   CREATININE 3.50 (H) 11/07/2022 0636   CREATININE 1.95 (H) 04/07/2014 0435   CALCIUM 8.8 (L) 10/29/2022 1455   CALCIUM 7.8 (L) 04/07/2014 0435   PROT 7.1 11/27/2014 1719   PROT 5.8 (L) 04/07/2014 0435   ALBUMIN 4.0 11/27/2014 1719   ALBUMIN 2.7 (L) 04/07/2014 0435   AST 19 11/27/2014 1719   AST 29 04/07/2014 0435   ALT 15 11/27/2014 1719   ALT 32 04/07/2014 0435   ALKPHOS 76 11/27/2014 1719   ALKPHOS 61 04/07/2014 0435   BILITOT 0.4 11/27/2014 1719   BILITOT 0.3 04/07/2014 0435   GFRNONAA 16 (L) 10/29/2022 1455   GFRNONAA 27 (L) 04/07/2014 0435   GFRNONAA 24 (L) 12/24/2012 0435     No results found.     Assessment & Plan:   1. ESRD (end stage renal disease) (HCC) Recommend:  The patient is a good flow volume of her there is noted significant stenosis.  The anastomosis site as well as a competing branch with flow.  I suspect that with competing things this may also be affecting the ability for  the fistula to fully mature.  Based on this, patient should have a fistulagram with the intention for intervention.  The intention for intervention is to restore appropriate flow and prevent thrombosis and possible loss of the access.  As well as improve the quality of dialysis therapy.  The risks, benefits and alternative therapies were reviewed in detail with the patient.  All questions were answered.  The patient agrees to proceed with angio/intervention.  We also discussed that possible embolization of the additional branch may be necessary.  Patient will also need anesthesia for procedure.  The patient will follow up with me in the office after the procedure.   2. Diabetes mellitus without complication (HCC) Continue hypoglycemic medications as already ordered, these medications have been reviewed and there are no changes at this time.  Hgb A1C to be monitored as already arranged by primary service  3. Primary hypertension Continue antihypertensive medications as already ordered, these medications have been reviewed and there are no changes at this time.   Current Outpatient Medications on File Prior to Visit  Medication Sig Dispense Refill   albuterol (VENTOLIN HFA) 108 (90 Base) MCG/ACT inhaler Inhale 2 puffs into the lungs every 6 (six) hours as needed for wheezing or shortness of breath.     allopurinol (ZYLOPRIM) 300 MG tablet Take 300 mg by mouth daily.      amLODipine (NORVASC) 5 MG tablet Take 5 mg by mouth daily.  aspirin EC 81 MG tablet Take 81 mg by mouth daily.     atorvastatin (LIPITOR) 20 MG tablet Take 20 mg by mouth daily.     calcitRIOL (ROCALTROL) 0.25 MCG capsule Take 0.25 mcg by mouth daily.     colchicine 0.6 MG tablet Take 0.6 mg by mouth daily.     furosemide (LASIX) 40 MG tablet Take 40 mg by mouth daily as needed (for fluid retention (swollen ankles)---typically no more than twice a week). Takes on T, Th, Sat, Sun only if Diastolic is 60 or above      gabapentin (NEURONTIN) 100 MG capsule Take 100 mg by mouth 3 (three) times daily.     losartan (COZAAR) 100 MG tablet Take 100 mg by mouth daily.     metoprolol tartrate (LOPRESSOR) 50 MG tablet Take 50 mg by mouth 2 (two) times daily.     pantoprazole (PROTONIX) 40 MG tablet Take 40 mg by mouth 2 (two) times daily.     SYMBICORT 160-4.5 MCG/ACT inhaler Inhale 2 puffs into the lungs.     traMADol (ULTRAM) 50 MG tablet Take 1 tablet (50 mg total) by mouth every 6 (six) hours as needed. 20 tablet 0   triamcinolone cream (KENALOG) 0.1 % Apply 1 Application topically 2 (two) times daily.     No current facility-administered medications on file prior to visit.    There are no Patient Instructions on file for this visit. No follow-ups on file.   Georgiana Spinner, NP

## 2023-01-06 ENCOUNTER — Telehealth (INDEPENDENT_AMBULATORY_CARE_PROVIDER_SITE_OTHER): Payer: Self-pay

## 2023-01-06 NOTE — Telephone Encounter (Signed)
Spoke with the patient and she is scheduled with Dr. Wyn Quaker on 01/14/23 with a 11:00 am arrival time to the [facility] for a left arm fistulagram. Pre-procedure instructions were discussed and will be mailed.

## 2023-01-10 NOTE — Anesthesia Preprocedure Evaluation (Addendum)
Anesthesia Evaluation  Patient identified by MRN, date of birth, ID band Patient awake    Reviewed: Allergy & Precautions, NPO status , Patient's Chart, lab work & pertinent test results, reviewed documented beta blocker date and time   History of Anesthesia Complications (+) DIFFICULT AIRWAY and history of anesthetic complications  Airway Mallampati: III  TM Distance: >3 FB Neck ROM: full    Dental  (+) Chipped, Edentulous Lower, Edentulous Upper, Dental Advidsory Given   Pulmonary shortness of breath and at rest, sleep apnea and Continuous Positive Airway Pressure Ventilation , COPD,  COPD inhaler, Current Smoker and Patient abstained from smoking.   Pulmonary exam normal        Cardiovascular hypertension, Pt. on medications and Pt. on home beta blockers + CAD, + CABG (s/p 4v CABG 06/12/2011; b.) LHC 03/07/2020: 10% dRCA, 75% p-mRCA, 75% pLCx, 50% p-mLAD, 45% mLAD; SVG-RCA and SVG-OM occluded), + Peripheral Vascular Disease (s/p EVAR 06/2018) and +CHF  + dysrhythmias Atrial Fibrillation   2013 CABG LIMA-LAD, SVG-OM1, SVG-LCx, SVG-RCA --> procedure complicated by bradycardic arrest and respiratory failure (pulmonary edema) resulting in prolonged ICU admission. Developed rapid A.fib and required DCCV x 3 durrsing ICU admission.  EKG 6/24: RBBB, LVH  LHC 10/21: Assessment Continued severe tortuosity of blood vessels with occlusion of graft to right coronary artery circumflex artery and patent graft to left anterior descending artery with continued concerns for anginal symptoms Plan Maximize medication management for anginal symptoms and three-vessel coronary artery disease with previous coronary bypass graft which have failed and continued moderate atherosclerosis not easily amenable to PCI and stent placement due to severe size tortuosity at this time.  Further treatment options after maximizing medication management  MPS  9/21: LVEF= 55%  FINDINGS:  Regional wall motion:  reveals normal myocardial thickening and wall  motion.  The overall quality of the study is poor.   Artifacts noted: yes  Left ventricular cavity: normal.  Perfusion Analysis:  SPECT images demonstrate homogeneous tracer  distribution throughout the myocardium. Defect type : Normal       Neuro/Psych Well Controlled,  Legally blind  negative psych ROS   GI/Hepatic Neg liver ROS,GERD  Controlled and Medicated,,  Endo/Other  negative endocrine ROSdiabetes, Type 2    Renal/GU CRF and DialysisRenal disease     Musculoskeletal  (+) Arthritis  (Gout),    Abdominal   Peds  Hematology  (+) Blood dyscrasia, anemia thrombocytopenia   Anesthesia Other Findings Past Medical History: H/o laryngeal nodule surgery No date: Abdominal aneurysm (HCC)     Comment:  a.) s/p EVAR 06/2018 No date: Cancer [facility])     Comment:  carcinoid of airway with branches into left lower lobe               of lung No date: CHF (congestive heart failure) (HCC) 2013: Complication of anesthesia     Comment:  "stops breathing and goes into convulsions".  pt was in               a coma for 18 days after this.(during CABG) No date: COPD (chronic obstructive pulmonary disease) (HCC) No date: Coronary artery disease     Comment:  a.) s/p 4v CABG 06/12/2011; b.) LHC 03/07/2020: 10%               dRCA, 75% p-mRCA, 75% pLCx, 50% p-mLAD, 45% mLAD; SVG-RCA              and SVG-OM occluded No date: Diabetes mellitus  without complication (HCC)     Comment:  borderline, does not take any medication No date: Dyspnea 2019: ESRD on hemodialysis (HCC)     Comment:  a.) M-W-F No date: GERD (gastroesophageal reflux disease) No date: Gout No date: Hypertension No date: Legally blind     Comment:  unable to see any more than shapes No date: Right ovarian cyst 06/12/2011: S/P CABG x 4     Comment:  a.) LIMA-LAD, SVG-OM1, SVG-LCx, SVG-RCA --> procedure                complicated by bradycardic arrest and respiratory failure              (pulmonary edema) resulting in prolonged ICU admission.               Developed rapid A.fib and required DCCV x 3 durrsing ICU               admission. 2013: Seizures (HCC)     Comment:  only happened during cabg procedure while on the table No date: Sleep apnea     Comment:  a.) does not use noctural PAP therapy  Past Surgical History: 06/23/2017: ABDOMINAL AORTOGRAM; N/A     Comment:  Procedure: ABDOMINAL AORTOGRAM;  Surgeon: Annice Needy,               MD;  Location: [facility];  Service:               Cardiovascular;  Laterality: N/A; 2017: ABDOMINAL SURGERY     Comment:  aneurysm repair with dr. dew 2015: BRONCHOSCOPY     Comment:  done at [facility]. carcinoid of airways No date: CHOLECYSTECTOMY     Comment:  age 28 06/12/2011: CORONARY ARTERY BYPASS GRAFT 06/24/2022: DIALYSIS/PERMA CATHETER INSERTION; N/A     Comment:  Procedure: DIALYSIS/PERMA CATHETER INSERTION;  Surgeon:               Annice Needy, MD;  Location: [facility];                Service: Cardiovascular;  Laterality: N/A; 09/03/2022: DIALYSIS/PERMA CATHETER INSERTION; N/A     Comment:  Procedure: DIALYSIS/PERMA CATHETER INSERTION;  Surgeon:               Renford Dills, MD;  Location: [facility];               Service: Cardiovascular;  Laterality: N/A; 10/16/2022: DIALYSIS/PERMA CATHETER INSERTION; N/A     Comment:  Procedure: DIALYSIS/PERMA CATHETER INSERTION;  Surgeon:               Renford Dills, MD;  Location: [facility];               Service: Cardiovascular;  Laterality: N/A; 10/03/2022: DIALYSIS/PERMA CATHETER INSERTION; N/A     Comment:  Procedure: DIALYSIS/PERMA CATHETER INSERTION;  Surgeon:               Annice Needy, MD;  Location: [facility];                Service: Cardiovascular;  Laterality: N/A; 07/06/2018: ENDOVASCULAR REPAIR/STENT GRAFT; N/A     Comment:  Procedure:  ENDOVASCULAR REPAIR/STENT GRAFT;  Surgeon:               Annice Needy, MD;  Location: [facility];  Service: Cardiovascular;  Laterality: N/A; No date: laryngeal nodule surgery 03/07/2020: LEFT HEART CATH AND CORS/GRAFTS ANGIOGRAPHY; Left     Comment:  Procedure: LEFT HEART CATH AND CORS/GRAFTS ANGIOGRAPHY;               Surgeon: Lamar Blinks, MD;  Location: [facility]               [facility];  Service: Cardiovascular;  Laterality: Left; No date: OOPHORECTOMY; Right  BMI    Body Mass Index: 33.92 kg/m      Reproductive/Obstetrics negative OB ROS                              Anesthesia Physical Anesthesia Plan  ASA:   Anesthesia Plan: MAC   Post-op Pain Management:    Induction: Intravenous  PONV Risk Score and Plan: 0 and Ondansetron and Treatment may vary due to age or medical condition  Airway Management Planned: Natural Airway  Additional Equipment:   Intra-op Plan:   Post-operative Plan:   Informed Consent:   Plan Discussed with:   Anesthesia Plan Comments:          Anesthesia Quick Evaluation

## 2023-01-13 ENCOUNTER — Other Ambulatory Visit: Payer: Self-pay

## 2023-01-13 ENCOUNTER — Ambulatory Visit: Payer: Medicare HMO | Admitting: Anesthesiology

## 2023-01-13 ENCOUNTER — Ambulatory Visit
Admission: RE | Admit: 2023-01-13 | Discharge: 2023-01-13 | Disposition: A | Discharge status: Home / Self Care | Payer: Medicare HMO | Attending: Vascular Surgery | Admitting: Vascular Surgery

## 2023-01-13 ENCOUNTER — Encounter: Payer: Self-pay | Admitting: Vascular Surgery

## 2023-01-13 ENCOUNTER — Encounter
Admission: RE | Disposition: A | Discharge status: Home / Self Care | Payer: Self-pay | Source: Home / Self Care | Attending: Vascular Surgery

## 2023-01-13 DIAGNOSIS — E1122 Type 2 diabetes mellitus with diabetic chronic kidney disease: Secondary | ICD-10-CM | POA: Diagnosis not present

## 2023-01-13 DIAGNOSIS — I132 Hypertensive heart and chronic kidney disease with heart failure and with stage 5 chronic kidney disease, or end stage renal disease: Secondary | ICD-10-CM | POA: Insufficient documentation

## 2023-01-13 DIAGNOSIS — Z992 Dependence on renal dialysis: Secondary | ICD-10-CM

## 2023-01-13 DIAGNOSIS — T82898A Other specified complication of vascular prosthetic devices, implants and grafts, initial encounter: Secondary | ICD-10-CM

## 2023-01-13 DIAGNOSIS — N186 End stage renal disease: Secondary | ICD-10-CM

## 2023-01-13 DIAGNOSIS — T82590A Other mechanical complication of surgically created arteriovenous fistula, initial encounter: Secondary | ICD-10-CM | POA: Insufficient documentation

## 2023-01-13 DIAGNOSIS — F17210 Nicotine dependence, cigarettes, uncomplicated: Secondary | ICD-10-CM | POA: Insufficient documentation

## 2023-01-13 DIAGNOSIS — I509 Heart failure, unspecified: Secondary | ICD-10-CM | POA: Insufficient documentation

## 2023-01-13 DIAGNOSIS — Y841 Kidney dialysis as the cause of abnormal reaction of the patient, or of later complication, without mention of misadventure at the time of the procedure: Secondary | ICD-10-CM | POA: Diagnosis not present

## 2023-01-13 HISTORY — PX: A/V FISTULAGRAM: CATH118298

## 2023-01-13 LAB — GLUCOSE, CAPILLARY: Glucose-Capillary: 75 mg/dL (ref 70–99)

## 2023-01-13 LAB — POTASSIUM (ARMC VASCULAR LAB ONLY): Potassium (ARMC vascular lab): 4.6 mmol/L (ref 3.5–5.1)

## 2023-01-13 SURGERY — A/V FISTULAGRAM
Anesthesia: Monitor Anesthesia Care | Laterality: Left

## 2023-01-13 MED ORDER — CEFAZOLIN SODIUM-DEXTROSE 1-4 GM/50ML-% IV SOLN: 1.0000 g | INTRAVENOUS | Status: DC

## 2023-01-13 MED ORDER — METHYLPREDNISOLONE SODIUM SUCC 125 MG IJ SOLR: 125.0000 mg | Freq: Once | INTRAMUSCULAR | Status: DC | PRN

## 2023-01-13 MED ORDER — MIDAZOLAM HCL 2 MG/2ML IJ SOLN
INTRAMUSCULAR | Status: AC
  Filled 2023-01-13: qty 2

## 2023-01-13 MED ORDER — FENTANYL CITRATE (PF) 100 MCG/2ML IJ SOLN
INTRAMUSCULAR | Status: AC
  Filled 2023-01-13: qty 2

## 2023-01-13 MED ORDER — ONDANSETRON HCL 4 MG/2ML IJ SOLN: 4.0000 mg | Freq: Four times a day (QID) | INTRAMUSCULAR | Status: DC | PRN

## 2023-01-13 MED ORDER — DIPHENHYDRAMINE HCL 50 MG/ML IJ SOLN: 50.0000 mg | Freq: Once | INTRAMUSCULAR | Status: DC | PRN

## 2023-01-13 MED ORDER — HYDROMORPHONE HCL 1 MG/ML IJ SOLN: 1.0000 mg | Freq: Once | INTRAMUSCULAR | Status: DC | PRN

## 2023-01-13 MED ORDER — MIDAZOLAM HCL 2 MG/ML PO SYRP: 8.0000 mg | ORAL_SOLUTION | Freq: Once | ORAL | Status: DC | PRN

## 2023-01-13 MED ORDER — LIDOCAINE-EPINEPHRINE (PF) 1 %-1:200000 IJ SOLN
INTRAMUSCULAR | Status: DC | PRN
  Administered 2023-01-13: 10 mL

## 2023-01-13 MED ORDER — FENTANYL CITRATE (PF) 100 MCG/2ML IJ SOLN
INTRAMUSCULAR | Status: DC | PRN
  Administered 2023-01-13 (×4): 25 ug via INTRAVENOUS

## 2023-01-13 MED ORDER — HEPARIN (PORCINE) IN NACL 1000-0.9 UT/500ML-% IV SOLN
INTRAVENOUS | Status: DC | PRN
  Administered 2023-01-13: 1000 mL

## 2023-01-13 MED ORDER — IODIXANOL 320 MG/ML IV SOLN
INTRAVENOUS | Status: DC | PRN
  Administered 2023-01-13: 30 mL

## 2023-01-13 MED ORDER — HEPARIN SODIUM (PORCINE) 1000 UNIT/ML IJ SOLN
INTRAMUSCULAR | Status: DC | PRN
  Administered 2023-01-13: 3000 [IU] via INTRAVENOUS

## 2023-01-13 MED ORDER — SODIUM CHLORIDE 0.9 % IV SOLN: INTRAVENOUS | Status: DC

## 2023-01-13 MED ORDER — FAMOTIDINE 20 MG PO TABS: 40.0000 mg | ORAL_TABLET | Freq: Once | ORAL | Status: DC | PRN

## 2023-01-13 MED ORDER — HEPARIN SODIUM (PORCINE) 1000 UNIT/ML IJ SOLN
INTRAMUSCULAR | Status: AC
  Filled 2023-01-13: qty 10

## 2023-01-13 MED ORDER — MIDAZOLAM HCL 2 MG/2ML IJ SOLN
INTRAMUSCULAR | Status: DC | PRN
  Administered 2023-01-13 (×2): 1 mg via INTRAVENOUS

## 2023-01-13 SURGICAL SUPPLY — 17 items
CATH MICROCATH PRGRT 2.8F 110 (CATHETERS) IMPLANT
CATH TEMPO 5F RIM 65CM (CATHETERS) IMPLANT
COIL 400 COMPLEX STD 5X20CM (Vascular Products) IMPLANT
COVER PROBE ULTRASOUND 5X96 (MISCELLANEOUS) IMPLANT
DEVICE OCCLUSION POD4 (Vascular Products) IMPLANT
DEVICE OCCLUSION PODJ30 (Vascular Products) IMPLANT
GLIDEWIRE STIFF .35X180X3 HYDR (WIRE) IMPLANT
HANDLE DETACHMENT COIL (MISCELLANEOUS) IMPLANT
KIT MICROPUNCTURE NIT STIFF (SHEATH) IMPLANT
MICROCATH PROGREAT 2.8F 110 CM (CATHETERS) ×1
NDL ENTRY 21GA 7CM ECHOTIP (NEEDLE) IMPLANT
NEEDLE ENTRY 21GA 7CM ECHOTIP (NEEDLE) ×1 IMPLANT
OCCLUSION DEVICE POD4 (Vascular Products) ×1 IMPLANT
OCCLUSION DEVICE PODJ30 (Vascular Products) ×1 IMPLANT
PACK ANGIOGRAPHY (CUSTOM PROCEDURE TRAY) ×1 IMPLANT
SHEATH BRITE TIP 6FRX5.5 (SHEATH) IMPLANT
SUT MNCRL AB 4-0 PS2 18 (SUTURE) IMPLANT

## 2023-01-13 NOTE — Transfer of Care (Signed)
Immediate Anesthesia Transfer of Care Note  Patient: Michelle Murillo  Procedure(s) Performed: A/V Fistulagram (Left)  Patient Location: PACU  Anesthesia Type:MAC  Level of Consciousness: awake, alert , and oriented  Airway & Oxygen Therapy: Patient Spontanous Breathing  Post-op Assessment: Report given to RN and Post -op Vital signs reviewed and stable  Post vital signs: stable  Last Vitals:  Vitals Value Taken Time  BP    Temp    Pulse    Resp    SpO2      Last Pain:  Vitals:   01/13/23 1144  PainSc: 0-No pain         Complications:  Encounter Notable Events  Notable Event Outcome Phase Comment  None  Intraprocedure

## 2023-01-13 NOTE — Anesthesia Postprocedure Evaluation (Signed)
Anesthesia Post Note  Patient: Starley Jezewski  Procedure(s) Performed: A/V Fistulagram (Left)  Patient location during evaluation: PACU Anesthesia Type: MAC Level of consciousness: awake and alert Pain management: pain level controlled Vital Signs Assessment: post-procedure vital signs reviewed and stable Respiratory status: spontaneous breathing, nonlabored ventilation and respiratory function stable Cardiovascular status: blood pressure returned to baseline and stable Postop Assessment: no apparent nausea or vomiting Anesthetic complications: no   Encounter Notable Events  Notable Event Outcome Phase Comment  None  Intraprocedure      Last Vitals:  Vitals:   01/13/23 1400 01/13/23 1415  BP: (!) 141/49 (!) 119/95  Resp: 20 17  SpO2:      Last Pain:  Vitals:   01/13/23 1144  PainSc: 0-No pain                 Foye Deer

## 2023-01-13 NOTE — Op Note (Signed)
[facility]    OPERATIVE NOTE   PROCEDURE: 1.   Left brachiocephalic arteriovenous fistula cannulation under ultrasound guidance 2.   Left arm fistulagram including central venogram 3.   Coil embolization of a large left arm cephalic vein branch with a total of 3 Ruby coils  PRE-OPERATIVE DIAGNOSIS: 1. ESRD 2. Poorly functional left brachiocephalic AVF  POST-OPERATIVE DIAGNOSIS: same as above   SURGEON: Festus Barren, MD  ANESTHESIA: local with MCS  ESTIMATED BLOOD LOSS: 3 cc  FINDING(S): Patent left brachiocephalic AV fistula without any significant stenosis.  Mild narrowing at the anastomosis and the cephalic vein subclavian vein confluence, but neither of these were flow-limiting.  Central venous circulation was patent.  A large competing branch was present in the mid upper arm cephalic vein.  SPECIMEN(S):  None  CONTRAST: 30 cc  FLUORO TIME: 1.8 minutes  ANESTHESIA: local with MCS by anesthesia   INDICATIONS: Michelle Murillo is a 77 y.o. female who presents with malfunctioning left brachiocephalic arteriovenous fistula.  The patient is scheduled for left arm fistulagram.  The patient is aware the risks include but are not limited to: bleeding, infection, thrombosis of the cannulated access, and possible anaphylactic reaction to the contrast.  The patient is aware of the risks of the procedure and elects to proceed forward.  DESCRIPTION: After full informed written consent was obtained, the patient was brought back to the angiography suite and placed supine upon the angiography table.  The patient was connected to monitoring equipment.  Anesthesia provided moderate conscious sedation.  The left arm was prepped and draped in the standard fashion for a percutaneous access intervention.  Under ultrasound guidance, the left brachiocephalic arteriovenous fistula was cannulated with a micropuncture needle under direct ultrasound guidance where it was patent  and a permanent image was performed.  The microwire was advanced into the fistula and the needle was exchanged for the a microsheath.  I then upsized to a 6 Fr Sheath and imaging was performed.  Hand injections were completed to image the access including the central venous system. This demonstrated a patent left brachiocephalic AV fistula without any significant stenosis.  Mild narrowing at the anastomosis and the cephalic vein subclavian vein confluence, but neither of these were flow-limiting.  Central venous circulation was patent.  A large competing branch was present in the mid upper arm cephalic vein.  Based on the images, this patient will need embolization of the large competing branch. I then gave the patient 3000 units of intravenous heparin.  I then used a rim catheter and a Glidewire to cannulate the large competing branch.  Selective imaging was performed through the rim catheter showing the large branch and its primary bifurcation.  I then used a prograde microcatheter and got out to its primary bifurcation and began by placing a POD4 Ruby coil which was 30 cm in length.  A 30 cm packing coil followed by 5 mm x 20 cm standard coil was then placed.  Coil embolization encompassed both of the primary branches of this large competing branch and then went back into the competing branch about 2 to 3 cm from its origin.  Successful embolization was achieved with imaging through the rim catheter.  Based on the completion imaging, no further intervention is necessary.  All other branches off of the cephalic vein were tiny and would not require embolization at least at this time.  A 4-0 Monocryl purse-string suture was sewn around the sheath.  The sheath  was removed while tying down the suture.  A sterile bandage was applied to the puncture site.  COMPLICATIONS: None  CONDITION: Stable   Festus Barren  01/13/2023 1:28 PM   This note was created with Dragon Medical transcription system. Any errors in  dictation are purely unintentional.

## 2023-01-13 NOTE — Interval H&P Note (Signed)
History and Physical Interval Note:  01/13/2023 11:06 AM  Michelle Murillo  has presented today for surgery, with the diagnosis of L arm fistulagram   ANESTHESIA    End Stage Renal.  The various methods of treatment have been discussed with the patient and family. After consideration of risks, benefits and other options for treatment, the patient has consented to  Procedure(s): A/V Fistulagram (Left) as a surgical intervention.  The patient's history has been reviewed, patient examined, no change in status, stable for surgery.  I have reviewed the patient's chart and labs.  Questions were answered to the patient's satisfaction.     Festus Barren

## 2023-01-14 ENCOUNTER — Encounter: Payer: Self-pay | Admitting: Vascular Surgery

## 2023-01-15 ENCOUNTER — Encounter: Payer: Self-pay | Admitting: Vascular Surgery

## 2023-01-28 ENCOUNTER — Other Ambulatory Visit (HOSPITAL_COMMUNITY): Payer: Self-pay | Admitting: Pulmonary Disease

## 2023-01-28 DIAGNOSIS — R911 Solitary pulmonary nodule: Secondary | ICD-10-CM

## 2023-02-10 ENCOUNTER — Ambulatory Visit
Admission: RE | Admit: 2023-02-10 | Discharge: 2023-02-10 | Disposition: A | Discharge status: Home / Self Care | Payer: Medicare HMO | Source: Ambulatory Visit | Attending: Pulmonary Disease | Admitting: Pulmonary Disease

## 2023-02-10 DIAGNOSIS — R911 Solitary pulmonary nodule: Secondary | ICD-10-CM | POA: Diagnosis present

## 2023-02-18 ENCOUNTER — Other Ambulatory Visit (INDEPENDENT_AMBULATORY_CARE_PROVIDER_SITE_OTHER): Payer: Self-pay | Admitting: Vascular Surgery

## 2023-02-18 DIAGNOSIS — N186 End stage renal disease: Secondary | ICD-10-CM

## 2023-02-20 ENCOUNTER — Encounter (INDEPENDENT_AMBULATORY_CARE_PROVIDER_SITE_OTHER): Payer: Self-pay | Admitting: Nurse Practitioner

## 2023-02-20 ENCOUNTER — Ambulatory Visit (INDEPENDENT_AMBULATORY_CARE_PROVIDER_SITE_OTHER): Payer: Medicare HMO | Admitting: Nurse Practitioner

## 2023-02-20 ENCOUNTER — Ambulatory Visit (INDEPENDENT_AMBULATORY_CARE_PROVIDER_SITE_OTHER): Payer: Medicare HMO

## 2023-02-20 DIAGNOSIS — Z992 Dependence on renal dialysis: Secondary | ICD-10-CM | POA: Diagnosis not present

## 2023-02-20 DIAGNOSIS — N186 End stage renal disease: Secondary | ICD-10-CM

## 2023-02-20 DIAGNOSIS — I10 Essential (primary) hypertension: Secondary | ICD-10-CM

## 2023-02-20 DIAGNOSIS — E119 Type 2 diabetes mellitus without complications: Secondary | ICD-10-CM | POA: Diagnosis not present

## 2023-02-24 ENCOUNTER — Encounter (INDEPENDENT_AMBULATORY_CARE_PROVIDER_SITE_OTHER): Payer: Self-pay | Admitting: Nurse Practitioner

## 2023-02-24 NOTE — Progress Notes (Signed)
Subjective:    Patient ID: Michelle Murillo, female    DOB: [birth year], 77 y.o.   MRN: [id number] Chief Complaint  Patient presents with   Follow-up    [facility] 4 week with HDA    The patient returns to the office for followup status post intervention of their dialysis access left brachiocephalic AV fistula on 11/07/2022.   The patient underwent a fistulogram and coil embolization of large left arm cephalic vein branch with 3 Ruby coils.  This was to help and aid with maturation of the fistula.  No recent shortening of the patient's walking distance or new symptoms consistent with claudication.  No history of rest pain symptoms. No new ulcers or wounds of the lower extremities have occurred.  The patient denies amaurosis fugax or recent TIA symptoms. There are no recent neurological changes noted. There is no history of DVT, PE or superficial thrombophlebitis. No recent episodes of angina or shortness of breath documented.   Duplex ultrasound of the AV access shows a patent access.  The previously noted stenosis is improved compared to last study.  Flow volume today is 1805 cc/min (previous flow volume was 2503 cc/min)       Review of Systems  All other systems reviewed and are negative.      Objective:   Physical Exam Vitals reviewed.  HENT:     Head: Normocephalic.  Cardiovascular:     Rate and Rhythm: Normal rate.     Pulses:          Radial pulses are 2+ on the left side.     Arteriovenous access: Left arteriovenous access is present.    Comments: Good thrill and bruit Pulmonary:     Effort: Pulmonary effort is normal.  Skin:    General: Skin is warm and dry.  Neurological:     Mental Status: She is alert and oriented to person, place, and time.  Psychiatric:        Mood and Affect: Mood normal.        Behavior: Behavior normal.        Thought Content: Thought content normal.        Judgment: Judgment normal.     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  pt was in a coma for 18 days after this.(during CABG)   COPD (chronic obstructive pulmonary disease) (HCC)    Coronary artery disease    a.) s/p 4v CABG 06/12/2011; b.) LHC 03/07/2020: 10% dRCA, 75% p-mRCA, 75% pLCx, 50% p-mLAD, 45% mLAD; SVG-RCA and SVG-OM occluded   Diabetes mellitus without complication (HCC)    borderline, does not take any medication   Dyspnea    ESRD on hemodialysis (HCC) 2019   a.) M-W-F   GERD (gastroesophageal reflux disease)    Gout    Hypertension    Legally blind    unable to see any more than shapes   Right ovarian cyst    S/P CABG x 4 06/12/2011   a.) LIMA-LAD, SVG-OM1, SVG-LCx, SVG-RCA -->  procedure complicated by bradycardic arrest and respiratory failure (pulmonary edema) resulting in prolonged ICU admission. Developed rapid A.fib and required DCCV x 3 durrsing ICU admission.   Seizures (HCC) 2013   only happened during cabg procedure while on the table   Sleep apnea    a.) does not use noctural PAP therapy    Social History   Socioeconomic History   Marital status: Widowed    Spouse name: Not on file   Number of children: 2   Years of education: Not on file   Highest education level: Not on file  Occupational History   Not on file  Tobacco Use   Smoking status: Every Day    Current packs/day: 0.50    Types: Cigarettes   Smokeless tobacco: Never   Tobacco comments:    6-7 cigs per day  Vaping Use   Vaping status: Never Used  Substance and Sexual Activity   Alcohol use: No   Drug use: No   Sexual activity: Not on file  Other Topics Concern   Not on file  Social History  Narrative   2 sons: 24 & 93 y.o. : lives by herself    Social Determinants of Health   Financial Resource Strain: Not on file  Food Insecurity: Not on file  Transportation Needs: Not on file  Physical Activity: Not on file  Stress: Not on file  Social Connections: Not on file  Intimate Partner Violence: Not on file    Past Surgical History:  Procedure Laterality Date   A/V FISTULAGRAM Left 01/13/2023   Procedure: A/V Fistulagram;  Surgeon: Annice Needy, MD;  Location: [facility];  Service: Cardiovascular;  Laterality: Left;   ABDOMINAL AORTOGRAM N/A 06/23/2017   Procedure: ABDOMINAL AORTOGRAM;  Surgeon: Annice Needy, MD;  Location: [facility];  Service: Cardiovascular;  Laterality: N/A;   ABDOMINAL SURGERY  2017   aneurysm repair with dr. dew   AV FISTULA PLACEMENT Left 11/07/2022   Procedure: ARTERIOVENOUS (AV) FISTULA CREATION (BRACHIALCEPHALIC);  Surgeon: Annice Needy, MD;  Location: [facility] OR[facility];  Service: Vascular;  Laterality: Left;   BRONCHOSCOPY  2015   done at [facility]. carcinoid of airways   CHOLECYSTECTOMY     age 43   CORONARY ARTERY BYPASS GRAFT  06/12/2011   DIALYSIS/PERMA CATHETER INSERTION N/A 06/24/2022   Procedure: DIALYSIS/PERMA CATHETER INSERTION;  Surgeon: Annice Needy, MD;  Location: [facility];  Service: Cardiovascular;  Laterality: N/A;   DIALYSIS/PERMA CATHETER INSERTION N/A 09/03/2022   Procedure: DIALYSIS/PERMA CATHETER INSERTION;  Surgeon: Renford Dills, MD;  Location: [facility];  Service: Cardiovascular;  Laterality: N/A;   DIALYSIS/PERMA CATHETER INSERTION N/A 10/16/2022   Procedure: DIALYSIS/PERMA CATHETER INSERTION;  Surgeon: Renford Dills, MD;  Location: [facility];  Service: Cardiovascular;  Laterality: N/A;   DIALYSIS/PERMA CATHETER INSERTION N/A 10/03/2022   Procedure: DIALYSIS/PERMA CATHETER INSERTION;  Surgeon: Annice Needy, MD;  Location: [facility];  Service: Cardiovascular;   Laterality: N/A;   ENDOVASCULAR REPAIR/STENT GRAFT N/A 07/06/2018   Procedure: ENDOVASCULAR REPAIR/STENT GRAFT;  Surgeon: Annice Needy, MD;  Location: [facility];  Service: Cardiovascular;  Laterality: N/A;   laryngeal nodule surgery     LEFT HEART CATH AND CORS/GRAFTS ANGIOGRAPHY Left 03/07/2020   Procedure: LEFT HEART CATH AND CORS/GRAFTS ANGIOGRAPHY;  Surgeon: Lamar Blinks, MD;  Location: [facility];  Service: Cardiovascular;  Laterality: Left;   OOPHORECTOMY Right  Family History  Problem Relation Age of Onset   Congestive Heart Failure Mother    Heart attack Mother    Cancer Father    Liver cancer Father    Cancer Paternal Grandfather     Allergies  Allergen Reactions   Isosorbide Nitrate Nausea And Vomiting   Bupropion Nausea Only    Other reaction(s): Other (See Comments) "I didn't do well with it.".hallucinates   Morphine And Codeine Itching    Per pt morphine only       Latest Ref Rng & Units 11/07/2022    6:36 AM 10/29/2022    2:55 PM 11/27/2014    5:19 PM  CBC  WBC 4.0 - 10.5 K/uL  4.4  5.9   Hemoglobin 12.0 - 15.0 g/dL 36.6  44.0  34.7   Hematocrit 36.0 - 46.0 % 37.0  35.9  38.1   Platelets 150 - 400 K/uL  138  134       CMP     Component Value Date/Time   NA 140 11/07/2022 0636   NA 145 04/07/2014 0435   K 4.9 11/07/2022 0636   K 4.5 04/07/2014 0435   CL 103 11/07/2022 0636   CL 108 (H) 04/07/2014 0435   CO2 29 10/29/2022 1455   CO2 31 04/07/2014 0435   GLUCOSE 100 (H) 11/07/2022 0636   GLUCOSE 90 04/07/2014 0435   BUN 29 (H) 11/07/2022 0636   BUN 25 (H) 04/07/2014 0435   CREATININE 3.50 (H) 11/07/2022 0636   CREATININE 1.95 (H) 04/07/2014 0435   CALCIUM 8.8 (L) 10/29/2022 1455   CALCIUM 7.8 (L) 04/07/2014 0435   PROT 7.1 11/27/2014 1719   PROT 5.8 (L) 04/07/2014 0435   ALBUMIN 4.0 11/27/2014 1719   ALBUMIN 2.7 (L) 04/07/2014 0435   AST 19 11/27/2014 1719   AST 29 04/07/2014 0435   ALT 15 11/27/2014 1719   ALT 32  04/07/2014 0435   ALKPHOS 76 11/27/2014 1719   ALKPHOS 61 04/07/2014 0435   BILITOT 0.4 11/27/2014 1719   BILITOT 0.3 04/07/2014 0435   GFRNONAA 16 (L) 10/29/2022 1455   GFRNONAA 27 (L) 04/07/2014 0435   GFRNONAA 24 (L) 12/24/2012 0435     No results found.     Assessment & Plan:   1. ESRD on dialysis [facility]) Recommend:  The patient is doing well and currently has adequate dialysis access.  We will send a letter to the patient's dialysis center to indicate that it is appropriate for her to begin utilizing her access currently.  The patient should have a duplex ultrasound of the dialysis access in 6 months. The patient will follow-up with me in the office after each ultrasound    2. Diabetes mellitus without complication (HCC) Continue hypoglycemic medications as already ordered, these medications have been reviewed and there are no changes at this time.  Hgb A1C to be monitored as already arranged by primary service  3. Primary hypertension Continue antihypertensive medications as already ordered, these medications have been reviewed and there are no changes at this time.   Current Outpatient Medications on File Prior to Visit  Medication Sig Dispense Refill   albuterol (VENTOLIN HFA) 108 (90 Base) MCG/ACT inhaler Inhale 2 puffs into the lungs every 6 (six) hours as needed for wheezing or shortness of breath.     allopurinol (ZYLOPRIM) 300 MG tablet Take 300 mg by mouth daily.      amLODipine (NORVASC) 5 MG tablet Take 5 mg by mouth daily.  aspirin EC 81 MG tablet Take 81 mg by mouth daily.     atorvastatin (LIPITOR) 20 MG tablet Take 20 mg by mouth daily.     calcitRIOL (ROCALTROL) 0.25 MCG capsule Take 0.25 mcg by mouth daily.     furosemide (LASIX) 40 MG tablet Take 40 mg by mouth daily as needed (for fluid retention (swollen ankles)---typically no more than twice a week). Takes on T, Th, Sat, Sun only if Diastolic is 60 or above     gabapentin (NEURONTIN) 100 MG  capsule Take 100 mg by mouth 3 (three) times daily.     metoprolol tartrate (LOPRESSOR) 50 MG tablet Take 50 mg by mouth 2 (two) times daily.     pantoprazole (PROTONIX) 40 MG tablet Take 40 mg by mouth 2 (two) times daily.     SYMBICORT 160-4.5 MCG/ACT inhaler Inhale 2 puffs into the lungs.     triamcinolone cream (KENALOG) 0.1 % Apply 1 Application topically 2 (two) times daily.     colchicine 0.6 MG tablet Take 0.6 mg by mouth daily. (Patient not taking: Reported on 01/13/2023)     losartan (COZAAR) 100 MG tablet Take 100 mg by mouth daily. (Patient not taking: Reported on 01/13/2023)     traMADol (ULTRAM) 50 MG tablet Take 1 tablet (50 mg total) by mouth every 6 (six) hours as needed. (Patient not taking: Reported on 01/13/2023) 20 tablet 0   No current facility-administered medications on file prior to visit.    There are no Patient Instructions on file for this visit. No follow-ups on file.   Georgiana Spinner, NP

## 2023-03-06 ENCOUNTER — Other Ambulatory Visit: Payer: Self-pay | Admitting: Family Medicine

## 2023-03-06 DIAGNOSIS — M810 Age-related osteoporosis without current pathological fracture: Secondary | ICD-10-CM

## 2023-04-10 ENCOUNTER — Other Ambulatory Visit: Payer: Medicare HMO

## 2023-04-11 ENCOUNTER — Other Ambulatory Visit: Payer: Medicare HMO

## 2023-06-26 ENCOUNTER — Telehealth (INDEPENDENT_AMBULATORY_CARE_PROVIDER_SITE_OTHER): Payer: Self-pay

## 2023-06-26 NOTE — Telephone Encounter (Signed)
Spoke with the patient and she is scheduled with Dr. Wyn Quaker on 07/07/23 with a 2:00 pm arrival time to the [facility] for a permcath removal. Pre-procedure instructions were discussed and will be sent to Mychart and mailed.

## 2023-07-01 ENCOUNTER — Ambulatory Visit
Admission: RE | Admit: 2023-07-01 | Discharge: 2023-07-01 | Disposition: A | Discharge status: Home / Self Care | Payer: Medicare HMO | Source: Ambulatory Visit | Attending: Family Medicine | Admitting: Family Medicine

## 2023-07-01 DIAGNOSIS — M810 Age-related osteoporosis without current pathological fracture: Secondary | ICD-10-CM | POA: Diagnosis present

## 2023-07-04 ENCOUNTER — Telehealth (INDEPENDENT_AMBULATORY_CARE_PROVIDER_SITE_OTHER): Payer: Self-pay

## 2023-07-04 NOTE — Telephone Encounter (Signed)
 Michelle Murillo from the patient's dialysis center called to cancel the patient's permcath removal for 07/07/23. This has been canceled.

## 2023-07-07 ENCOUNTER — Ambulatory Visit: Admission: RE | Admit: 2023-07-07 | Payer: Medicare HMO | Source: Home / Self Care | Admitting: Vascular Surgery

## 2023-07-07 ENCOUNTER — Encounter: Admission: RE | Payer: Self-pay | Source: Home / Self Care

## 2023-07-07 DIAGNOSIS — N186 End stage renal disease: Secondary | ICD-10-CM

## 2023-07-07 SURGERY — DIALYSIS/PERMA CATHETER REMOVAL
Anesthesia: LOCAL

## 2023-07-07 NOTE — Telephone Encounter (Signed)
 Spoke with the patient and she wanted to know when she would be rescheduled for her permcath removal. I explained that once the dialysis center felt she was ready to be rescheduled they would send an order to do so.

## 2023-07-23 ENCOUNTER — Emergency Department: Payer: Medicare HMO

## 2023-07-23 ENCOUNTER — Encounter
Admission: EM | Disposition: A | Discharge status: Home Health | Payer: Self-pay | Source: Home / Self Care | Attending: Internal Medicine

## 2023-07-23 ENCOUNTER — Inpatient Hospital Stay
Admission: EM | Admit: 2023-07-23 | Discharge: 2023-07-25 | DRG: 907 | Disposition: A | Discharge status: Home Health | Payer: Medicare HMO | Attending: Internal Medicine | Admitting: Internal Medicine

## 2023-07-23 ENCOUNTER — Other Ambulatory Visit: Payer: Self-pay

## 2023-07-23 DIAGNOSIS — E875 Hyperkalemia: Secondary | ICD-10-CM | POA: Diagnosis present

## 2023-07-23 DIAGNOSIS — W19XXXA Unspecified fall, initial encounter: Principal | ICD-10-CM

## 2023-07-23 DIAGNOSIS — Z888 Allergy status to other drugs, medicaments and biological substances status: Secondary | ICD-10-CM | POA: Diagnosis not present

## 2023-07-23 DIAGNOSIS — Y92013 Bedroom of single-family (private) house as the place of occurrence of the external cause: Secondary | ICD-10-CM

## 2023-07-23 DIAGNOSIS — D631 Anemia in chronic kidney disease: Secondary | ICD-10-CM | POA: Diagnosis present

## 2023-07-23 DIAGNOSIS — Z7951 Long term (current) use of inhaled steroids: Secondary | ICD-10-CM | POA: Diagnosis not present

## 2023-07-23 DIAGNOSIS — Z8249 Family history of ischemic heart disease and other diseases of the circulatory system: Secondary | ICD-10-CM | POA: Diagnosis not present

## 2023-07-23 DIAGNOSIS — S2232XA Fracture of one rib, left side, initial encounter for closed fracture: Secondary | ICD-10-CM | POA: Diagnosis present

## 2023-07-23 DIAGNOSIS — Z8679 Personal history of other diseases of the circulatory system: Secondary | ICD-10-CM

## 2023-07-23 DIAGNOSIS — Y92009 Unspecified place in unspecified non-institutional (private) residence as the place of occurrence of the external cause: Secondary | ICD-10-CM

## 2023-07-23 DIAGNOSIS — E1169 Type 2 diabetes mellitus with other specified complication: Secondary | ICD-10-CM | POA: Diagnosis not present

## 2023-07-23 DIAGNOSIS — E1122 Type 2 diabetes mellitus with diabetic chronic kidney disease: Secondary | ICD-10-CM | POA: Diagnosis present

## 2023-07-23 DIAGNOSIS — Z95828 Presence of other vascular implants and grafts: Secondary | ICD-10-CM | POA: Diagnosis not present

## 2023-07-23 DIAGNOSIS — I2510 Atherosclerotic heart disease of native coronary artery without angina pectoris: Secondary | ICD-10-CM | POA: Insufficient documentation

## 2023-07-23 DIAGNOSIS — I132 Hypertensive heart and chronic kidney disease with heart failure and with stage 5 chronic kidney disease, or end stage renal disease: Secondary | ICD-10-CM | POA: Diagnosis present

## 2023-07-23 DIAGNOSIS — Z9889 Other specified postprocedural states: Secondary | ICD-10-CM | POA: Diagnosis not present

## 2023-07-23 DIAGNOSIS — N186 End stage renal disease: Secondary | ICD-10-CM

## 2023-07-23 DIAGNOSIS — J449 Chronic obstructive pulmonary disease, unspecified: Secondary | ICD-10-CM | POA: Diagnosis present

## 2023-07-23 DIAGNOSIS — Z7982 Long term (current) use of aspirin: Secondary | ICD-10-CM

## 2023-07-23 DIAGNOSIS — Z885 Allergy status to narcotic agent status: Secondary | ICD-10-CM

## 2023-07-23 DIAGNOSIS — Z992 Dependence on renal dialysis: Secondary | ICD-10-CM | POA: Diagnosis not present

## 2023-07-23 DIAGNOSIS — Z6833 Body mass index (BMI) 33.0-33.9, adult: Secondary | ICD-10-CM

## 2023-07-23 DIAGNOSIS — S36899A Unspecified injury of other intra-abdominal organs, initial encounter: Secondary | ICD-10-CM

## 2023-07-23 DIAGNOSIS — Z951 Presence of aortocoronary bypass graft: Secondary | ICD-10-CM | POA: Diagnosis not present

## 2023-07-23 DIAGNOSIS — I701 Atherosclerosis of renal artery: Secondary | ICD-10-CM | POA: Diagnosis not present

## 2023-07-23 DIAGNOSIS — I10 Essential (primary) hypertension: Secondary | ICD-10-CM | POA: Diagnosis present

## 2023-07-23 DIAGNOSIS — S36892A Contusion of other intra-abdominal organs, initial encounter: Principal | ICD-10-CM | POA: Diagnosis present

## 2023-07-23 DIAGNOSIS — N281 Cyst of kidney, acquired: Secondary | ICD-10-CM | POA: Diagnosis not present

## 2023-07-23 DIAGNOSIS — Z800 Family history of malignant neoplasm of digestive organs: Secondary | ICD-10-CM

## 2023-07-23 DIAGNOSIS — I25810 Atherosclerosis of coronary artery bypass graft(s) without angina pectoris: Secondary | ICD-10-CM | POA: Diagnosis present

## 2023-07-23 DIAGNOSIS — S37092A Other injury of left kidney, initial encounter: Secondary | ICD-10-CM | POA: Diagnosis not present

## 2023-07-23 DIAGNOSIS — E669 Obesity, unspecified: Secondary | ICD-10-CM | POA: Diagnosis present

## 2023-07-23 DIAGNOSIS — M109 Gout, unspecified: Secondary | ICD-10-CM | POA: Diagnosis present

## 2023-07-23 DIAGNOSIS — Z79899 Other long term (current) drug therapy: Secondary | ICD-10-CM

## 2023-07-23 DIAGNOSIS — K683 Retroperitoneal hematoma: Secondary | ICD-10-CM

## 2023-07-23 DIAGNOSIS — H548 Legal blindness, as defined in USA: Secondary | ICD-10-CM | POA: Diagnosis present

## 2023-07-23 DIAGNOSIS — I723 Aneurysm of iliac artery: Secondary | ICD-10-CM | POA: Diagnosis present

## 2023-07-23 DIAGNOSIS — F17210 Nicotine dependence, cigarettes, uncomplicated: Secondary | ICD-10-CM | POA: Diagnosis present

## 2023-07-23 DIAGNOSIS — N2581 Secondary hyperparathyroidism of renal origin: Secondary | ICD-10-CM | POA: Diagnosis present

## 2023-07-23 DIAGNOSIS — G40909 Epilepsy, unspecified, not intractable, without status epilepticus: Secondary | ICD-10-CM | POA: Diagnosis present

## 2023-07-23 DIAGNOSIS — Z8674 Personal history of sudden cardiac arrest: Secondary | ICD-10-CM

## 2023-07-23 DIAGNOSIS — I509 Heart failure, unspecified: Secondary | ICD-10-CM | POA: Diagnosis present

## 2023-07-23 DIAGNOSIS — K219 Gastro-esophageal reflux disease without esophagitis: Secondary | ICD-10-CM | POA: Diagnosis present

## 2023-07-23 DIAGNOSIS — R079 Chest pain, unspecified: Secondary | ICD-10-CM | POA: Diagnosis present

## 2023-07-23 DIAGNOSIS — W06XXXA Fall from bed, initial encounter: Secondary | ICD-10-CM | POA: Diagnosis present

## 2023-07-23 HISTORY — PX: EMBOLIZATION (CATH LAB): CATH118239

## 2023-07-23 LAB — HEMOGLOBIN AND HEMATOCRIT, BLOOD
HCT: 34.6 % — ABNORMAL LOW (ref 36.0–46.0)
HCT: 36.1 % (ref 36.0–46.0)
Hemoglobin: 10.7 g/dL — ABNORMAL LOW (ref 12.0–15.0)
Hemoglobin: 11 g/dL — ABNORMAL LOW (ref 12.0–15.0)

## 2023-07-23 LAB — CBC
HCT: 35.7 % — ABNORMAL LOW (ref 36.0–46.0)
Hemoglobin: 11.1 g/dL — ABNORMAL LOW (ref 12.0–15.0)
MCH: 31.2 pg (ref 26.0–34.0)
MCHC: 31.1 g/dL (ref 30.0–36.0)
MCV: 100.3 fL — ABNORMAL HIGH (ref 80.0–100.0)
Platelets: 164 10*3/uL (ref 150–400)
RBC: 3.56 MIL/uL — ABNORMAL LOW (ref 3.87–5.11)
RDW: 14.5 % (ref 11.5–15.5)
WBC: 5 10*3/uL (ref 4.0–10.5)
nRBC: 0 % (ref 0.0–0.2)

## 2023-07-23 LAB — BASIC METABOLIC PANEL
Anion gap: 10 (ref 5–15)
BUN: 52 mg/dL — ABNORMAL HIGH (ref 8–23)
CO2: 25 mmol/L (ref 22–32)
Calcium: 8.7 mg/dL — ABNORMAL LOW (ref 8.9–10.3)
Chloride: 106 mmol/L (ref 98–111)
Creatinine, Ser: 4.66 mg/dL — ABNORMAL HIGH (ref 0.44–1.00)
GFR, Estimated: 9 mL/min — ABNORMAL LOW (ref >60)
Glucose, Bld: 104 mg/dL — ABNORMAL HIGH (ref 70–99)
Potassium: 4.7 mmol/L (ref 3.5–5.1)
Sodium: 141 mmol/L (ref 135–145)

## 2023-07-23 LAB — HEMOGLOBIN A1C
Hgb A1c MFr Bld: 5.9 % — ABNORMAL HIGH (ref 4.8–5.6)
Mean Plasma Glucose: 122.63 mg/dL

## 2023-07-23 LAB — GLUCOSE, CAPILLARY
Glucose-Capillary: 121 mg/dL — ABNORMAL HIGH (ref 70–99)
Glucose-Capillary: 128 mg/dL — ABNORMAL HIGH (ref 70–99)

## 2023-07-23 SURGERY — EMBOLIZATION
Anesthesia: Moderate Sedation | Laterality: Left

## 2023-07-23 MED ORDER — ONDANSETRON HCL 4 MG/2ML IJ SOLN
4.0000 mg | Freq: Four times a day (QID) | INTRAMUSCULAR | Status: DC | PRN
Start: 2023-07-23 — End: 2023-07-23

## 2023-07-23 MED ORDER — MIDAZOLAM HCL 2 MG/2ML IJ SOLN
INTRAMUSCULAR | Status: DC | PRN
  Administered 2023-07-23 (×3): .5 mg via INTRAVENOUS
  Administered 2023-07-23: 2 mg via INTRAVENOUS

## 2023-07-23 MED ORDER — DIPHENHYDRAMINE HCL 50 MG/ML IJ SOLN: 50.0000 mg | Freq: Once | INTRAMUSCULAR | Status: DC | PRN

## 2023-07-23 MED ORDER — CHLORHEXIDINE GLUCONATE CLOTH 2 % EX PADS
6.0000 | MEDICATED_PAD | Freq: Every day | CUTANEOUS | Status: DC
  Administered 2023-07-23 – 2023-07-25 (×3): 6 via TOPICAL

## 2023-07-23 MED ORDER — FENTANYL CITRATE PF 50 MCG/ML IJ SOSY: 12.5000 ug | PREFILLED_SYRINGE | Freq: Once | INTRAMUSCULAR | Status: DC | PRN

## 2023-07-23 MED ORDER — SODIUM CHLORIDE 0.9 % IV SOLN: INTRAVENOUS | Status: DC

## 2023-07-23 MED ORDER — FENTANYL CITRATE PF 50 MCG/ML IJ SOSY
50.0000 ug | PREFILLED_SYRINGE | Freq: Once | INTRAMUSCULAR | Status: AC
  Administered 2023-07-23: 50 ug via INTRAVENOUS
  Filled 2023-07-23: qty 1

## 2023-07-23 MED ORDER — ONDANSETRON HCL 4 MG/2ML IJ SOLN
4.0000 mg | Freq: Four times a day (QID) | INTRAMUSCULAR | Status: DC | PRN
  Administered 2023-07-25: 4 mg via INTRAVENOUS
  Filled 2023-07-23: qty 2

## 2023-07-23 MED ORDER — ORAL CARE MOUTH RINSE: 15.0000 mL | OROMUCOSAL | Status: DC | PRN

## 2023-07-23 MED ORDER — FENTANYL CITRATE (PF) 100 MCG/2ML IJ SOLN
INTRAMUSCULAR | Status: AC
  Filled 2023-07-23: qty 2

## 2023-07-23 MED ORDER — SODIUM CHLORIDE 0.9 % IV SOLN: 250.0000 mL | INTRAVENOUS | Status: AC | PRN

## 2023-07-23 MED ORDER — ONDANSETRON HCL 4 MG PO TABS: 4.0000 mg | ORAL_TABLET | Freq: Four times a day (QID) | ORAL | Status: DC | PRN

## 2023-07-23 MED ORDER — SODIUM CHLORIDE 0.9% FLUSH: 3.0000 mL | INTRAVENOUS | Status: DC | PRN

## 2023-07-23 MED ORDER — MIDAZOLAM HCL 2 MG/ML PO SYRP
8.0000 mg | ORAL_SOLUTION | Freq: Once | ORAL | Status: DC | PRN
Start: 2023-07-23 — End: 2023-07-23
  Filled 2023-07-23: qty 5

## 2023-07-23 MED ORDER — CEFAZOLIN SODIUM-DEXTROSE 1-4 GM/50ML-% IV SOLN
1.0000 g | INTRAVENOUS | Status: DC
  Filled 2023-07-23: qty 50

## 2023-07-23 MED ORDER — FAMOTIDINE 20 MG PO TABS: 40.0000 mg | ORAL_TABLET | Freq: Once | ORAL | Status: DC | PRN

## 2023-07-23 MED ORDER — IODIXANOL 320 MG/ML IV SOLN
INTRAVENOUS | Status: DC | PRN
  Administered 2023-07-23: 55 mL via INTRA_ARTERIAL

## 2023-07-23 MED ORDER — SODIUM CHLORIDE 0.9% FLUSH
3.0000 mL | Freq: Two times a day (BID) | INTRAVENOUS | Status: DC
  Administered 2023-07-23 – 2023-07-25 (×5): 3 mL via INTRAVENOUS

## 2023-07-23 MED ORDER — LIDOCAINE-EPINEPHRINE (PF) 1 %-1:200000 IJ SOLN
INTRAMUSCULAR | Status: DC | PRN
  Administered 2023-07-23: 10 mL via INTRADERMAL

## 2023-07-23 MED ORDER — OXYCODONE HCL 5 MG PO TABS
5.0000 mg | ORAL_TABLET | ORAL | Status: DC | PRN
  Administered 2023-07-23 – 2023-07-25 (×4): 5 mg via ORAL
  Filled 2023-07-23 (×4): qty 1

## 2023-07-23 MED ORDER — HYDROMORPHONE HCL 1 MG/ML IJ SOLN: 0.5000 mg | INTRAMUSCULAR | Status: DC | PRN

## 2023-07-23 MED ORDER — ONDANSETRON HCL 4 MG/2ML IJ SOLN
4.0000 mg | Freq: Four times a day (QID) | INTRAMUSCULAR | Status: DC | PRN
  Administered 2023-07-23: 4 mg via INTRAVENOUS

## 2023-07-23 MED ORDER — MIDAZOLAM HCL 5 MG/5ML IJ SOLN
INTRAMUSCULAR | Status: AC
  Filled 2023-07-23: qty 5

## 2023-07-23 MED ORDER — ONDANSETRON HCL 4 MG/2ML IJ SOLN
INTRAMUSCULAR | Status: AC
  Filled 2023-07-23: qty 2

## 2023-07-23 MED ORDER — INSULIN ASPART 100 UNIT/ML IJ SOLN
0.0000 [IU] | Freq: Three times a day (TID) | INTRAMUSCULAR | Status: DC
  Administered 2023-07-25 (×2): 1 [IU] via SUBCUTANEOUS
  Filled 2023-07-23 (×2): qty 1

## 2023-07-23 MED ORDER — HYDROMORPHONE HCL 1 MG/ML IJ SOLN
1.0000 mg | INTRAMUSCULAR | Status: DC | PRN
  Administered 2023-07-23 – 2023-07-24 (×3): 1 mg via INTRAVENOUS
  Filled 2023-07-23 (×3): qty 1

## 2023-07-23 MED ORDER — HEPARIN (PORCINE) IN NACL 1000-0.9 UT/500ML-% IV SOLN
INTRAVENOUS | Status: DC | PRN
  Administered 2023-07-23 (×2): 1000 mL

## 2023-07-23 MED ORDER — CEFAZOLIN SODIUM-DEXTROSE 1-4 GM/50ML-% IV SOLN
INTRAVENOUS | Status: AC
  Filled 2023-07-23: qty 50

## 2023-07-23 MED ORDER — METHYLPREDNISOLONE SODIUM SUCC 125 MG IJ SOLR: 125.0000 mg | Freq: Once | INTRAMUSCULAR | Status: DC | PRN

## 2023-07-23 MED ORDER — FENTANYL CITRATE (PF) 100 MCG/2ML IJ SOLN
INTRAMUSCULAR | Status: DC | PRN
  Administered 2023-07-23: 50 ug via INTRAVENOUS
  Administered 2023-07-23 (×3): 25 ug via INTRAVENOUS

## 2023-07-23 SURGICAL SUPPLY — 20 items
CATH ANGIO 5F PIGTAIL 65CM (CATHETERS) IMPLANT
CATH BEACON 5 .035 65 C2 TIP (CATHETERS) IMPLANT
CATH MICROCATH PRGRT 2.8F 110 (CATHETERS) IMPLANT
COVER PROBE ULTRASOUND 5X96 (MISCELLANEOUS) IMPLANT
DEVICE OCCLUSION POD5 (Vascular Products) IMPLANT
DEVICE OCCLUSION POD6 (Vascular Products) IMPLANT
DEVICE OCCLUSION PODJ60 (Vascular Products) IMPLANT
DEVICE STARCLOSE SE CLOSURE (Vascular Products) IMPLANT
GLIDEWIRE STIFF .35X180X3 HYDR (WIRE) IMPLANT
HANDLE DETACHMENT COIL (MISCELLANEOUS) IMPLANT
MICROCATH PROGREAT 2.8F 110 CM (CATHETERS) ×1 IMPLANT
OCCLUSION DEVICE POD5 (Vascular Products) ×2 IMPLANT
OCCLUSION DEVICE POD6 (Vascular Products) ×1 IMPLANT
OCCLUSION DEVICE PODJ60 (Vascular Products) ×1 IMPLANT
PACK ANGIOGRAPHY (CUSTOM PROCEDURE TRAY) ×1 IMPLANT
PANNUS RETENTION SYSTEM 2 PAD (MISCELLANEOUS) IMPLANT
SHEATH BRITE TIP 5FRX11 (SHEATH) IMPLANT
SYR MEDRAD MARK 7 150ML (SYRINGE) IMPLANT
TUBING CONTRAST HIGH PRESS 72 (TUBING) IMPLANT
WIRE J 3MM .035X145CM (WIRE) IMPLANT

## 2023-07-23 NOTE — Assessment & Plan Note (Signed)
+   traumatic fall at home w/ L sided rib pain and noted traumatic L RP hematoma w/ assd L renal cyst.  Pain control  IS  Fall precautions  Monitor

## 2023-07-23 NOTE — Assessment & Plan Note (Signed)
 S/p CABG  No active CP  Monitor  Hold ASA in setting of renal cyst w/ secondary RP hematoma

## 2023-07-23 NOTE — Assessment & Plan Note (Signed)
 Baseline ESRD on HD M,F  Cont nephrology prn

## 2023-07-23 NOTE — ED Notes (Signed)
 Pt requesting a purewick be placed and is asking for additional pain medication, pt's perineal area cleaned and purewick applied without difficulty, pt tol well

## 2023-07-23 NOTE — ED Notes (Signed)
 This RN to bedside to check in on pt, pt resting quietly with her eyes closed, O2 sat reading of 86 on room air, pt denies wearing O2 at home, pt up to 89% with talking, pt placed on 2L of O2, pt asking for her sister, this RN out to the lobby and no family present at this time, pt reassured that when family arrives they will be allowed to visit, no complaints verbalized at this time

## 2023-07-23 NOTE — Assessment & Plan Note (Signed)
Cont allopurinol

## 2023-07-23 NOTE — ED Provider Notes (Signed)
 [facility] Provider Note    Event Date/Time   First MD Initiated Contact with Patient 07/23/23 [phone]     (approximate)  History   Chief Complaint: Fall  HPI  Michelle Murillo is a 78 y.o. female with a past medical history of end-stage renal disease on hemodialysis Monday and Friday, COPD, diabetes, hypertension, presents to the emergency department for a fall.  According to the patient she was in bed this morning when she accidentally rolled out of bed falling onto her left side.  EMS states approximately 2 feet off the ground thinly carpeted floor.  Patient denies hitting her head.  No LOC.  No anticoagulation.  Patient is complaining of pain in her left lateral chest extending into the left flank.  Physical Exam   Triage Vital Signs: ED Triage Vitals [07/23/23 0739]  Encounter Vitals Group     BP      Systolic BP Percentile      Diastolic BP Percentile      Pulse      Resp      Temp      Temp src      SpO2      Weight 198 lb (89.8 kg)     Height 5\' 4"  (1.626 m)     Head Circumference      Peak Flow      Pain Score      Pain Loc      Pain Education      Exclude from Growth Chart     Most recent vital signs: There were no vitals filed for this visit.  General: Awake, no distress.  CV:  Good peripheral perfusion.  Regular rate and rhythm  Resp:  Normal effort.  Equal breath sounds bilaterally.  Moderate tenderness to palpation of the left lateral chest wall from the mid chest inferiorly to the lateral flank Abd:  No distention.  Soft, moderate tenderness to palpation just below the left sided ribs.  No bruising/ecchymosis noted.  ED Results / Procedures / Treatments   RADIOLOGY  I have reviewed and interpreted the CT findings.  Patient appears to have significant cysts in bilateral kidneys I do not appreciate any obvious rib fracture.  Radiology is read the CT scan as an acute ninth rib fracture on the left side.  There is a possible  retroperitoneal hematoma although limited evaluation as this is a noncontrasted study.  Also there is an increase from a right common iliac artery aneurysm from 6 cm in 2019 to 10 cm currently.   MEDICATIONS ORDERED IN ED: Medications - No data to display   IMPRESSION / MDM / ASSESSMENT AND PLAN / ED COURSE  I reviewed the triage vital signs and the nursing notes.  Patient's presentation is most consistent with acute presentation with potential threat to life or bodily function.  Patient presents to the emergency department after rolling out of bed falling onto her left side.  Patient states moderate pain to her left chest extending down into the left abdomen.  Did not hit her head.  No LOC.  No anticoagulation.  Good range of motion in all her extremities upper and lower including bilateral hips.  Patient is end-stage renal on hemodialysis Monday and Friday only creates urine each day, we will obtain noncontrasted CT scans of the chest abdomen and pelvis to further evaluate.  Patient agreeable to plan of care.  Will dose fentanyl for pain control and check basic labs given her  end-stage renal status.  CT scan shows possible retroperitoneal hematoma possible aneurysm expansion of the right common iliac artery from 6 cm to 10 cm also an acute left-sided rib fracture.  Patient states her pain is improving following fentanyl but is starting to come back.  Patient did briefly desat down to 88% after receiving pain medication.  I spoke to Dr. Gilda Crease of vascular surgery who will speak to his partner Dr. Wyn Quaker to discuss if any further intervention is needed from their standpoint  Vascular surgery will be taken to the OR.  I also discussed with the hospitalist for admission to the medical service.  FINAL CLINICAL IMPRESSION(S) / ED DIAGNOSES   Fall Musculoskeletal pain Retroperitoneal hematoma/hemorrhage Iliac aneurysm  Note:  This document was prepared using Dragon voice recognition software and  may include unintentional dictation errors.   Minna Antis, MD 07/23/23 [phone]

## 2023-07-23 NOTE — Assessment & Plan Note (Addendum)
 Noted L renal cyst with secondary retroperitoneal hemorrhage status post fall Formally evaluated by vascular surgery. Status post Coil embolization of the left renal artery with a total of 4 Ruby coils  Hemoglobin 11.1 on presentation Trend overnight Transfuse for hemoglobin less than 7 Hold offending agents  Monitor

## 2023-07-23 NOTE — ED Notes (Signed)
 Allyson rn at bedside to transport pt to the vascular lab

## 2023-07-23 NOTE — Assessment & Plan Note (Signed)
 BP stable Titrate home regimen

## 2023-07-23 NOTE — Plan of Care (Signed)

## 2023-07-23 NOTE — Assessment & Plan Note (Signed)
Stable from a resp standpoint  Cont home inhaler

## 2023-07-23 NOTE — Consult Note (Signed)
 Hospital Consult    Reason for Consult:  Retroperitoneal Hematoma.  Requesting Physician:  Dr Minna Antis MD  MRN #:  [id number]  History of Present Illness: This is a 78 y.o. female with a past medical history of end-stage renal disease on hemodialysis Monday and Friday, COPD, diabetes, hypertension, presents to the emergency department for a fall. According to the patient she was in bed this morning when she accidentally rolled out of bed falling onto her left side. EMS states approximately 2 feet off the ground thinly carpeted floor.. No anticoagulation. Patient is complaining of pain in her left lateral chest extending into the left flank.  Upon workup patient underwent a CT scan without contrast to her chest, abdomen and pelvis which shows a possible retroperitoneal hematoma from a ruptured left renal cyst and an expansion of the right common iliac artery aneurysm from 6 cm to 10 cm that does not appear to be bleeding.  She is also noted to have a left-sided rib fracture.  Patient does endorse pain to her left side and left rib cage.  She is requiring pain medicine in the emergency room.  No other complaints at this time and vitals all remained stable.  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  pt was in a coma for 18 days after this.(during CABG)   COPD (chronic obstructive pulmonary disease) (HCC)    Coronary artery disease    a.) s/p 4v CABG 06/12/2011; b.) LHC 03/07/2020: 10% dRCA, 75% p-mRCA, 75% pLCx, 50% p-mLAD, 45% mLAD; SVG-RCA and SVG-OM occluded   Diabetes mellitus without complication (HCC)    borderline, does not take any medication   Dyspnea    ESRD on hemodialysis (HCC) 2019   a.) M-W-F   GERD (gastroesophageal  reflux disease)    Gout    Hypertension    Legally blind    unable to see any more than shapes   Right ovarian cyst    S/P CABG x 4 06/12/2011   a.) LIMA-LAD, SVG-OM1, SVG-LCx, SVG-RCA --> procedure complicated by bradycardic arrest and respiratory failure (pulmonary edema) resulting in prolonged ICU admission. Developed rapid A.fib and required DCCV x 3 durrsing ICU admission.   Seizures (HCC) 2013   only happened during cabg procedure while on the table   Sleep apnea    a.) does not use noctural PAP therapy    Past Surgical History:  Procedure Laterality Date   A/V FISTULAGRAM Left 01/13/2023   Procedure: A/V Fistulagram;  Surgeon: Annice Needy, MD;  Location: [facility] INVASIVE CV LAB;  Service: Cardiovascular;  Laterality: Left;   ABDOMINAL AORTOGRAM N/A 06/23/2017   Procedure: ABDOMINAL AORTOGRAM;  Surgeon: Annice Needy, MD;  Location: [facility] INVASIVE CV LAB;  Service: Cardiovascular;  Laterality: N/A;   ABDOMINAL SURGERY  2017   aneurysm repair with dr. dew   AV FISTULA PLACEMENT Left 11/07/2022   Procedure: ARTERIOVENOUS (AV) FISTULA CREATION (BRACHIALCEPHALIC);  Surgeon: Annice Needy, MD;  Location: [facility] ORS;  Service: Vascular;  Laterality: Left;   BRONCHOSCOPY  2015   done at [facility]. carcinoid of airways   CHOLECYSTECTOMY     age 49   CORONARY ARTERY BYPASS GRAFT  06/12/2011   DIALYSIS/PERMA  CATHETER INSERTION N/A 06/24/2022   Procedure: DIALYSIS/PERMA CATHETER INSERTION;  Surgeon: Annice Needy, MD;  Location: [facility] INVASIVE CV LAB;  Service: Cardiovascular;  Laterality: N/A;   DIALYSIS/PERMA CATHETER INSERTION N/A 09/03/2022   Procedure: DIALYSIS/PERMA CATHETER INSERTION;  Surgeon: Renford Dills, MD;  Location: [facility] INVASIVE CV LAB;  Service: Cardiovascular;  Laterality: N/A;   DIALYSIS/PERMA CATHETER INSERTION N/A 10/16/2022   Procedure: DIALYSIS/PERMA CATHETER INSERTION;  Surgeon: Renford Dills, MD;  Location: [facility] INVASIVE CV LAB;  Service: Cardiovascular;  Laterality:  N/A;   DIALYSIS/PERMA CATHETER INSERTION N/A 10/03/2022   Procedure: DIALYSIS/PERMA CATHETER INSERTION;  Surgeon: Annice Needy, MD;  Location: [facility] INVAS[facility] CV [facility];  Service: Cardiovascular;  Laterality: N/A;   ENDOVASCULAR REPAIR/STENT GRAFT N/A 07/06/2018   Procedure: ENDOVASCULAR REPAIR/STENT GRAFT;  Surgeon: Annice Needy, MD;  Location: [facility] INVAS[facility] CV [facility];  Service: Cardiovascular;  Laterality: N/A;   laryngeal nodule surgery     LEFT HEART CATH AND CORS/GRAFTS ANGIOGRAPHY Left 03/07/2020   Procedure: LEFT HEART CATH AND CORS/GRAFTS ANGIOGRAPHY;  Surgeon: Lamar Blinks, MD;  Location: [facility] INVASIVE CV LAB;  Service: Cardiovascular;  Laterality: Left;   OOPHORECTOMY Right     Allergies  Allergen Reactions   Isosorbide Nitrate Nausea And Vomiting   Bupropion Nausea Only    Other reaction(s): Other (See Comments) "I didn't do well with it.".hallucinates   Morphine And Codeine Itching    Per pt morphine only    Prior to Admission medications   Medication Sig Start Date End Date Taking? Authorizing Provider  albuterol (VENTOLIN HFA) 108 (90 Base) MCG/ACT inhaler Inhale 2 puffs into the lungs every 6 (six) hours as needed for wheezing or shortness of breath.    [provider]  allopurinol (ZYLOPRIM) 300 MG tablet Take 300 mg by mouth daily.  03/14/17   [provider]  amLODipine (NORVASC) 5 MG tablet Take 5 mg by mouth daily. 08/08/21   [provider]  aspirin EC 81 MG tablet Take 81 mg by mouth daily.    [provider]  atorvastatin (LIPITOR) 20 MG tablet Take 20 mg by mouth daily. 08/16/21   [provider]  calcitRIOL (ROCALTROL) 0.25 MCG capsule Take 0.25 mcg by mouth daily.    [provider]  colchicine 0.6 MG tablet Take 0.6 mg by mouth daily. Patient not taking: Reported on 01/13/2023 08/06/22   [provider]  furosemide (LASIX) 40 MG tablet Take 40 mg by mouth daily as needed (for fluid retention (swollen  ankles)---typically no more than twice a week). Takes on T, Th, Sat, Sun only if Diastolic is 60 or above    [provider]  gabapentin (NEURONTIN) 100 MG capsule Take 100 mg by mouth 3 (three) times daily.    [provider]  losartan (COZAAR) 100 MG tablet Take 100 mg by mouth daily. Patient not taking: Reported on 01/13/2023    [provider]  metoprolol tartrate (LOPRESSOR) 50 MG tablet Take 50 mg by mouth 2 (two) times daily. 09/12/21   [provider]  pantoprazole (PROTONIX) 40 MG tablet Take 40 mg by mouth 2 (two) times daily.    [provider]  SYMBICORT 160-4.5 MCG/ACT inhaler Inhale 2 puffs into the lungs. 11/21/22   [provider]  traMADol (ULTRAM) 50 MG tablet Take 1 tablet (50 mg total) by mouth every 6 (six) hours as needed. Patient not taking: Reported on 01/13/2023 11/07/22 11/07/23  Annice Needy, MD  triamcinolone cream (KENALOG)  0.1 % Apply 1 Application topically 2 (two) times daily. 12/17/22   [provider]    Social History   Socioeconomic History   Marital status: Widowed    Spouse name: Not on file   Number of children: 2   Years of education: Not on file   Highest education level: Not on file  Occupational History   Not on file  Tobacco Use   Smoking status: Every Day    Current packs/day: 0.50    Types: Cigarettes   Smokeless tobacco: Never   Tobacco comments:    6-7 cigs per day  Vaping Use   Vaping status: Never Used  Substance and Sexual Activity   Alcohol use: No   Drug use: No   Sexual activity: Not on file  Other Topics Concern   Not on file  Social History Narrative   2 sons: 29 & 41 y.o. : lives by herself    Social Drivers of Corporate investment banker Strain: Not on file  Food Insecurity: Not on file  Transportation Needs: Not on file  Physical Activity: Not on file  Stress: Not on file  Social Connections: Not on file  Intimate Partner Violence: Not on file      Family History  Problem Relation Age of Onset   Congestive Heart Failure Mother    Heart attack Mother    Cancer Father    Liver cancer Father    Cancer Paternal Grandfather     ROS: Otherwise negative unless mentioned in HPI  Physical Examination  Vitals:   07/23/23 0744 07/23/23 0748  BP:  (!) 133/45  Pulse: 70   Resp: 15   Temp: 98.6 F (37 C)   SpO2: 97%    Body mass index is 33.99 kg/m.  General:  WDWN in NAD Gait: Not observed HENT: WNL, normocephalic Pulmonary: normal non-labored breathing, without Rales, rhonchi,  wheezing Cardiac: regular, without  Murmurs, rubs or gallops; without carotid bruits Abdomen: Positive bowel sounds throughout, soft, NT/ND, no masses Skin: without rashes Vascular Exam/Pulses: Palpable pulses throughout. All extremities are warm to touch.  Extremities: without ischemic changes, without Gangrene , without cellulitis; without open wounds;  Musculoskeletal: no muscle wasting or atrophy  Neurologic: A&O X 3;  No focal weakness or paresthesias are detected; speech is fluent/normal Psychiatric:  The pt has Normal affect. Lymph:  Unremarkable  CBC    Component Value Date/Time   WBC 5.0 07/23/2023 0747   RBC 3.56 (L) 07/23/2023 0747   HGB 11.1 (L) 07/23/2023 0747   HGB 10.3 (L) 04/07/2014 0435   HCT 35.7 (L) 07/23/2023 0747   HCT 32.2 (L) 04/07/2014 0435   PLT 164 07/23/2023 0747   PLT 115 (L) 04/07/2014 0435   MCV 100.3 (H) 07/23/2023 0747   MCV 90 04/07/2014 0435   MCH 31.2 07/23/2023 0747   MCHC 31.1 07/23/2023 0747   RDW 14.5 07/23/2023 0747   RDW 14.2 04/07/2014 0435   LYMPHSABS 1.2 10/29/2022 1455   LYMPHSABS 1.0 04/07/2014 0435   MONOABS 0.4 10/29/2022 1455   MONOABS 0.3 04/07/2014 0435   EOSABS 0.1 10/29/2022 1455   EOSABS 0.1 04/07/2014 0435   BASOSABS 0.0 10/29/2022 1455   BASOSABS 0.0 04/07/2014 0435    BMET    Component Value Date/Time   NA 141 07/23/2023 0747   NA 145 04/07/2014 0435   K 4.7  07/23/2023 0747   K 4.5 04/07/2014 0435   CL 106 07/23/2023 0747   CL 108 (  H) 04/07/2014 0435   CO2 25 07/23/2023 0747   CO2 31 04/07/2014 0435   GLUCOSE 104 (H) 07/23/2023 0747   GLUCOSE 90 04/07/2014 0435   BUN 52 (H) 07/23/2023 0747   BUN 25 (H) 04/07/2014 0435   CREATININE 4.66 (H) 07/23/2023 0747   CREATININE 1.95 (H) 04/07/2014 0435   CALCIUM 8.7 (L) 07/23/2023 0747   CALCIUM 7.8 (L) 04/07/2014 0435   GFRNONAA 9 (L) 07/23/2023 0747   GFRNONAA 27 (L) 04/07/2014 0435   GFRNONAA 24 (L) 12/24/2012 0435   GFRAA 24 (L) 07/06/2018 0930   GFRAA 33 (L) 04/07/2014 0435   GFRAA 28 (L) 12/24/2012 0435    COAGS: Lab Results  Component Value Date   INR 0.93 11/12/2014   INR 1.1 04/07/2014     Non-Invasive Vascular Imaging:   EXAM: CT CHEST, ABDOMEN AND PELVIS WITHOUT CONTRAST   TECHNIQUE: Multidetector CT imaging of the chest, abdomen and pelvis was performed following the standard protocol without IV contrast.   RADIATION DOSE REDUCTION: This exam was performed according to the departmental dose-optimization program which includes automated exposure control, adjustment of the mA and/or kV according to patient size and/or use of iterative reconstruction technique.   COMPARISON:  CT abdomen pelvis 12/05/2017, CT chest 02/10/2023   FINDINGS: CHEST:   Cardiovascular:   The thoracic aorta is normal in caliber. The heart is normal in size. No significant pericardial effusion. Severe atherosclerotic plaque. Four-vessel coronary calcification. Status post coronary bypass graft. The main pulmonary artery is enlarged in caliber measuring up to 3.8 cm.   Lungs/Pleura:   No focal consolidation. No pulmonary nodule. No pulmonary mass. No pulmonary contusion or laceration. No pneumatocele formation.   No pleural effusion. No pneumothorax. No hemothorax.   Mediastinum/Nodes:   No pneumomediastinum.   The central airways are patent.   The esophagus is unremarkable.  Tiny  hiatal hernia.   The thyroid is unremarkable.   Limited evaluation for hilar lymphadenopathy on this noncontrast study. Stable 1.1 cm precarinal lymph node. Otherwise no mediastinal or axillary lymphadenopathy.   Musculoskeletal/Chest wall   No chest wall mass. Acute nondisplaced left 9th rib fracture. No sternal fracture. No spinal fracture. Intact sternotomy wires.   ABDOMEN / PELVIS: Hepatobiliary:   Not enlarged. No focal lesion.   Gallbladder not visualized and likely surgically removed.   No biliary ductal dilatation.   Pancreas: Normal pancreatic contour. No main pancreatic duct dilatation.   Spleen: Not enlarged. No focal lesion.   Adrenals/Urinary Tract:   No nodularity bilaterally.   No hydroureteronephrosis. No nephroureterolithiasis.   Simple fluid density lesions within the kidneys likely represent simple renal cysts. Simple renal cysts, in the absence of clinically indicated signs/symptoms, require no independent follow-up. Interval development of hyperdensity along a known left superior renal pole cyst with associated high density perinephric fat stranding.   The urinary bladder is unremarkable.   Stomach/Bowel: No small or large bowel wall thickening or dilatation. Colonic diverticulosis. The appendix is not definitely identified with no inflammatory changes in the right lower quadrant to suggest acute appendicitis.   Vasculature/Lymphatic:   Hyperdensity along the right pelvic sidewall likely vascular coiling.   Severe atherosclerotic plaque. Interval increase in size of a 10 cm (from 6 cm in 2019) right common iliac artery aneurysm status post aorto bi-iliac stent graft repair.   No abdominal, pelvic, inguinal lymphadenopathy.   Reproductive: Uterus is unremarkable. Bilateral adnexa are unremarkable.   Other: No simple free fluid ascites. No pneumoperitoneum. No mesenteric hematoma  identified. No organized fluid collection.    Musculoskeletal:   No significant soft tissue hematoma.   No acute pelvic fracture. No spinal fracture.   Ports and Devices: Right chest wall dialysis catheter with tip terminating at the superior caval junction.   IMPRESSION: 1. Interval development of hyperdensity along a known left superior renal pole cyst with associated high density perinephric fat stranding. Findings suggestive of renal cyst with associated retroperitoneal hematoma. Markedly limited evaluation on this noncontrast study. 2. Acute nondisplaced left 9th rib fracture. No associated pneumothorax. 3. No acute intrathoracic or intrapelvic traumatic injury with limited evaluation on this noncontrast study. 4. No acute fracture or traumatic malalignment of the thoracic or lumbar spine. 5. Aortic Atherosclerosis (ICD10-I70.0) including four-vessel coronary calcifications status post coronary artery bypass graft. 6. Interval increase in size of a 10 cm (from 6 cm in 2019) right common iliac artery aneurysm status post aorto bi-iliac stent graft repair. Markedly limited evaluation on this noncontrast study.    Statin:  Yes.   Beta Blocker:  Yes.   Aspirin:  Yes.   ACEI:  No. ARB:  Yes.   CCB use:  Yes Other antiplatelets/anticoagulants:  No.    ASSESSMENT/PLAN: This is a 78 y.o. female who presents [facility] emergency department after falling out of bed approximately 2 feet to the ground earlier this morning.  Upon workup patient underwent a CT without contrast of her abdomen and pelvis revealing a ruptured left renal cyst with a retroperitoneal hematoma as well as a right iliac aneurysm that has expanded from 6 cm to 10 cm.  I have reviewed the CT scan with Dr. Festus Barren MD and it appears patient has ruptured a cyst of her left kidney which is bleeding into a retroperitoneal area on the left side and therefore will require renal embolization of her left kidney to control the hemorrhage.  Vascular surgery plans on  taking the patient to the vascular lab later today on 07/23/2023 for a left renal embolization.  I had a long detailed discussion in the emergency room with the patient and a family member at the bedside concerning the procedure, benefits, risk, and complications.  Both verbalized their understanding and wished to proceed as soon as possible today.  I answered all of their questions this morning.  Patient endorses she last had something to eat at 2 AM this morning.  This will not interfere with doing a procedure this afternoon.   -I discussed the case with Dr. Festus Barren MD in detail and he agrees with the plan.   Marcie [facility] 07/23/2023 9:06 AM

## 2023-07-23 NOTE — ED Triage Notes (Signed)
 Patient rolled out of bed approx 67ft drop complaining of left ribs.  Patient is a dialysis patient only goes 2 days a week M and F.  Hasn't missed any dialysis.  Patient tearful in triage.

## 2023-07-23 NOTE — Op Note (Signed)
 [facility]  Percutaneous Study/Intervention Procedural Note   Date of Surgery: 07/23/2023  Surgeon(s):Jabez Molner    Assistants:none  Pre-operative Diagnosis: hemorrhage from left kidney, right iliac aneurysm  Post-operative diagnosis:  Same  Procedure(s) Performed:             1.  Ultrasound guidance for vascular access right femoral artery             2.  Catheter placement into left renal artery from right femoral approach             3.  Aortogram and selective left renal angiogram             4.  Coil embolization of the left renal artery with a total of 4 Ruby coils             5.  StarClose closure device right femoral artery  EBL: 5 cc  Contrast: 55 cc  Fluoro Time: 6.9 minutes  Moderate Conscious Sedation Time: approximately 47 minutes using 3.5 mg of Versed and 125 mcg of Fentanyl              Indications:  Patient is a 78 y.o.female with a recent fall who presented with a CT scan that I have independently reviewed which showed a retroperitoneal and perinephric hematoma on the left.  She had multiple renal cysts and appeared to be bleeding from 1 of these from her fall.  She is already on dialysis with no significant residual renal function.  She is also many years status post a right iliac aneurysm repair.  The uninfused CT scan showed enlargement of the right iliac aneurysm sac up to 10 cm which was much larger than the previous imaging several years ago.  Angiogram is being performed for planned embolization of the renal artery but also to evaluate for type I or III endoleak of the iliac artery aneurysm.  She had already had coil embolization of the right hypogastric artery with extension to the right external iliac artery with her repair.  Risks and benefits are discussed and informed consent is obtained.   Procedure:  The patient was identified and appropriate procedural time out was performed.  The patient was then placed supine on the table and  prepped and draped in the usual sterile fashion. Moderate conscious sedation was administered during a face to face encounter with the patient throughout the procedure with my supervision of the RN administering medicines and monitoring the patient's vital signs, pulse oximetry, telemetry and mental status throughout from the start of the procedure until the patient was taken to the recovery room. Ultrasound was used to evaluate the right common femoral artery.  It was patent .  A digital ultrasound image was acquired.  A Seldinger needle was used to access the right common femoral artery under direct ultrasound guidance and a permanent image was performed.  A 0.035 J wire was advanced without resistance and a 5Fr sheath was placed.  Pigtail catheter was placed into the aorta and an AP aortogram was performed. This demonstrated single renal arteries bilaterally.  The right renal artery did not appear to have any proximal stenosis.  The left renal artery did have some moderate proximal stenosis.  I then used a C2 catheter to selectively cannulate the left renal artery and imaging was performed.  There was a moderate proximal stenosis of the left renal artery in the 50 to 60% range.  There was a short main renal artery with a  bifurcation into 2 main renal artery branches.  There appeared to be a slight blush from the upper pole of the kidney.  Given her preoperative scan and her clinical status already being on dialysis we had determined that embolization of this left renal artery would be appropriate to stop the bleeding.  A prograde microcatheter was then placed out the lower of the 2 main renal artery branches initially where selective imaging is performed.  I then used a pair of 5 pod coils to coil embolize this branch.  The prograde microcatheter was then taken out the more superior of the 2 main renal artery branches and a 6 pod Ruby coil was then placed embolize this branch.  We had now come back to the distal  main renal artery.  A 60 cm packing coil was then placed in the main renal artery to completely coil embolized the entirety of the main renal artery back to proximal to the adrenal branch and completely embolizing both primary renal artery branches to exclude blood flow to the left kidney.  Completion imaging through the C2 catheter after removal of the prograde microcatheter showed successful coil embolization.  No further bleeding was seen.  I then turned my attention to evaluating the aorta and iliac arteries.  The pigtail catheter was placed back in the aorta.  Aortogram was then performed.  The aortoiliac stent graft was widely patent.  No type I or III endoleak was identified on initial imaging with no obvious flow seen in the aneurysm sac.  The catheter was pulled down into the right limb of the graft and imaging in the right limb of the graft also showed no obvious type I or III endoleak and no flow within the aneurysm sac.  A retrograde angiogram performed to the right femoral sheath was also done to look specifically for a type Ib endoleak distally.  No obvious endoleak was seen with good flow through the stent graft and no blush into the aneurysm sac.  No other treatment for the aneurysm was then planned for today.  The sheath was removed and StarClose closure device was deployed in the right femoral artery with excellent hemostatic result. The patient was taken to the recovery room in stable condition having tolerated the procedure well.  Findings:               Aortogram: Single renal arteries bilaterally.  Moderate stenosis of the proximal left renal artery.  Aortoiliac stent graft was widely patent.  No obvious endoleak was identified with no blood flow seen into the aneurysm sac with images in the suprarenal aorta, within the right limb of the graft, or from the right femoral sheath.             Left renal artery: There was a moderate proximal stenosis of the left renal artery in the 50 to 60%  range.  There was a short main renal artery with a bifurcation into 2 main renal artery branches.  There appeared to be a slight blush from the upper pole of the kidney.  Disposition: Patient was taken to the recovery room in stable condition having tolerated the procedure well.  Complications: None  Festus Barren 07/23/2023 12:55 PM   This note was created with Dragon Medical transcription system. Any errors in dictation are purely unintentional.

## 2023-07-23 NOTE — H&P (Signed)
 History and Physical    Patient: Michelle Murillo ZOX:[id number] DOB: 1945-08-22 DOA: 07/23/2023 DOS: the patient was seen and examined on 07/23/2023 PCP: [facility]  Patient coming from: Home  Chief Complaint:  Chief Complaint  Patient presents with   Fall   HPI: Michelle Murillo is a 78 y.o. female with medical history significant of AAA status post repair, CHF, COPD, CAD status post CABG, type 2 diabetes, hemodialysis on Monday and Friday, GERD, hypertension, seizure disorder presented with fall, left retroperitoneal hematoma, rib fractures.  Patient noted to have rule out of bed with approximate 3 foot drop landing on her left ribs.  Patient reports significant pain on the left side with splinting and movement.  No fevers or chills.  No nausea or vomiting.  Presented to the ER because of worsening pain.  Does not report any anticoagulant use. Presented to the ER afebrile, hemodynamically stable.  Satting well on room air.  Labs notable for white count of 5, hemoglobin Levan 0.1, platelets 164, creatinine 4.66.  CT imaging today with hyperdensity along the left superior renal pole concerning for renal cysts with retroperitoneal hemorrhage, acute nondisplaced left ninth rib fracture.  10 cm common iliac artery aneurysm status post aortic biiliac stent graft repair.  Dr. Wyn Quaker with vascular surgery consulted for further evaluation. Review of Systems: As mentioned in the history of present illness. All other systems reviewed and are negative. 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  pt was in a coma for 18 days after this.(during CABG)   COPD (chronic obstructive pulmonary disease) (HCC)    Coronary artery disease    a.)  s/p 4v CABG 06/12/2011; b.) LHC 03/07/2020: 10% dRCA, 75% p-mRCA, 75% pLCx, 50% p-mLAD, 45% mLAD; SVG-RCA and SVG-OM occluded   Diabetes mellitus without complication (HCC)    borderline, does not take any medication   Dyspnea    ESRD on hemodialysis (HCC) 2019   a.) M-W-F   GERD (gastroesophageal reflux disease)    Gout    Hypertension    Legally blind    unable to see any more than shapes   Right ovarian cyst    S/P CABG x 4 06/12/2011   a.) LIMA-LAD, SVG-OM1, SVG-LCx, SVG-RCA --> procedure complicated by bradycardic arrest and respiratory failure (pulmonary edema) resulting in prolonged ICU admission. Developed rapid A.fib and required DCCV x 3 durrsing ICU admission.   Seizures (HCC) 2013   only happened during cabg procedure while on the table   Sleep apnea    a.) does not use noctural PAP therapy   Past Surgical History:  Procedure Laterality Date   A/V FISTULAGRAM Left 01/13/2023   Procedure: A/V Fistulagram;  Surgeon: Annice Needy, MD;  Location: [facility];  Service: Cardiovascular;  Laterality: Left;   ABDOMINAL AORTOGRAM N/A 06/23/2017   Procedure: ABDOMINAL AORTOGRAM;  Surgeon: Annice Needy, MD;  Location: [facility];  Service: Cardiovascular;  Laterality: N/A;   ABDOMINAL SURGERY  2017   aneurysm repair with dr. dew   AV FISTULA PLACEMENT Left 11/07/2022   Procedure: ARTERIOVENOUS (AV) FISTULA CREATION (BRACHIALCEPHALIC);  Surgeon: Annice Needy, MD;  Location: [facility] ORS;  Service:  Vascular;  Laterality: Left;   BRONCHOSCOPY  2015   done at [facility]. carcinoid of airways   CHOLECYSTECTOMY     age 15   CORONARY ARTERY BYPASS GRAFT  06/12/2011   DIALYSIS/PERMA CATHETER INSERTION N/A 06/24/2022   Procedure: DIALYSIS/PERMA CATHETER INSERTION;  Surgeon: Annice Needy, MD;  Location: [facility];  Service: Cardiovascular;  Laterality: N/A;   DIALYSIS/PERMA CATHETER INSERTION N/A 09/03/2022   Procedure: DIALYSIS/PERMA CATHETER INSERTION;  Surgeon:  Renford Dills, MD;  Location: [facility];  Service: Cardiovascular;  Laterality: N/A;   DIALYSIS/PERMA CATHETER INSERTION N/A 10/16/2022   Procedure: DIALYSIS/PERMA CATHETER INSERTION;  Surgeon: Renford Dills, MD;  Location: [facility];  Service: Cardiovascular;  Laterality: N/A;   DIALYSIS/PERMA CATHETER INSERTION N/A 10/03/2022   Procedure: DIALYSIS/PERMA CATHETER INSERTION;  Surgeon: Annice Needy, MD;  Location: [facility];  Service: Cardiovascular;  Laterality: N/A;   ENDOVASCULAR REPAIR/STENT GRAFT N/A 07/06/2018   Procedure: ENDOVASCULAR REPAIR/STENT GRAFT;  Surgeon: Annice Needy, MD;  Location: [facility];  Service: Cardiovascular;  Laterality: N/A;   laryngeal nodule surgery     LEFT HEART CATH AND CORS/GRAFTS ANGIOGRAPHY Left 03/07/2020   Procedure: LEFT HEART CATH AND CORS/GRAFTS ANGIOGRAPHY;  Surgeon: Lamar Blinks, MD;  Location: [facility];  Service: Cardiovascular;  Laterality: Left;   OOPHORECTOMY Right    Social History:  reports that she has been smoking cigarettes. She has never used smokeless tobacco. She reports that she does not drink alcohol and does not use drugs.  Allergies  Allergen Reactions   Isosorbide Nitrate Nausea And Vomiting   Bupropion Nausea Only    Other reaction(s): Other (See Comments) "I didn't do well with it.".hallucinates   Morphine And Codeine Itching    Per pt morphine only    Family History  Problem Relation Age of Onset   Congestive Heart Failure Mother    Heart attack Mother    Cancer Father    Liver cancer Father    Cancer Paternal Grandfather     Prior to Admission medications   Medication Sig Start Date End Date Taking? Authorizing Provider  albuterol (VENTOLIN HFA) 108 (90 Base) MCG/ACT inhaler Inhale 2 puffs into the lungs every 6 (six) hours as needed for wheezing or shortness of breath.    [provider]  allopurinol (ZYLOPRIM) 300 MG tablet Take 300 mg  by mouth daily.  03/14/17   [provider]  amLODipine (NORVASC) 5 MG tablet Take 5 mg by mouth daily. 08/08/21   [provider]  aspirin EC 81 MG tablet Take 81 mg by mouth daily.    [provider]  atorvastatin (LIPITOR) 20 MG tablet Take 20 mg by mouth daily. 08/16/21   [provider]  calcitRIOL (ROCALTROL) 0.25 MCG capsule Take 0.25 mcg by mouth daily.    [provider]  colchicine 0.6 MG tablet Take 0.6 mg by mouth daily. Patient not taking: Reported on 01/13/2023 08/06/22   [provider]  furosemide (LASIX) 40 MG tablet Take 40 mg by mouth daily as needed (for fluid retention (swollen ankles)---typically no more than twice a week). Takes on T, Th, Sat, Sun only if Diastolic is 60 or above    [provider]  gabapentin (NEURONTIN) 100 MG capsule Take 100 mg by mouth 3 (three) times daily.    [provider]  losartan (COZAAR) 100 MG tablet Take 100 mg by mouth daily. Patient not  taking: Reported on 01/13/2023    [provider]  metoprolol tartrate (LOPRESSOR) 50 MG tablet Take 50 mg by mouth 2 (two) times daily. 09/12/21   [provider]  pantoprazole (PROTONIX) 40 MG tablet Take 40 mg by mouth 2 (two) times daily.    [provider]  SYMBICORT 160-4.5 MCG/ACT inhaler Inhale 2 puffs into the lungs. 11/21/22   [provider]  traMADol (ULTRAM) 50 MG tablet Take 1 tablet (50 mg total) by mouth every 6 (six) hours as needed. Patient not taking: Reported on 01/13/2023 11/07/22 11/07/23  Annice Needy, MD  triamcinolone cream (KENALOG) 0.1 % Apply 1 Application topically 2 (two) times daily. 12/17/22   [provider]    Physical Exam: Vitals:   07/23/23 1250 07/23/23 1300 07/23/23 1315 07/23/23 1345  BP: 134/60 (!) 155/74 (!) 157/68 129/82  Pulse: 74 71 69 69  Resp: (!) 22 20 (!) 25 (!) 21  Temp:      TempSrc:      SpO2: 97% 98% 91% 94%  Weight:      Height:        Physical Exam Constitutional:      Appearance: She is obese.  HENT:     Head: Normocephalic and atraumatic.     Nose: Nose normal.     Mouth/Throat:     Mouth: Mucous membranes are moist.  Eyes:     Pupils: Pupils are equal, round, and reactive to light.  Cardiovascular:     Rate and Rhythm: Normal rate and regular rhythm.  Pulmonary:     Effort: Pulmonary effort is normal.  Abdominal:     General: Bowel sounds are normal.  Musculoskeletal:        General: Normal range of motion.     Cervical back: Normal range of motion.  Skin:    General: Skin is warm.  Neurological:     General: No focal deficit present.  Psychiatric:        Mood and Affect: Mood normal.     Data Reviewed:  There are no new results to review at this time.  Assessment and Plan: * Fall at home, initial encounter + traumatic fall at home w/ L sided rib pain and noted traumatic L RP hematoma w/ assd L renal cyst.  Pain control  IS  Fall precautions  Monitor    Traumatic retroperitoneal hemorrhage Noted L renal cyst with secondary retroperitoneal hemorrhage status post fall Formally evaluated by vascular surgery. Status post Coil embolization of the left renal artery with a total of 4 Ruby coils  Hemoglobin 11.1 on presentation Trend overnight Transfuse for hemoglobin less than 7 Hold offending agents  Monitor  Gout Cont allopurinol    CAD (coronary artery disease) S/p CABG  No active CP  Monitor  Hold ASA in setting of renal cyst w/ secondary RP hematoma   ESRD on dialysis (HCC) Baseline ESRD on HD M,F  Cont nephrology prn    COPD (chronic obstructive pulmonary disease) (HCC) Stable from a resp standpoint  Cont home inhaler    Hypertension BP stable  Titrate home regimen        Advance Care Planning:   Code Status: Full Code   Consults: Vascular Surgery   Family Communication: No family at the bedside   Severity of Illness: The appropriate patient status for  this patient is INPATIENT. Inpatient status is judged to be reasonable and necessary in order to provide the required intensity of service to ensure  the patient's safety. The patient's presenting symptoms, physical exam findings, and initial radiographic and laboratory data in the context of their chronic comorbidities is felt to place them at high risk for further clinical deterioration. Furthermore, it is not anticipated that the patient will be medically stable for discharge from the hospital within 2 midnights of admission.   * I certify that at the point of admission it is my clinical judgment that the patient will require inpatient hospital care spanning beyond 2 midnights from the point of admission due to high intensity of service, high risk for further deterioration and high frequency of surveillance required.*  Author: Floydene Flock, MD 07/23/2023 2:52 PM  For on call review [email or web address].

## 2023-07-23 NOTE — ED Notes (Signed)
 Dr dew's staff at bedside, sister at bedside with pt

## 2023-07-24 ENCOUNTER — Encounter: Payer: Self-pay | Admitting: Vascular Surgery

## 2023-07-24 DIAGNOSIS — I10 Essential (primary) hypertension: Secondary | ICD-10-CM | POA: Diagnosis not present

## 2023-07-24 DIAGNOSIS — F17210 Nicotine dependence, cigarettes, uncomplicated: Secondary | ICD-10-CM | POA: Diagnosis not present

## 2023-07-24 DIAGNOSIS — S37092A Other injury of left kidney, initial encounter: Secondary | ICD-10-CM | POA: Diagnosis not present

## 2023-07-24 DIAGNOSIS — Z9889 Other specified postprocedural states: Secondary | ICD-10-CM

## 2023-07-24 DIAGNOSIS — W19XXXA Unspecified fall, initial encounter: Secondary | ICD-10-CM | POA: Diagnosis not present

## 2023-07-24 DIAGNOSIS — Y92009 Unspecified place in unspecified non-institutional (private) residence as the place of occurrence of the external cause: Secondary | ICD-10-CM | POA: Diagnosis not present

## 2023-07-24 DIAGNOSIS — Z95828 Presence of other vascular implants and grafts: Secondary | ICD-10-CM

## 2023-07-24 DIAGNOSIS — N281 Cyst of kidney, acquired: Secondary | ICD-10-CM | POA: Diagnosis not present

## 2023-07-24 DIAGNOSIS — W06XXXA Fall from bed, initial encounter: Secondary | ICD-10-CM | POA: Diagnosis not present

## 2023-07-24 LAB — COMPREHENSIVE METABOLIC PANEL
ALT: 13 U/L (ref 0–44)
AST: 17 U/L (ref 15–41)
Albumin: 3.8 g/dL (ref 3.5–5.0)
Alkaline Phosphatase: 54 U/L (ref 38–126)
Anion gap: 12 (ref 5–15)
BUN: 61 mg/dL — ABNORMAL HIGH (ref 8–23)
CO2: 23 mmol/L (ref 22–32)
Calcium: 8.8 mg/dL — ABNORMAL LOW (ref 8.9–10.3)
Chloride: 106 mmol/L (ref 98–111)
Creatinine, Ser: 5.45 mg/dL — ABNORMAL HIGH (ref 0.44–1.00)
GFR, Estimated: 8 mL/min — ABNORMAL LOW (ref >60)
Glucose, Bld: 130 mg/dL — ABNORMAL HIGH (ref 70–99)
Potassium: 6.1 mmol/L — ABNORMAL HIGH (ref 3.5–5.1)
Sodium: 141 mmol/L (ref 135–145)
Total Bilirubin: 0.6 mg/dL (ref 0.0–1.2)
Total Protein: 7.2 g/dL (ref 6.5–8.1)

## 2023-07-24 LAB — CBC
HCT: 33.7 % — ABNORMAL LOW (ref 36.0–46.0)
Hemoglobin: 10.5 g/dL — ABNORMAL LOW (ref 12.0–15.0)
MCH: 31.6 pg (ref 26.0–34.0)
MCHC: 31.2 g/dL (ref 30.0–36.0)
MCV: 101.5 fL — ABNORMAL HIGH (ref 80.0–100.0)
Platelets: 144 10*3/uL — ABNORMAL LOW (ref 150–400)
RBC: 3.32 MIL/uL — ABNORMAL LOW (ref 3.87–5.11)
RDW: 14.3 % (ref 11.5–15.5)
WBC: 9.1 10*3/uL (ref 4.0–10.5)
nRBC: 0 % (ref 0.0–0.2)

## 2023-07-24 LAB — GLUCOSE, CAPILLARY
Glucose-Capillary: 103 mg/dL — ABNORMAL HIGH (ref 70–99)
Glucose-Capillary: 98 mg/dL (ref 70–99)
Glucose-Capillary: 89 mg/dL (ref 70–99)
Glucose-Capillary: 92 mg/dL (ref 70–99)
Glucose-Capillary: 100 mg/dL — ABNORMAL HIGH (ref 70–99)

## 2023-07-24 LAB — HEMOGLOBIN AND HEMATOCRIT, BLOOD
HCT: 34.4 % — ABNORMAL LOW (ref 36.0–46.0)
Hemoglobin: 10.7 g/dL — ABNORMAL LOW (ref 12.0–15.0)

## 2023-07-24 LAB — HEPATITIS B SURFACE ANTIGEN: Hepatitis B Surface Ag: NONREACTIVE

## 2023-07-24 MED ORDER — RENA-VITE PO TABS
1.0000 | ORAL_TABLET | Freq: Every day | ORAL | Status: DC
  Administered 2023-07-24: 1 via ORAL
  Filled 2023-07-24: qty 1

## 2023-07-24 MED ORDER — PENTAFLUOROPROP-TETRAFLUOROETH EX AERO: 1.0000 | INHALATION_SPRAY | CUTANEOUS | Status: DC | PRN

## 2023-07-24 MED ORDER — HEPARIN SODIUM (PORCINE) 1000 UNIT/ML DIALYSIS: 1000.0000 [IU] | INTRAMUSCULAR | Status: DC | PRN

## 2023-07-24 MED ORDER — NEPRO/CARBSTEADY PO LIQD
237.0000 mL | Freq: Three times a day (TID) | ORAL | Status: DC
  Administered 2023-07-24: 237 mL via ORAL

## 2023-07-24 MED ORDER — PENTAFLUOROPROP-TETRAFLUOROETH EX AERO
INHALATION_SPRAY | CUTANEOUS | Status: AC
  Filled 2023-07-24: qty 30

## 2023-07-24 MED ORDER — SODIUM ZIRCONIUM CYCLOSILICATE 10 G PO PACK
10.0000 g | PACK | Freq: Once | ORAL | Status: AC
  Administered 2023-07-24: 10 g via ORAL
  Filled 2023-07-24: qty 1

## 2023-07-24 MED ORDER — HYDROMORPHONE HCL 1 MG/ML IJ SOLN: 0.5000 mg | INTRAMUSCULAR | Status: DC | PRN

## 2023-07-24 MED ORDER — LIDOCAINE-PRILOCAINE 2.5-2.5 % EX CREA: 1.0000 | TOPICAL_CREAM | CUTANEOUS | Status: DC | PRN

## 2023-07-24 NOTE — Progress Notes (Addendum)
 Hemodialysis note  Received patient in bed to unit. Alert and oriented (forgetful).  Informed consent signed and in chart.  Tx duration: 3 hours  Patient tolerated well. Transported back to room, alert without acute distress.  Report given to patient's RN.   Access used: LUA AVF Access issues: Venous had small infiltration few hours after HD initiation. Patient is also forgetful and moving her left arm. Right chest port was accessed for venous.  Applied ice pack on left arm and kept elevated.  Total UF removed: 1L Medication(s) given:  none  Post HD weight: 87.7 kg   Wolfgang Phoenix [facility]

## 2023-07-24 NOTE — Progress Notes (Addendum)
 Progress Note   Patient: Michelle Murillo WUJ:[id number] DOB: Oct 27, 1945 DOA: 07/23/2023     1 DOS: the patient was seen and examined on 07/24/2023   Brief hospital course:  Michelle Murillo is a 78 y.o. female with medical history significant of AAA status post repair, CHF, COPD, CAD status post CABG, type 2 diabetes, hemodialysis on Monday and Friday, GERD, hypertension, seizure disorder presented with fall, left retroperitoneal hematoma, rib fractures.  Patient noted to have rule out of bed with approximate 3 foot drop landing on her left ribs.  CT imaging today with hyperdensity along the left superior renal pole concerning for renal cysts with retroperitoneal hemorrhage, acute nondisplaced left ninth rib fracture.  Patient with retroperitoneal hemorrhage.  Have been seen by vascular surgeon and underwent coil embolization of the left renal artery.  Assessment and Plan:  Fall at home, initial encounter + traumatic fall at home w/ L sided rib pain and noted traumatic L RP hematoma w/ assd L renal cyst.  Pain control  Continue fall precautions     Traumatic retroperitoneal hemorrhage Noted L renal cyst with secondary retroperitoneal hemorrhage status post fall Have been seen by vascular surgeon and underwent coil embolization of the left renal artery. Vascular surgeon on board and case discussed Continue monitoring CBC closely   Gout Continue allopurinol      CAD (coronary artery disease) S/p CABG  No active CP  Monitor  Hold ASA in setting of renal cyst w/ secondary RP hematoma    ESRD on dialysis (HCC) Hyperkalemia Baseline ESRD on HD M,F  Patient received Lokelma today Nephrologist planning dialysis today and case discussed     COPD (chronic obstructive pulmonary disease) (HCC) Stable from a resp standpoint  Continue inhaler therapy     Hypertension BP stable  Titrate home regimen    Advance Care Planning:   Code Status: Full Code    Consults: Vascular  Surgery    Family Communication: No family at the bedside   Subjective:  Patient seen and examined at bedside this morning Denies nausea vomiting abdominal pain chest pain or cough  Physical Exam:  HENT:     Head: Normocephalic and atraumatic.     Nose: Nose normal.     Mouth/Throat:     Mouth: Mucous membranes are moist.  Eyes:     Pupils: Pupils are equal, round, and reactive to light.  Cardiovascular:     Rate and Rhythm: Normal rate and regular rhythm.  Pulmonary:     Effort: Pulmonary effort is normal.  Abdominal:     General: Bowel sounds are normal.  Musculoskeletal:        General: Normal range of motion.     Cervical back: Normal range of motion.  Skin:    General: Skin is warm.  Neurological:     General: No focal deficit present.  Psychiatric:        Mood and Affect: Mood normal.    Vitals:   07/24/23 1500 07/24/23 1530 07/24/23 1600 07/24/23 1614  BP: (!) 151/95 (!) 146/65 128/68 (!) 132/51  Pulse: 76 76 82 83  Resp: 11 14 18 20   Temp:    98.4 F (36.9 C)  TempSrc:    Oral  SpO2: 95% 97% 95% 96%  Weight:      Height:        Data Reviewed: I have reviewed patient abdominal imaging with findings as noted above    Latest Ref Rng & Units 07/24/2023  12:38 PM 07/24/2023    2:37 AM 07/23/2023    8:13 PM  CBC  WBC 4.0 - 10.5 K/uL 9.1     Hemoglobin 12.0 - 15.0 g/dL 40.9  81.1  91.4   Hematocrit 36.0 - 46.0 % 33.7  34.4  36.1   Platelets 150 - 400 K/uL 144          Latest Ref Rng & Units 07/24/2023    2:37 AM 07/23/2023    7:47 AM 11/07/2022    6:36 AM  BMP  Glucose 70 - 99 mg/dL 782  956  213   BUN 8 - 23 mg/dL 61  52  29   Creatinine 0.44 - 1.00 mg/dL 0.86  5.78  4.69   Sodium 135 - 145 mmol/L 141  141  140   Potassium 3.5 - 5.1 mmol/L 6.1  4.7  4.9   Chloride 98 - 111 mmol/L 106  106  103   CO2 22 - 32 mmol/L 23  25    Calcium 8.9 - 10.3 mg/dL 8.8  8.7        Time spent: 45 minutes  Author: Loyce Dys, MD 07/24/2023 4:20 PM  For on  call review [email or web address].

## 2023-07-24 NOTE — Plan of Care (Signed)

## 2023-07-24 NOTE — Progress Notes (Signed)
 Progress Note    07/24/2023 10:18 AM 1 Day Post-Op  Subjective:  Michelle Murillo is a 78 yo female who is now POD #1 from aortogram with selective left renal angiogram with coil embolization of the left renal artery with a total of 4 Ruby coils.  Patient is resting comfortably in bed this morning.  Endorses some right groin pain.  No other complaints overnight.  Vitals all remained stable.   Vitals:   07/24/23 0155 07/24/23 0726  BP: (!) 146/66 (!) 144/64  Pulse: 74 74  Resp: 18 20  Temp: 97.9 F (36.6 C) 98.2 F (36.8 C)  SpO2: 98% 100%   Physical Exam: Cardiac:  RRR, without  Murmurs, rubs or gallops; without carotid bruits  Lungs:  normal non-labored breathing, without Rales, rhonchi, wheezing  Incisions: Right groin with dressing clean dry and intact.  No hematoma seroma to note. Extremities: Palpable pulses throughout, bilateral lower extremities warm to touch. Abdomen: Positive bowel sounds throughout, soft, nontender and nondistended. Neurologic: A&O X 3; No focal weakness or paresthesias are detected; speech is fluent/normal   CBC    Component Value Date/Time   WBC 5.0 07/23/2023 0747   RBC 3.56 (L) 07/23/2023 0747   HGB 10.7 (L) 07/24/2023 0237   HGB 10.3 (L) 04/07/2014 0435   HCT 34.4 (L) 07/24/2023 0237   HCT 32.2 (L) 04/07/2014 0435   PLT 164 07/23/2023 0747   PLT 115 (L) 04/07/2014 0435   MCV 100.3 (H) 07/23/2023 0747   MCV 90 04/07/2014 0435   MCH 31.2 07/23/2023 0747   MCHC 31.1 07/23/2023 0747   RDW 14.5 07/23/2023 0747   RDW 14.2 04/07/2014 0435   LYMPHSABS 1.2 10/29/2022 1455   LYMPHSABS 1.0 04/07/2014 0435   MONOABS 0.4 10/29/2022 1455   MONOABS 0.3 04/07/2014 0435   EOSABS 0.1 10/29/2022 1455   EOSABS 0.1 04/07/2014 0435   BASOSABS 0.0 10/29/2022 1455   BASOSABS 0.0 04/07/2014 0435    BMET    Component Value Date/Time   NA 141 07/24/2023 0237   NA 145 04/07/2014 0435   K 6.1 (H) 07/24/2023 0237   K 4.5 04/07/2014 0435   CL 106  07/24/2023 0237   CL 108 (H) 04/07/2014 0435   CO2 23 07/24/2023 0237   CO2 31 04/07/2014 0435   GLUCOSE 130 (H) 07/24/2023 0237   GLUCOSE 90 04/07/2014 0435   BUN 61 (H) 07/24/2023 0237   BUN 25 (H) 04/07/2014 0435   CREATININE 5.45 (H) 07/24/2023 0237   CREATININE 1.95 (H) 04/07/2014 0435   CALCIUM 8.8 (L) 07/24/2023 0237   CALCIUM 7.8 (L) 04/07/2014 0435   GFRNONAA 8 (L) 07/24/2023 0237   GFRNONAA 27 (L) 04/07/2014 0435   GFRNONAA 24 (L) 12/24/2012 0435   GFRAA 24 (L) 07/06/2018 0930   GFRAA 33 (L) 04/07/2014 0435   GFRAA 28 (L) 12/24/2012 0435    INR    Component Value Date/Time   INR 0.93 11/12/2014 1645   INR 1.1 04/07/2014 0435     Intake/Output Summary (Last 24 hours) at 07/24/2023 1018 Last data filed at 07/23/2023 1400 Gross per 24 hour  Intake 0 ml  Output --  Net 0 ml     Assessment/Plan:  78 y.o. female is s/p aortogram with selective left renal angiogram with coil embolization of the left renal artery with a total of 4 Ruby coils.  1 Day Post-Op   PLAN Pain medication as needed PT/OT consult Fall precautions Monitor daily CBCs to assess for continued  bleeding.  Transfuse for hemoglobin less than 7. Okay per vascular surgery for patient discharge when medically stable.  DVT prophylaxis: On hold due to renal cyst bleeding with retroperitoneal bleed.   Marcie Bal [facility] 07/24/2023 10:18 AM

## 2023-07-24 NOTE — Progress Notes (Signed)
       CROSS COVER NOTE  NAME: Michelle Murillo MRN: [id number] DOB : Sep 24, 1945 ATTENDING PHYSICIAN: Loyce Dys, MD    Date of Service   07/24/2023   HPI/Events of Note   Reported from nurse patient was A & O times four on admit and had change in mentation just before shift change  Interventions   Assessment/Plan:    07/24/2023    8:14 PM 07/24/2023    5:39 PM 07/24/2023    4:30 PM  Vitals with BMI  Systolic 131 141 409  Diastolic 50 56 93  Pulse 81 84 86   Alert and oriented to self, was able to reoriented to  place briefly. Frequent reminders were needed, Face symmetrical. Extremity movement symmedtrical. Speech clear. Did mention her some Gelene Mink "running around" CBG 100 Delirium precautions       Donnie Mesa NP [facility] Cross Cover 7pm-7am - check amion for availability Pager [phone]

## 2023-07-24 NOTE — Progress Notes (Signed)
 [facility]  ROUNDING NOTE   Subjective:   Michelle Murillo is a 78 year old female with past medical conditions including CAD status post CABG, type 2 diabetes, CHF, COPD, GERD, hypertension, and end-stage renal disease on hemodialysis.  Patient presents to the emergency department after suffering a fall.  She has been admitted for Fall [W19.XXXA]  Patient is known to our practice and receives outpatient dialysis treatments at [facility] on a Monday/Friday schedule, supervised by Dr. Lourdes Sledge.  Last treatment received on Monday. Patient is seen and evaluated during dialysis.   HEMODIALYSIS FLOWSHEET:  Blood Flow Rate (mL/min): 349 mL/min Arterial Pressure (mmHg): -158.58 mmHg Venous Pressure (mmHg): 153.53 mmHg TMP (mmHg): 14.54 mmHg Ultrafiltration Rate (mL/min): 739 mL/min Dialysate Flow Rate (mL/min): 299 ml/min  She suffered a 3 foot fall from her bed. Complained of left rib pain. She was found to have retroperitoneal hemorrhage.   Labs stable for renal patient.   We have been consulted to manage dialysis needs during this admission.   Objective:  Vital signs in last 24 hours:  Temp:  [97.7 F (36.5 C)-98.2 F (36.8 C)] 97.8 F (36.6 C) (02/27 1154) Pulse Rate:  [67-81] 81 (02/27 1300) Resp:  [10-20] 18 (02/27 1300) BP: (134-146)/(55-66) 137/55 (02/27 1300) SpO2:  [91 %-100 %] 94 % (02/27 1300) Weight:  [88.6 kg] 88.6 kg (02/27 1154)  Weight change:  Filed Weights   07/23/23 0739 07/24/23 1154  Weight: 89.8 kg 88.6 kg    Intake/Output: No intake/output data recorded.   Intake/Output this shift:  No intake/output data recorded.  Physical Exam: General: NAD  Head: Normocephalic, atraumatic. Moist oral mucosal membranes  Eyes: Anicteric  Lungs:  Clear to auscultation, normal effort  Heart: Regular rate and rhythm  Abdomen:  Soft, nontender  Extremities:  No peripheral edema.  Neurologic: Nonfocal, moving all four extremities  Skin: No  lesions  Access: Lt AVF, Rt Permcath    Basic Metabolic Panel: Recent Labs  Lab 07/23/23 0747 07/24/23 0237  NA 141 141  K 4.7 6.1*  CL 106 106  CO2 25 23  GLUCOSE 104* 130*  BUN 52* 61*  CREATININE 4.66* 5.45*  CALCIUM 8.7* 8.8*    Liver Function Tests: Recent Labs  Lab 07/24/23 0237  AST 17  ALT 13  ALKPHOS 54  BILITOT 0.6  PROT 7.2  ALBUMIN 3.8   No results for input(s): "LIPASE", "AMYLASE" in the last 168 hours. No results for input(s): "AMMONIA" in the last 168 hours.  CBC: Recent Labs  Lab 07/23/23 0747 07/23/23 1505 07/23/23 2013 07/24/23 0237 07/24/23 1238  WBC 5.0  --   --   --  9.1  HGB 11.1* 10.7* 11.0* 10.7* 10.5*  HCT 35.7* 34.6* 36.1 34.4* 33.7*  MCV 100.3*  --   --   --  101.5*  PLT 164  --   --   --  144*    Cardiac Enzymes: No results for input(s): "CKTOTAL", "CKMB", "CKMBINDEX", "TROPONINI" in the last 168 hours.  BNP: Invalid input(s): "POCBNP"  CBG: Recent Labs  Lab 07/23/23 1715 07/23/23 2031 07/24/23 0727 07/24/23 1156  GLUCAP 121* 128* 103* 98    Microbiology: Results for orders placed or performed in visit on 08/20/21  CULTURE, URINE COMPREHENSIVE     Status: None   Collection Time: 08/20/21 10:21 AM   Specimen: Urine   UR  Result Value Ref Range Status   Urine Culture, Comprehensive Final report  Final   Organism ID, Bacteria Comment  Final    Comment: Mixed urogenital flora 3,000 Colonies/mL     Coagulation Studies: No results for input(s): "LABPROT", "INR" in the last 72 hours.  Urinalysis: No results for input(s): "COLORURINE", "LABSPEC", "PHURINE", "GLUCOSEU", "HGBUR", "BILIRUBINUR", "KETONESUR", "PROTEINUR", "UROBILINOGEN", "NITRITE", "LEUKOCYTESUR" in the last 72 hours.  Invalid input(s): "APPERANCEUR"    Imaging: PERIPHERAL VASCULAR CATHETERIZATION Result Date: 07/23/2023 See surgical note for result.  CT CHEST ABDOMEN PELVIS WO CONTRAST Result Date: 07/23/2023 CLINICAL DATA:  Polytrauma, blunt  Fall, tenderness left lateral chest through left lateral abdomen. Dialysis pt (still makes urine each day). EXAM: CT CHEST, ABDOMEN AND PELVIS WITHOUT CONTRAST TECHNIQUE: Multidetector CT imaging of the chest, abdomen and pelvis was performed following the standard protocol without IV contrast. RADIATION DOSE REDUCTION: This exam was performed according to the departmental dose-optimization program which includes automated exposure control, adjustment of the mA and/or kV according to patient size and/or use of iterative reconstruction technique. COMPARISON:  CT abdomen pelvis 12/05/2017, CT chest 02/10/2023 FINDINGS: CHEST: Cardiovascular: The thoracic aorta is normal in caliber. The heart is normal in size. No significant pericardial effusion. Severe atherosclerotic plaque. Four-vessel coronary calcification. Status post coronary bypass graft. The main pulmonary artery is enlarged in caliber measuring up to 3.8 cm. Lungs/Pleura: No focal consolidation. No pulmonary nodule. No pulmonary mass. No pulmonary contusion or laceration. No pneumatocele formation. No pleural effusion. No pneumothorax. No hemothorax. Mediastinum/Nodes: No pneumomediastinum. The central airways are patent. The esophagus is unremarkable.  Tiny hiatal hernia. The thyroid is unremarkable. Limited evaluation for hilar lymphadenopathy on this noncontrast study. Stable 1.1 cm precarinal lymph node. Otherwise no mediastinal or axillary lymphadenopathy. Musculoskeletal/Chest wall No chest wall mass. Acute nondisplaced left 9th rib fracture. No sternal fracture. No spinal fracture. Intact sternotomy wires. ABDOMEN / PELVIS: Hepatobiliary: Not enlarged. No focal lesion. Gallbladder not visualized and likely surgically removed. No biliary ductal dilatation. Pancreas: Normal pancreatic contour. No main pancreatic duct dilatation. Spleen: Not enlarged. No focal lesion. Adrenals/Urinary Tract: No nodularity bilaterally. No hydroureteronephrosis. No  nephroureterolithiasis. Simple fluid density lesions within the kidneys likely represent simple renal cysts. Simple renal cysts, in the absence of clinically indicated signs/symptoms, require no independent follow-up. Interval development of hyperdensity along a known left superior renal pole cyst with associated high density perinephric fat stranding. The urinary bladder is unremarkable. Stomach/Bowel: No small or large bowel wall thickening or dilatation. Colonic diverticulosis. The appendix is not definitely identified with no inflammatory changes in the right lower quadrant to suggest acute appendicitis. Vasculature/Lymphatic: Hyperdensity along the right pelvic sidewall likely vascular coiling. Severe atherosclerotic plaque. Interval increase in size of a 10 cm (from 6 cm in 2019) right common iliac artery aneurysm status post aorto bi-iliac stent graft repair. No abdominal, pelvic, inguinal lymphadenopathy. Reproductive: Uterus is unremarkable. Bilateral adnexa are unremarkable. Other: No simple free fluid ascites. No pneumoperitoneum. No mesenteric hematoma identified. No organized fluid collection. Musculoskeletal: No significant soft tissue hematoma. No acute pelvic fracture. No spinal fracture. Ports and Devices: Right chest wall dialysis catheter with tip terminating at the superior caval junction. IMPRESSION: 1. Interval development of hyperdensity along a known left superior renal pole cyst with associated high density perinephric fat stranding. Findings suggestive of renal cyst with associated retroperitoneal hematoma. Markedly limited evaluation on this noncontrast study. 2. Acute nondisplaced left 9th rib fracture. No associated pneumothorax. 3. No acute intrathoracic or intrapelvic traumatic injury with limited evaluation on this noncontrast study. 4. No acute fracture or traumatic malalignment of the thoracic or lumbar spine. 5. Aortic  Atherosclerosis (ICD10-I70.0) including four-vessel coronary  calcifications status post coronary artery bypass graft. 6. Interval increase in size of a 10 cm (from 6 cm in 2019) right common iliac artery aneurysm status post aorto bi-iliac stent graft repair. Markedly limited evaluation on this noncontrast study. Electronically Signed   By: Tish Frederickson M.D.   On: 07/23/2023 08:35     Medications:    sodium chloride      Chlorhexidine Gluconate Cloth  6 each Topical Daily   feeding supplement (NEPRO CARB STEADY)  237 mL Oral TID BM   insulin aspart  0-9 Units Subcutaneous TID WC   multivitamin  1 tablet Oral QHS   sodium chloride flush  3 mL Intravenous Q12H   sodium chloride, heparin, HYDROmorphone (DILAUDID) injection, HYDROmorphone (DILAUDID) injection, lidocaine-prilocaine, ondansetron **OR** ondansetron (ZOFRAN) IV, mouth rinse, oxyCODONE, pentafluoroprop-tetrafluoroeth, sodium chloride flush  Assessment/ Plan:  Ms. Michelle Murillo is a 78 y.o.  female with past medical conditions including CAD status post CABG, type 2 diabetes, CHF, COPD, GERD, hypertension, and end-stage renal disease on hemodialysis.  Patient presents to the emergency department after suffering a fall.  She has been admitted for Fall [W19.XXXA]   End stage renal disease with hyperkalemia on hemodialysis. Last treatment received on Monday. Receiving treatment today to treatment elevated potassium. Potassium 6.1. Will dialyze on 2K bath. Will evaluate need for additional treatment in am.   2. Anemia of chronic kidney disease Lab Results  Component Value Date   HGB 10.5 (L) 07/24/2023    Hgb within desired range.   3. Secondary Hyperparathyroidism: with outpatient labs:   Lab Results  Component Value Date   CALCIUM 8.8 (L) 07/24/2023   CAION 1.18 11/07/2022   PHOS 4.4 04/07/2014  Prescribed calcitriol outpatient.  Will continue to monitor bone minerals.   4. Hypertension with chronic kidney disease. Home regimen includes amlodipine, furosemide and losartan.     LOS: 1 Craig Wisnewski 2/27/20251:51 PM

## 2023-07-25 DIAGNOSIS — W06XXXA Fall from bed, initial encounter: Secondary | ICD-10-CM | POA: Diagnosis not present

## 2023-07-25 DIAGNOSIS — N281 Cyst of kidney, acquired: Secondary | ICD-10-CM | POA: Diagnosis not present

## 2023-07-25 DIAGNOSIS — F17210 Nicotine dependence, cigarettes, uncomplicated: Secondary | ICD-10-CM | POA: Diagnosis not present

## 2023-07-25 DIAGNOSIS — S37092A Other injury of left kidney, initial encounter: Secondary | ICD-10-CM | POA: Diagnosis not present

## 2023-07-25 DIAGNOSIS — Y92009 Unspecified place in unspecified non-institutional (private) residence as the place of occurrence of the external cause: Secondary | ICD-10-CM | POA: Diagnosis not present

## 2023-07-25 DIAGNOSIS — W19XXXA Unspecified fall, initial encounter: Secondary | ICD-10-CM | POA: Diagnosis not present

## 2023-07-25 DIAGNOSIS — J449 Chronic obstructive pulmonary disease, unspecified: Secondary | ICD-10-CM | POA: Diagnosis not present

## 2023-07-25 LAB — CBC WITH DIFFERENTIAL/PLATELET
Abs Immature Granulocytes: 0.18 10*3/uL — ABNORMAL HIGH (ref 0.00–0.07)
Basophils Absolute: 0 10*3/uL (ref 0.0–0.1)
Basophils Relative: 0 %
Eosinophils Absolute: 0 10*3/uL (ref 0.0–0.5)
Eosinophils Relative: 0 %
HCT: 33.2 % — ABNORMAL LOW (ref 36.0–46.0)
Hemoglobin: 10.6 g/dL — ABNORMAL LOW (ref 12.0–15.0)
Immature Granulocytes: 1 %
Lymphocytes Relative: 4 %
Lymphs Abs: 0.7 10*3/uL (ref 0.7–4.0)
MCH: 31.1 pg (ref 26.0–34.0)
MCHC: 31.9 g/dL (ref 30.0–36.0)
MCV: 97.4 fL (ref 80.0–100.0)
Monocytes Absolute: 0.9 10*3/uL (ref 0.1–1.0)
Monocytes Relative: 6 %
Neutro Abs: 13.4 10*3/uL — ABNORMAL HIGH (ref 1.7–7.7)
Neutrophils Relative %: 89 %
Platelets: 125 10*3/uL — ABNORMAL LOW (ref 150–400)
RBC: 3.41 MIL/uL — ABNORMAL LOW (ref 3.87–5.11)
RDW: 14.1 % (ref 11.5–15.5)
WBC: 15.2 10*3/uL — ABNORMAL HIGH (ref 4.0–10.5)
nRBC: 0 % (ref 0.0–0.2)

## 2023-07-25 LAB — BASIC METABOLIC PANEL
Anion gap: 11 (ref 5–15)
BUN: 37 mg/dL — ABNORMAL HIGH (ref 8–23)
CO2: 27 mmol/L (ref 22–32)
Calcium: 8.6 mg/dL — ABNORMAL LOW (ref 8.9–10.3)
Chloride: 97 mmol/L — ABNORMAL LOW (ref 98–111)
Creatinine, Ser: 4.33 mg/dL — ABNORMAL HIGH (ref 0.44–1.00)
GFR, Estimated: 10 mL/min — ABNORMAL LOW (ref >60)
Glucose, Bld: 115 mg/dL — ABNORMAL HIGH (ref 70–99)
Potassium: 4.2 mmol/L (ref 3.5–5.1)
Sodium: 135 mmol/L (ref 135–145)

## 2023-07-25 LAB — GLUCOSE, CAPILLARY
Glucose-Capillary: 122 mg/dL — ABNORMAL HIGH (ref 70–99)
Glucose-Capillary: 115 mg/dL — ABNORMAL HIGH (ref 70–99)

## 2023-07-25 MED ORDER — TRAMADOL HCL 50 MG PO TABS: 50.0000 mg | ORAL_TABLET | Freq: Four times a day (QID) | ORAL | 0 refills | Status: AC | PRN

## 2023-07-25 NOTE — Progress Notes (Signed)
 Progress Note    07/25/2023 1:28 PM 2 Days Post-Op  Subjective:   Michelle Murillo is a 78 yo female who is now POD #2 from aortogram with selective left renal angiogram with coil embolization of the left renal artery with a total of 4 Ruby coils.  Patient is resting comfortably in bed this morning.  Endorses some right groin pain.  No other complaints overnight.  Vitals all remained stable.    Vitals:   07/25/23 0302 07/25/23 0830  BP: (!) 112/59 (!) 139/56  Pulse: 90 (!) 103  Resp: 18 18  Temp: 99.7 F (37.6 C) 98.4 F (36.9 C)  SpO2: 94% 95%   Physical Exam: Cardiac:  RRR, without  Murmurs, rubs or gallops; without carotid bruits  Lungs:  normal non-labored breathing, without Rales, rhonchi, wheezing  Incisions: Right groin with dressing clean dry and intact.  No hematoma seroma to note. Extremities: Palpable pulses throughout, bilateral lower extremities warm to touch. Abdomen: Positive bowel sounds throughout, soft, nontender and nondistended. Neurologic: A&O X 3; No focal weakness or paresthesias are detected; speech is fluent/normal   CBC    Component Value Date/Time   WBC 15.2 (H) 07/25/2023 0546   RBC 3.41 (L) 07/25/2023 0546   HGB 10.6 (L) 07/25/2023 0546   HGB 10.3 (L) 04/07/2014 0435   HCT 33.2 (L) 07/25/2023 0546   HCT 32.2 (L) 04/07/2014 0435   PLT 125 (L) 07/25/2023 0546   PLT 115 (L) 04/07/2014 0435   MCV 97.4 07/25/2023 0546   MCV 90 04/07/2014 0435   MCH 31.1 07/25/2023 0546   MCHC 31.9 07/25/2023 0546   RDW 14.1 07/25/2023 0546   RDW 14.2 04/07/2014 0435   LYMPHSABS 0.7 07/25/2023 0546   LYMPHSABS 1.0 04/07/2014 0435   MONOABS 0.9 07/25/2023 0546   MONOABS 0.3 04/07/2014 0435   EOSABS 0.0 07/25/2023 0546   EOSABS 0.1 04/07/2014 0435   BASOSABS 0.0 07/25/2023 0546   BASOSABS 0.0 04/07/2014 0435    BMET    Component Value Date/Time   NA 135 07/25/2023 0546   NA 145 04/07/2014 0435   K 4.2 07/25/2023 0546   K 4.5 04/07/2014 0435   CL 97 (L)  07/25/2023 0546   CL 108 (H) 04/07/2014 0435   CO2 27 07/25/2023 0546   CO2 31 04/07/2014 0435   GLUCOSE 115 (H) 07/25/2023 0546   GLUCOSE 90 04/07/2014 0435   BUN 37 (H) 07/25/2023 0546   BUN 25 (H) 04/07/2014 0435   CREATININE 4.33 (H) 07/25/2023 0546   CREATININE 1.95 (H) 04/07/2014 0435   CALCIUM 8.6 (L) 07/25/2023 0546   CALCIUM 7.8 (L) 04/07/2014 0435   GFRNONAA 10 (L) 07/25/2023 0546   GFRNONAA 27 (L) 04/07/2014 0435   GFRNONAA 24 (L) 12/24/2012 0435   GFRAA 24 (L) 07/06/2018 0930   GFRAA 33 (L) 04/07/2014 0435   GFRAA 28 (L) 12/24/2012 0435    INR    Component Value Date/Time   INR 0.93 11/12/2014 1645   INR 1.1 04/07/2014 0435     Intake/Output Summary (Last 24 hours) at 07/25/2023 1328 Last data filed at 07/25/2023 0900 Gross per 24 hour  Intake 123 ml  Output 1150 ml  Net -1027 ml     Assessment/Plan:  78 y.o. female is s/p aortogram with selective left renal angiogram with coil embolization of the left renal artery with a total of 4 Ruby coils.  2 Days Post-Op   PLAN Patient CBC has remained stable overnight.  Vascular surgery is  okay with patient discharge today to home.  Further evidence of hemorrhage or increasing retroperitoneal bleed.  Patient is not reporting any pain this morning. Okay to discharge today patient to follow-up in vein and vascular clinic in 3 to 4 weeks with the EVAR ultrasounds to evaluate left femoral pseudoaneurysm.  DVT prophylaxis:  None   Marcie Bal [facility] 07/25/2023 1:28 PM

## 2023-07-25 NOTE — TOC Initial Note (Signed)
 Transition of Care [facility]) - Initial/Assessment Note    Patient Details  Name: Michelle Murillo MRN: [id number] Date of Birth: 06-13-1945  [facility] CM/SW Contact:    Liliana Cline, LCSW Phone Number: 07/25/2023, 3:53 PM  Clinical Narrative:                 Notified by MD that patient would benefit from HHPT. Per RN, patient with some bouts of confusion but mostly a&o x 4, sister to pick up. Called sister [name] who states the other sister [name] is picking patient up today, and defers to patient to decide about home health. CSW spoke with patient at bedside. PCP is [facility]. Pharmacy is [facility] order or [facility]. Patient goes to [facility] for dialysis and uses [facility] to get there, sister transports her home. Patient states she is agreeable to HHPT - no agency preference. Referral made to [facility] with [facility]   Expected Discharge Plan: Home w Home Health Services Barriers to Discharge: Barriers Resolved   Patient Goals and CMS Choice Patient states their goals for this hospitalization and ongoing recovery are:: home with home health CMS Medicare.gov Compare Post Acute Care list provided to:: Patient Choice offered to / list presented to : Patient, Sibling      Expected Discharge Plan and Services       Living arrangements for the past 2 months: Single Family Home Expected Discharge Date: 07/25/23                         [facility] Arranged: PT HH Agency: [facility] Date [facility] Agency Contacted: 07/25/23   Representative spoke with at [facility] Agency: Kandee Keen  Prior Living Arrangements/Services Living arrangements for the past 2 months: Single Family Home Lives with:: Self Patient language and need for interpreter reviewed:: Yes Do you feel safe going back to the place where you live?: Yes      Need for Family Participation in Patient Care: Yes (Comment) Care giver support system in place?: Yes (comment)   Criminal  Activity/Legal Involvement Pertinent to Current Situation/Hospitalization: No - Comment as needed  Activities of Daily Living   ADL Screening (condition at time of admission) Independently performs ADLs?: Yes (appropriate for developmental age) Is the patient deaf or have difficulty hearing?: No Does the patient have difficulty seeing, even when wearing glasses/contacts?: Yes Does the patient have difficulty concentrating, remembering, or making decisions?: No  Permission Sought/Granted Permission sought to share information with : Facility Industrial/product designer granted to share information with : Yes, Verbal Permission Granted     Permission granted to share info w AGENCY: Home Health        Emotional Assessment       Orientation: : Oriented to Self, Oriented to Place, Oriented to  Time, Oriented to Situation Alcohol / Substance Use: Not Applicable Psych Involvement: No (comment)  Admission diagnosis:  Fall [W19.XXXA] Patient Active Problem List   Diagnosis Date Noted   Fall at home, initial encounter 07/23/2023   Traumatic retroperitoneal hemorrhage 07/23/2023   Renal cyst, left 07/23/2023   CAD (coronary artery disease) 07/23/2023   Gout 07/23/2023   ESRD on dialysis (HCC) 08/20/2022   Angina pectoris (HCC) 02/21/2020   Hypertension 03/21/2017   COPD (chronic obstructive pulmonary disease) (HCC) 03/21/2017   Diabetes mellitus without complication (HCC) 03/21/2017   Chronic kidney disease (CKD), stage IV (severe) (HCC) 03/21/2017   AAA (abdominal  aortic aneurysm) without rupture (HCC) 03/21/2017   PCP:  [facility]:   [facility] [address], [place] - [address] [place] [address] Phone: [phone] Fax: [phone]  [facility] - [place], [place] - [address] [place] [address] Phone: [phone] Fax: [phone]     Social Drivers of  Health (SDOH) Social History: SDOH Screenings   Food Insecurity: No Food Insecurity (07/23/2023)  Housing: Low Risk  (07/23/2023)  Transportation Needs: No Transportation Needs (07/23/2023)  Utilities: Not At Risk (07/23/2023)  Social Connections: Moderately Integrated (07/23/2023)  Tobacco Use: High Risk (07/23/2023)   SDOH Interventions:     Readmission Risk Interventions     No data to display

## 2023-07-25 NOTE — Progress Notes (Signed)
 [facility]  ROUNDING NOTE   Subjective:   Michelle Murillo is a 78 year old female with past medical conditions including CAD status post CABG, type 2 diabetes, CHF, COPD, GERD, hypertension, and end-stage renal disease on hemodialysis.  Patient presents to the emergency department after suffering a fall.  She has been admitted for Fall [W19.XXXA]  Patient is known to our practice and receives outpatient dialysis treatments at [facility] on a Monday/Friday schedule, supervised by Dr. Cherylann Ratel.  Patient seen sitting up in chair Family member assisting with ADLs Weaned to room air States she feels much better today Reports some confusion overnight after medication.   Objective:  Vital signs in last 24 hours:  Temp:  [98.4 F (36.9 C)-99.7 F (37.6 C)] 98.4 F (36.9 C) (02/28 0830) Pulse Rate:  [76-103] 103 (02/28 0830) Resp:  [11-22] 18 (02/28 0830) BP: (112-151)/(50-95) 139/56 (02/28 0830) SpO2:  [90 %-97 %] 95 % (02/28 0830) Weight:  [87.7 kg] 87.7 kg (02/27 1614)  Weight change: -1.212 kg Filed Weights   07/23/23 0739 07/24/23 1154 07/24/23 1614  Weight: 89.8 kg 88.6 kg 87.7 kg    Intake/Output: I/O last 3 completed shifts: In: 3 [I.V.:3] Out: 1150 [Emesis/NG output:150; Other:1000]   Intake/Output this shift:  Total I/O In: 120 [P.O.:120] Out: -   Physical Exam: General: NAD  Head: Normocephalic, atraumatic. Moist oral mucosal membranes  Eyes: Anicteric  Lungs:  Clear to auscultation, normal effort  Heart: Regular rate and rhythm  Abdomen:  Soft, nontender  Extremities:  No peripheral edema.  Neurologic: Alert, moving all four extremities  Skin: No lesions  Access: Lt AVF, Rt Permcath    Basic Metabolic Panel: Recent Labs  Lab 07/23/23 0747 07/24/23 0237 07/25/23 0546  NA 141 141 135  K 4.7 6.1* 4.2  CL 106 106 97*  CO2 25 23 27   GLUCOSE 104* 130* 115*  BUN 52* 61* 37*  CREATININE 4.66* 5.45* 4.33*  CALCIUM 8.7* 8.8* 8.6*     Liver Function Tests: Recent Labs  Lab 07/24/23 0237  AST 17  ALT 13  ALKPHOS 54  BILITOT 0.6  PROT 7.2  ALBUMIN 3.8   No results for input(s): "LIPASE", "AMYLASE" in the last 168 hours. No results for input(s): "AMMONIA" in the last 168 hours.  CBC: Recent Labs  Lab 07/23/23 0747 07/23/23 1505 07/23/23 2013 07/24/23 0237 07/24/23 1238 07/25/23 0546  WBC 5.0  --   --   --  9.1 15.2*  NEUTROABS  --   --   --   --   --  13.4*  HGB 11.1* 10.7* 11.0* 10.7* 10.5* 10.6*  HCT 35.7* 34.6* 36.1 34.4* 33.7* 33.2*  MCV 100.3*  --   --   --  101.5* 97.4  PLT 164  --   --   --  144* 125*    Cardiac Enzymes: No results for input(s): "CKTOTAL", "CKMB", "CKMBINDEX", "TROPONINI" in the last 168 hours.  BNP: Invalid input(s): "POCBNP"  CBG: Recent Labs  Lab 07/24/23 1417 07/24/23 1730 07/24/23 2119 07/25/23 0832 07/25/23 1159  GLUCAP 89 92 100* 122* 115*    Microbiology: Results for orders placed or performed in visit on 08/20/21  CULTURE, URINE COMPREHENSIVE     Status: None   Collection Time: 08/20/21 10:21 AM   Specimen: Urine   UR  Result Value Ref Range Status   Urine Culture, Comprehensive Final report  Final   Organism ID, Bacteria Comment  Final    Comment: Mixed urogenital  flora 3,000 Colonies/mL     Coagulation Studies: No results for input(s): "LABPROT", "INR" in the last 72 hours.  Urinalysis: No results for input(s): "COLORURINE", "LABSPEC", "PHURINE", "GLUCOSEU", "HGBUR", "BILIRUBINUR", "KETONESUR", "PROTEINUR", "UROBILINOGEN", "NITRITE", "LEUKOCYTESUR" in the last 72 hours.  Invalid input(s): "APPERANCEUR"    Imaging: No results found.    Medications:      Chlorhexidine Gluconate Cloth  6 each Topical Daily   feeding supplement (NEPRO CARB STEADY)  237 mL Oral TID BM   insulin aspart  0-9 Units Subcutaneous TID WC   multivitamin  1 tablet Oral QHS   sodium chloride flush  3 mL Intravenous Q12H   HYDROmorphone (DILAUDID)  injection, HYDROmorphone (DILAUDID) injection, ondansetron **OR** ondansetron (ZOFRAN) IV, mouth rinse, oxyCODONE, sodium chloride flush  Assessment/ Plan:  Ms. Michelle Murillo is a 78 y.o.  female with past medical conditions including CAD status post CABG, type 2 diabetes, CHF, COPD, GERD, hypertension, and end-stage renal disease on hemodialysis.  Patient presents to the emergency department after suffering a fall.  She has been admitted for Fall [W19.XXXA]   End stage renal disease with hyperkalemia on hemodialysis.  Patient received dialysis yesterday.  Mild confusion during treatment caused infiltration.  Treatment completed utilizing 1 needle and 1 PermCath port.  Next treatment scheduled for Monday.  Patient cleared to discharge from renal stents and continue outpatient dialysis at assigned clinic.  2. Anemia of chronic kidney disease Lab Results  Component Value Date   HGB 10.6 (L) 07/25/2023    Hgb at goal.  No need for ESA's at this time.  3. Secondary Hyperparathyroidism: with outpatient labs:   Lab Results  Component Value Date   CALCIUM 8.6 (L) 07/25/2023   CAION 1.18 11/07/2022   PHOS 4.4 04/07/2014  Prescribed calcitriol outpatient.  Calcium within optimal range for renal patient.  4. Hypertension with chronic kidney disease. Home regimen includes amlodipine, furosemide and losartan.  Blood pressure acceptable for this patient.   LOS: 2 Ariez Neilan 2/28/20251:02 PM

## 2023-07-25 NOTE — Discharge Summary (Signed)
 Physician Discharge Summary   Patient: Michelle Murillo MRN: [id number] DOB: Jul 07, 1945  Admit date:     07/23/2023  Discharge date: 07/25/23  Discharge Physician: Loyce Dys   PCP: [facility]   Recommendations at discharge:  Follow-up with nephrology and vascular surgery  Discharge Diagnoses: Fall at home, initial encounter Traumatic retroperitoneal hemorrhage Gout CAD (coronary artery disease) ESRD on dialysis (HCC) Hyperkalemia COPD (chronic obstructive pulmonary disease) (HCC) Hypertension   Hospital Course: Michelle Murillo is a 78 y.o. female with medical history significant of AAA status post repair, CHF, COPD, CAD status post CABG, type 2 diabetes, hemodialysis on Monday and Friday, GERD, hypertension, seizure disorder presented with fall, left retroperitoneal hematoma, rib fractures.  Patient noted to have rule out of bed with approximate 3 foot drop landing on her left ribs.  CT imaging with hyperdensity along the left superior renal pole concerning for renal cysts with retroperitoneal hemorrhage, acute nondisplaced left ninth rib fracture.  Patient with retroperitoneal hemorrhage.  Have been seen by vascular surgeon and underwent coil embolization of the left renal artery.  Postoperatively patient's hemoglobin stayed stable.  Also underwent dialysis by nephrologist.  Patient currently stable walking about an accident to go home.  Have been cleared by nephrologist and vascular surgeon for discharge today and will follow-up as an outpatient.  Consultants: Vascular surgery, nephrology Procedures performed: As mentioned above Disposition: Home Diet recommendation:  Cardiac diet and renal diet DISCHARGE MEDICATION: Allergies as of 07/25/2023       Reactions   Isosorbide Nitrate Nausea And Vomiting   Bupropion Nausea Only   Other reaction(s): Other (See Comments) "I didn't do well with it.".hallucinates   Morphine And Codeine Itching   Per pt  morphine only        Medication List     STOP taking these medications    amLODipine 5 MG tablet Commonly known as: NORVASC   colchicine 0.6 MG tablet   losartan 100 MG tablet Commonly known as: COZAAR   traMADol 50 MG tablet Commonly known as: Ultram   triamcinolone cream 0.1 % Commonly known as: KENALOG       TAKE these medications    albuterol 108 (90 Base) MCG/ACT inhaler Commonly known as: VENTOLIN HFA Inhale 2 puffs into the lungs every 6 (six) hours as needed for wheezing or shortness of breath.   allopurinol 300 MG tablet Commonly known as: ZYLOPRIM Take 300 mg by mouth daily.   aspirin EC 81 MG tablet Take 81 mg by mouth daily.   atorvastatin 20 MG tablet Commonly known as: LIPITOR Take 20 mg by mouth daily.   calcitRIOL 0.25 MCG capsule Commonly known as: ROCALTROL Take 0.25 mcg by mouth daily.   furosemide 40 MG tablet Commonly known as: LASIX Take 40 mg by mouth daily as needed (for fluid retention (swollen ankles)---typically no more than twice a week). Takes on T, Th, Sat, Sun only if Diastolic is 60 or above   gabapentin 100 MG capsule Commonly known as: NEURONTIN Take 100 mg by mouth 3 (three) times daily.   metoprolol tartrate 50 MG tablet Commonly known as: LOPRESSOR Take 50 mg by mouth 2 (two) times daily.   pantoprazole 40 MG tablet Commonly known as: PROTONIX Take 40 mg by mouth 2 (two) times daily.   Symbicort 160-4.5 MCG/ACT inhaler Generic drug: budesonide-formoterol Inhale 2 puffs into the lungs.        Follow-up Information     Dew, Marlow Baars, MD Follow  up in 4 week(s).   Specialties: Vascular Surgery, Radiology, Interventional Cardiology Why: With Evar Ultrasound Contact information: [address] [place] [address] [phone]         Mady Haagensen, MD Follow up.   Specialty: Nephrology Contact information: [address] [place] [address] [phone]                 Discharge Exam: Filed Weights   07/23/23 0739 07/24/23 1154 07/24/23 1614  Weight: 89.8 kg 88.6 kg 87.7 kg   Head: Normocephalic and atraumatic.     Nose: Nose normal.     Mouth/Throat:     Mouth: Mucous membranes are moist.  Eyes:     Pupils: Pupils are equal, round, and reactive to light.  Cardiovascular:     Rate and Rhythm: Normal rate and regular rhythm.  Pulmonary:     Effort: Pulmonary effort is normal.  Abdominal:     General: Bowel sounds are normal.  Musculoskeletal:        General: Normal range of motion.     Cervical back: Normal range of motion.  Skin:    General: Skin is warm.  Neurological:     General: No focal deficit present.  Psychiatric:        Mood and Affect: Mood normal.   Condition at discharge: good  The results of significant diagnostics from this hospitalization (including imaging, microbiology, ancillary and laboratory) are listed below for reference.   Imaging Studies: PERIPHERAL VASCULAR CATHETERIZATION Result Date: 07/23/2023 See surgical note for result.  CT CHEST ABDOMEN PELVIS WO CONTRAST Result Date: 07/23/2023 CLINICAL DATA:  Polytrauma, blunt Fall, tenderness left lateral chest through left lateral abdomen. Dialysis pt (still makes urine each day). EXAM: CT CHEST, ABDOMEN AND PELVIS WITHOUT CONTRAST TECHNIQUE: Multidetector CT imaging of the chest, abdomen and pelvis was performed following the standard protocol without IV contrast. RADIATION DOSE REDUCTION: This exam was performed according to the departmental dose-optimization program which includes automated exposure control, adjustment of the mA and/or kV according to patient size and/or use of iterative reconstruction technique. COMPARISON:  CT abdomen pelvis 12/05/2017, CT chest 02/10/2023 FINDINGS: CHEST: Cardiovascular: The thoracic aorta is normal in caliber. The heart is normal in size. No significant pericardial effusion. Severe atherosclerotic plaque. Four-vessel coronary  calcification. Status post coronary bypass graft. The main pulmonary artery is enlarged in caliber measuring up to 3.8 cm. Lungs/Pleura: No focal consolidation. No pulmonary nodule. No pulmonary mass. No pulmonary contusion or laceration. No pneumatocele formation. No pleural effusion. No pneumothorax. No hemothorax. Mediastinum/Nodes: No pneumomediastinum. The central airways are patent. The esophagus is unremarkable.  Tiny hiatal hernia. The thyroid is unremarkable. Limited evaluation for hilar lymphadenopathy on this noncontrast study. Stable 1.1 cm precarinal lymph node. Otherwise no mediastinal or axillary lymphadenopathy. Musculoskeletal/Chest wall No chest wall mass. Acute nondisplaced left 9th rib fracture. No sternal fracture. No spinal fracture. Intact sternotomy wires. ABDOMEN / PELVIS: Hepatobiliary: Not enlarged. No focal lesion. Gallbladder not visualized and likely surgically removed. No biliary ductal dilatation. Pancreas: Normal pancreatic contour. No main pancreatic duct dilatation. Spleen: Not enlarged. No focal lesion. Adrenals/Urinary Tract: No nodularity bilaterally. No hydroureteronephrosis. No nephroureterolithiasis. Simple fluid density lesions within the kidneys likely represent simple renal cysts. Simple renal cysts, in the absence of clinically indicated signs/symptoms, require no independent follow-up. Interval development of hyperdensity along a known left superior renal pole cyst with associated high density perinephric fat stranding. The urinary bladder is unremarkable. Stomach/Bowel: No small  or large bowel wall thickening or dilatation. Colonic diverticulosis. The appendix is not definitely identified with no inflammatory changes in the right lower quadrant to suggest acute appendicitis. Vasculature/Lymphatic: Hyperdensity along the right pelvic sidewall likely vascular coiling. Severe atherosclerotic plaque. Interval increase in size of a 10 cm (from 6 cm in 2019) right common  iliac artery aneurysm status post aorto bi-iliac stent graft repair. No abdominal, pelvic, inguinal lymphadenopathy. Reproductive: Uterus is unremarkable. Bilateral adnexa are unremarkable. Other: No simple free fluid ascites. No pneumoperitoneum. No mesenteric hematoma identified. No organized fluid collection. Musculoskeletal: No significant soft tissue hematoma. No acute pelvic fracture. No spinal fracture. Ports and Devices: Right chest wall dialysis catheter with tip terminating at the superior caval junction. IMPRESSION: 1. Interval development of hyperdensity along a known left superior renal pole cyst with associated high density perinephric fat stranding. Findings suggestive of renal cyst with associated retroperitoneal hematoma. Markedly limited evaluation on this noncontrast study. 2. Acute nondisplaced left 9th rib fracture. No associated pneumothorax. 3. No acute intrathoracic or intrapelvic traumatic injury with limited evaluation on this noncontrast study. 4. No acute fracture or traumatic malalignment of the thoracic or lumbar spine. 5. Aortic Atherosclerosis (ICD10-I70.0) including four-vessel coronary calcifications status post coronary artery bypass graft. 6. Interval increase in size of a 10 cm (from 6 cm in 2019) right common iliac artery aneurysm status post aorto bi-iliac stent graft repair. Markedly limited evaluation on this noncontrast study. Electronically Signed   By: Tish Frederickson M.D.   On: 07/23/2023 08:35   DG BONE DENSITY (DXA) Result Date: 07/01/2023 EXAM: DUAL X-RAY ABSORPTIOMETRY (DXA) FOR BONE MINERAL DENSITY IMPRESSION: Your patient Brissa Asante completed a BMD test on 07/01/2023 using the [employer] iDXA DXA System (software version: 14.10) manufactured by [employer]. The following summarizes the results of our evaluation. Technologist: [facility] PATIENT BIOGRAPHICAL: Name: Sulamita, Lafountain Patient ID: [id number] Birth Date: 1946/05/19 Height: 64.0 in. Gender: Female  Exam Date: 07/01/2023 Weight: 198.0 lbs. Indications: Advanced Age, COPD, dialysis, Postmenopausal Fractures: Foot Left Treatments: Calcium, Ventolin HFA DENSITOMETRY RESULTS: Site         Region     Measured Date Measured Age WHO Classification Young Adult T-score BMD         %Change vs. Previous Significant Change (*) DualFemur Neck Left 07/01/2022 78.7 Osteopenia -1.9 0.781 g/cm2 Left Forearm Radius 33% 07/01/2023 78.7 Osteoporosis -3.0 0.615 g/cm2 ASSESSMENT: The BMD measured at Forearm Radius 33% is 0.615 g/cm2 with a T-score of-3.0. This patient's diagnostic category is OSTEOPOROSIS according to World Health Organization [facility]) criteria. The scan quality is good. Lumbar spine was not utilized due to advanced degenerative changes. World Science writer [facility]) criteria for post-menopausal, Caucasian Women: Normal:                   T-score at or above -1 SD Osteopenia/low bone mass: T-score between -1 and -2.5 SD Osteoporosis:             T-score at or below -2.5 SD RECOMMENDATIONS: 1. All patients should optimize calcium and vitamin D intake. 2. Consider FDA-approved medical therapies in postmenopausal women and men aged 45 years and older, based on the following: a. A hip or vertebral(clinical or morphometric) fracture b. T-score < -2.5 at the femoral neck or spine after appropriate evaluation to exclude secondary causes c. Low bone mass (T-score between -1.0 and -2.5 at the femoral neck or spine) and a 10-year probability of a hip fracture > 3% or a 10-year probability  of a major osteoporosis-related fracture > 20% based on the [place]-adapted WHO algorithm 3. Clinician judgment and/or patient preferences may indicate treatment for people with 10-year fracture probabilities above or below these levels FOLLOW-UP: People with diagnosed cases of osteoporosis or at high risk for fracture should have regular bone mineral density tests. For patients eligible for Medicare, routine testing is allowed once every 2 years.  The testing frequency can be increased to one year for patients who have rapidly progressing disease, those who are receiving or discontinuing medical therapy to restore bone mass, or have additional risk factors. I have reviewed this report, and agree with the above findings. [facility] Electronically Signed   By: Harmon Pier M.D.   On: 07/01/2023 14:35    Microbiology: Results for orders placed or performed in visit on 08/20/21  CULTURE, URINE COMPREHENSIVE     Status: None   Collection Time: 08/20/21 10:21 AM   Specimen: Urine   UR  Result Value Ref Range Status   Urine Culture, Comprehensive Final report  Final   Organism ID, Bacteria Comment  Final    Comment: Mixed urogenital flora 3,000 Colonies/mL     Labs: CBC: Recent Labs  Lab 07/23/23 0747 07/23/23 1505 07/23/23 2013 07/24/23 0237 07/24/23 1238 07/25/23 0546  WBC 5.0  --   --   --  9.1 15.2*  NEUTROABS  --   --   --   --   --  13.4*  HGB 11.1* 10.7* 11.0* 10.7* 10.5* 10.6*  HCT 35.7* 34.6* 36.1 34.4* 33.7* 33.2*  MCV 100.3*  --   --   --  101.5* 97.4  PLT 164  --   --   --  144* 125*   Basic Metabolic Panel: Recent Labs  Lab 07/23/23 0747 07/24/23 0237 07/25/23 0546  NA 141 141 135  K 4.7 6.1* 4.2  CL 106 106 97*  CO2 25 23 27   GLUCOSE 104* 130* 115*  BUN 52* 61* 37*  CREATININE 4.66* 5.45* 4.33*  CALCIUM 8.7* 8.8* 8.6*   Liver Function Tests: Recent Labs  Lab 07/24/23 0237  AST 17  ALT 13  ALKPHOS 54  BILITOT 0.6  PROT 7.2  ALBUMIN 3.8   CBG: Recent Labs  Lab 07/24/23 1417 07/24/23 1730 07/24/23 2119 07/25/23 0832 07/25/23 1159  GLUCAP 89 92 100* 122* 115*    Discharge time spent:  .  Signed: Loyce Dys, MD [facility] 07/25/2023

## 2023-07-26 LAB — HEPATITIS B SURFACE ANTIBODY, QUANTITATIVE: Hep B S AB Quant (Post): 17.5 m[IU]/mL

## 2023-08-25 ENCOUNTER — Other Ambulatory Visit (INDEPENDENT_AMBULATORY_CARE_PROVIDER_SITE_OTHER): Payer: Self-pay | Admitting: Vascular Surgery

## 2023-08-25 DIAGNOSIS — N186 End stage renal disease: Secondary | ICD-10-CM

## 2023-08-26 ENCOUNTER — Ambulatory Visit (INDEPENDENT_AMBULATORY_CARE_PROVIDER_SITE_OTHER): Payer: Medicare HMO | Admitting: Vascular Surgery

## 2023-08-26 ENCOUNTER — Ambulatory Visit (INDEPENDENT_AMBULATORY_CARE_PROVIDER_SITE_OTHER): Payer: Medicare HMO

## 2023-08-26 ENCOUNTER — Encounter (INDEPENDENT_AMBULATORY_CARE_PROVIDER_SITE_OTHER): Payer: Self-pay | Admitting: Vascular Surgery

## 2023-08-26 VITALS — BP 141/73 | HR 86 | Resp 16 | Wt 192.0 lb

## 2023-08-26 DIAGNOSIS — E119 Type 2 diabetes mellitus without complications: Secondary | ICD-10-CM

## 2023-08-26 DIAGNOSIS — I7143 Infrarenal abdominal aortic aneurysm, without rupture: Secondary | ICD-10-CM

## 2023-08-26 DIAGNOSIS — I10 Essential (primary) hypertension: Secondary | ICD-10-CM | POA: Diagnosis not present

## 2023-08-26 DIAGNOSIS — N186 End stage renal disease: Secondary | ICD-10-CM

## 2023-08-26 DIAGNOSIS — Z992 Dependence on renal dialysis: Secondary | ICD-10-CM

## 2023-08-26 DIAGNOSIS — S37012A Minor contusion of left kidney, initial encounter: Secondary | ICD-10-CM | POA: Insufficient documentation

## 2023-08-26 DIAGNOSIS — S37012D Minor contusion of left kidney, subsequent encounter: Secondary | ICD-10-CM

## 2023-08-26 NOTE — Assessment & Plan Note (Signed)
 Status post left renal artery embolization for hemorrhage with good results.

## 2023-08-26 NOTE — Progress Notes (Signed)
 MRN : [id number]  Michelle Murillo is a 78 y.o. ([birth year]) female who presents with chief complaint of  Chief Complaint  Patient presents with   Follow-up    6 month HDA follow up  .  History of Present Illness: Patient returns today in follow up of multiple issues.  She had an embolization for hemorrhage from her left kidney about a month ago.  This occurred after a major fall.  She came in with a perinephric hematoma and anemia.  We performed embolization of the left kidney and she did well.  She was already on dialysis.  She had no periprocedural complications.  Also on her scan, her right iliac artery aneurysm appeared to be enlarging.  We were able to perform images and I did not see an obvious type I or III endoleak causing growth of the aneurysm. She is also seen today for her dialysis access.  Her left arm AV fistula is working well.  She has not had to use her catheter now in a couple of weeks.  Duplex was performed today showing a patent left arm AV fistula without obvious stenosis.  Current Outpatient Medications  Medication Sig Dispense Refill   albuterol (VENTOLIN HFA) 108 (90 Base) MCG/ACT inhaler Inhale 2 puffs into the lungs every 6 (six) hours as needed for wheezing or shortness of breath.     allopurinol (ZYLOPRIM) 300 MG tablet Take 300 mg by mouth daily.      aspirin EC 81 MG tablet Take 81 mg by mouth daily.     atorvastatin (LIPITOR) 20 MG tablet Take 20 mg by mouth daily.     calcitRIOL (ROCALTROL) 0.25 MCG capsule Take 0.25 mcg by mouth daily.     furosemide (LASIX) 40 MG tablet Take 40 mg by mouth daily as needed (for fluid retention (swollen ankles)---typically no more than twice a week). Takes on T, Th, Sat, Sun only if Diastolic is 60 or above     gabapentin (NEURONTIN) 100 MG capsule Take 100 mg by mouth 3 (three) times daily.     metoprolol tartrate (LOPRESSOR) 50 MG tablet Take 50 mg by mouth 2 (two) times daily.     pantoprazole (PROTONIX) 40 MG tablet  Take 40 mg by mouth 2 (two) times daily.     SYMBICORT 160-4.5 MCG/ACT inhaler Inhale 2 puffs into the lungs.     No current facility-administered medications for this visit.    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  pt was in a coma for 18 days after this.(during CABG)   COPD (chronic obstructive pulmonary disease) (HCC)    Coronary artery disease    a.) s/p 4v CABG 06/12/2011; b.) LHC 03/07/2020: 10% dRCA, 75% p-mRCA, 75% pLCx, 50% p-mLAD, 45% mLAD; SVG-RCA and SVG-OM occluded   Diabetes mellitus without complication (HCC)    borderline, does not take any medication   Dyspnea    ESRD on hemodialysis (HCC) 2019   a.) M-W-F   GERD (gastroesophageal reflux disease)    Gout    Hypertension    Legally blind    unable to see any more than shapes   Right ovarian cyst    S/P CABG x  4 06/12/2011   a.) LIMA-LAD, SVG-OM1, SVG-LCx, SVG-RCA --> procedure complicated by bradycardic arrest and respiratory failure (pulmonary edema) resulting in prolonged ICU admission. Developed rapid A.fib and required DCCV x 3 durrsing ICU admission.   Seizures (HCC) 2013   only happened during cabg procedure while on the table   Sleep apnea    a.) does not use noctural PAP therapy    Past Surgical History:  Procedure Laterality Date   A/V FISTULAGRAM Left 01/13/2023   Procedure: A/V Fistulagram;  Surgeon: Annice Needy, MD;  Location: [facility] INVASIVE CV LAB;  Service: Cardiovascular;  Laterality: Left;   ABDOMINAL AORTOGRAM N/A 06/23/2017   Procedure: ABDOMINAL AORTOGRAM;  Surgeon: Annice Needy, MD;  Location: [facility] INVASIVE CV LAB;  Service: Cardiovascular;  Laterality: N/A;   ABDOMINAL SURGERY  2017   aneurysm repair with dr. Kevin Mario   AV FISTULA PLACEMENT  Left 11/07/2022   Procedure: ARTERIOVENOUS (AV) FISTULA CREATION (BRACHIALCEPHALIC);  Surgeon: Annice Needy, MD;  Location: [facility] ORS;  Service: Vascular;  Laterality: Left;   BRONCHOSCOPY  2015   done at [facility]. carcinoid of airways   CHOLECYSTECTOMY     age 89   CORONARY ARTERY BYPASS GRAFT  06/12/2011   DIALYSIS/PERMA CATHETER INSERTION N/A 06/24/2022   Procedure: DIALYSIS/PERMA CATHETER INSERTION;  Surgeon: Annice Needy, MD;  Location: [facility] CV LAB;  Service: Cardiovascular;  Laterality: N/A;   DIALYSIS/PERMA CATHETER INSERTION N/A 09/03/2022   Procedure: DIALYSIS/PERMA CATHETER INSERTION;  Surgeon: Renford Dills, MD;  Location: [facility];  Service: Cardiovascular;  Laterality: N/A;   DIALYSIS/PERMA CATHETER INSERTION N/A 10/16/2022   Procedure: DIALYSIS/PERMA CATHETER INSERTION;  Surgeon: Renford Dills, MD;  Location: [facility];  Service: Cardiovascular;  Laterality: N/A;   DIALYSIS/PERMA CATHETER INSERTION N/A 10/03/2022   Procedure: DIALYSIS/PERMA CATHETER INSERTION;  Surgeon: Annice Needy, MD;  Location: [facility];  Service: Cardiovascular;  Laterality: N/A;   EMBOLIZATION (CATH LAB) Left 07/23/2023   Procedure: EMBOLIZATION;  Surgeon: Annice Needy, MD;  Location: [facility];  Service: Cardiovascular;  Laterality: Left;  Left Renal Embolization   ENDOVASCULAR REPAIR/STENT GRAFT N/A 07/06/2018   Procedure: ENDOVASCULAR REPAIR/STENT GRAFT;  Surgeon: Annice Needy, MD;  Location: [facility];  Service: Cardiovascular;  Laterality: N/A;   laryngeal nodule surgery     LEFT HEART CATH AND CORS/GRAFTS ANGIOGRAPHY Left 03/07/2020   Procedure: LEFT HEART CATH AND CORS/GRAFTS ANGIOGRAPHY;  Surgeon: Lamar Blinks, MD;  Location: [facility] INV[facility] CV LAB;  Service: Cardiovascular;  Laterality: Left;   OOPHORECTOMY Right      Social History   Tobacco Use   Smoking status: Every Day    Current packs/day: 0.50    Types:  Cigarettes   Smokeless tobacco: Never   Tobacco comments:    6-7 cigs per day  Vaping Use   Vaping status: Never Used  Substance Use Topics   Alcohol use: No   Drug use: No       Family History  Problem Relation Age of Onset   Congestive Heart Failure Mother    Heart attack Mother    Cancer Father    Liver cancer Father    Cancer Paternal Grandfather      Allergies  Allergen Reactions   Isosorbide Nitrate Nausea And Vomiting   Bupropion Nausea Only    Other reaction(s): Other (See Comments) "I didn't do well with it.".hallucinates   Morphine And Codeine Itching  Per pt morphine only      REVIEW OF SYSTEMS (Negative unless checked)   Constitutional: [] Weight loss  [] Fever  [] Chills Cardiac: [] Chest pain   [] Chest pressure   [] Palpitations   [] Shortness of breath when laying flat   [] Shortness of breath at rest   [x] Shortness of breath with exertion. Vascular:  [] Pain in legs with walking   [] Pain in legs at rest   [] Pain in legs when laying flat   [] Claudication   [] Pain in feet when walking  [] Pain in feet at rest  [] Pain in feet when laying flat   [] History of DVT   [] Phlebitis   [] Swelling in legs   [] Varicose veins   [] Non-healing ulcers Pulmonary:   [] Uses home oxygen   [] Productive cough   [] Hemoptysis   [] Wheeze  [x] COPD   [] Asthma Neurologic:  [] Dizziness  [] Blackouts   [] Seizures   [] History of stroke   [] History of TIA  [] Aphasia   [] Temporary blindness   [] Dysphagia   [] Weakness or numbness in arms   [] Weakness or numbness in legs Musculoskeletal:  [x] Arthritis   [] Joint swelling   [x] Joint pain   [] Low back pain Hematologic:  [] Easy bruising  [] Easy bleeding   [] Hypercoagulable state   [x] Anemic  [] Hepatitis Gastrointestinal:  [] Blood in stool   [] Vomiting blood  [] Gastroesophageal reflux/heartburn   [] Abdominal pain Genitourinary:  [x] Chronic kidney disease   [] Difficult urination  [] Frequent urination  [] Burning with urination   [] Hematuria Skin:  [] Rashes    [] Ulcers   [] Wounds Psychological:  [] History of anxiety   []  History of major depression.  Physical Examination  BP (!) 141/73   Pulse 86   Resp 16   Wt 192 lb (87.1 kg)   BMI 32.96 kg/m  Gen:  WD/WN, NAD Head: [place]/AT, No temporalis wasting. Ear/Nose/Throat: Hearing grossly intact, nares w/o erythema or drainage Eyes: Conjunctiva clear. Sclera non-icteric Neck: Supple.  Trachea midline Pulmonary:  Good air movement, no use of accessory muscles.  Cardiac: RRR, no JVD Vascular: good thrill in left arm AVF Vessel Right Left  Radial Palpable Palpable                                   Gastrointestinal: soft, non-tender/non-distended. No guarding/reflex.  Musculoskeletal: M/S 5/5 throughout.  No deformity or atrophy.  She does have a hematoma present in the right medial lower leg from her fall with a small scab but almost completely healed at this point.  Trace lower extremity edema. Neurologic: Sensation grossly intact in extremities.  Symmetrical.  Speech is fluent.  Psychiatric: Judgment intact, Mood & affect appropriate for pt's clinical situation. Dermatologic: No rashes or ulcers noted.  No cellulitis or open wounds.      Labs Recent Results (from the past 2160 hours)  CBC     Status: Abnormal   Collection Time: 07/23/23  7:47 AM  Result Value Ref Range   WBC 5.0 4.0 - 10.5 K/uL   RBC 3.56 (L) 3.87 - 5.11 MIL/uL   Hemoglobin 11.1 (L) 12.0 - 15.0 g/dL   HCT 78.2 (L) 95.6 - 21.3 %   MCV 100.3 (H) 80.0 - 100.0 fL   MCH 31.2 26.0 - 34.0 pg   MCHC 31.1 30.0 - 36.0 g/dL   RDW 08.6 57.8 - 46.9 %   Platelets 164 150 - 400 K/uL   nRBC 0.0 0.0 - 0.2 %    Comment: Performed at [facility], [address], [place], [place] [address]  Basic metabolic panel     Status: Abnormal   Collection Time: 07/23/23  7:47 AM  Result Value Ref Range   Sodium 141 135 - 145 mmol/L   Potassium 4.7 3.5 - 5.1 mmol/L   Chloride 106 98 - 111 mmol/L   CO2 25 22 - 32 mmol/L    Glucose, Bld 104 (H) 70 - 99 mg/dL    Comment: Glucose reference range applies only to samples taken after fasting for at least 8 hours.   BUN 52 (H) 8 - 23 mg/dL   Creatinine, Ser 6.04 (H) 0.44 - 1.00 mg/dL   Calcium 8.7 (L) 8.9 - 10.3 mg/dL   GFR, Estimated 9 (L) >60 mL/min    Comment: (NOTE) Calculated using the CKD-EPI Creatinine Equation (2021)    Anion gap 10 5 - 15    Comment: Performed at [facility], [address] [place], [place] [address]  Hemoglobin and hematocrit, blood     Status: Abnormal   Collection Time: 07/23/23  3:05 PM  Result Value Ref Range   Hemoglobin 10.7 (L) 12.0 - 15.0 g/dL   HCT 11.9 (L) 14.7 - 82.9 %    Comment: Performed at [facility], [address] [place], [place] [address]  Glucose, capillary     Status: Abnormal   Collection Time: 07/23/23  5:15 PM  Result Value Ref Range   Glucose-Capillary 121 (H) 70 - 99 mg/dL    Comment: Glucose reference range applies only to samples taken after fasting for at least 8 hours.   Comment 1 Notify RN   Hemoglobin A1c     Status: Abnormal   Collection Time: 07/23/23  5:54 PM  Result Value Ref Range   Hgb A1c MFr Bld 5.9 (H) 4.8 - 5.6 %    Comment: (NOTE) Pre diabetes:          5.7%-6.4%  Diabetes:              >6.4%  Glycemic control for   <7.0% adults with diabetes    Mean Plasma Glucose 122.63 mg/dL    Comment: Performed at [facility], [address] [place], [place] [address]  Hemoglobin and hematocrit, blood     Status: Abnormal   Collection Time: 07/23/23  8:13 PM  Result Value Ref Range   Hemoglobin 11.0 (L) 12.0 - 15.0 g/dL   HCT 84.6 96.2 - 95.2 %    Comment: Performed at [facility], [address] [place], [place] [address]  Glucose, capillary     Status: Abnormal   Collection Time: 07/23/23  8:31 PM  Result Value Ref Range   Glucose-Capillary 128 (H) 70 - 99 mg/dL    Comment: Glucose reference range applies only to samples taken after fasting  for at least 8 hours.  Hemoglobin and hematocrit, blood     Status: Abnormal   Collection Time: 07/24/23  2:37 AM  Result Value Ref Range   Hemoglobin 10.7 (L) 12.0 - 15.0 g/dL   HCT 44.0 (L) 10.2 - 72.5 %    Comment: Performed at [facility], [address] [place], [place] [address]  Comprehensive metabolic panel     Status: Abnormal   Collection Time: 07/24/23  2:37 AM  Result Value Ref Range   Sodium 141 135 - 145 mmol/L   Potassium 6.1 (H) 3.5 - 5.1 mmol/L   Chloride 106 98 - 111 mmol/L  CO2 23 22 - 32 mmol/L   Glucose, Bld 130 (H) 70 - 99 mg/dL    Comment: Glucose reference range applies only to samples taken after fasting for at least 8 hours.   BUN 61 (H) 8 - 23 mg/dL   Creatinine, Ser 1.61 (H) 0.44 - 1.00 mg/dL   Calcium 8.8 (L) 8.9 - 10.3 mg/dL   Total Protein 7.2 6.5 - 8.1 g/dL   Albumin 3.8 3.5 - 5.0 g/dL   AST 17 15 - 41 U/L   ALT 13 0 - 44 U/L   Alkaline Phosphatase 54 38 - 126 U/L   Total Bilirubin 0.6 0.0 - 1.2 mg/dL   GFR, Estimated 8 (L) >60 mL/min    Comment: (NOTE) Calculated using the CKD-EPI Creatinine Equation (2021)    Anion gap 12 5 - 15    Comment: Performed at [facility], [address] [place], [place] [address]  Glucose, capillary     Status: Abnormal   Collection Time: 07/24/23  7:27 AM  Result Value Ref Range   Glucose-Capillary 103 (H) 70 - 99 mg/dL    Comment: Glucose reference range applies only to samples taken after fasting for at least 8 hours.   Comment 1 Notify RN    Comment 2 Document in Chart   Glucose, capillary     Status: None   Collection Time: 07/24/23 11:56 AM  Result Value Ref Range   Glucose-Capillary 98 70 - 99 mg/dL    Comment: Glucose reference range applies only to samples taken after fasting for at least 8 hours.  Hepatitis B surface antigen     Status: None   Collection Time: 07/24/23 12:37 PM  Result Value Ref Range   Hepatitis B Surface Ag NON REACTIVE NON REACTIVE    Comment: Performed  at [facility], [address], [place], [place] [address]  Hepatitis B surface antibody,quantitative     Status: None   Collection Time: 07/24/23 12:38 PM  Result Value Ref Range   Hep B S AB Quant (Post) 17.5 Immunity>10 mIU/mL    Comment: (NOTE)  Status of Immunity                     Anti-HBs Level  ------------------                     -------------- Inconsistent with Immunity                  0.0 - 10.0 Consistent with Immunity                         >10.0 Performed At: [facility] [address] [place], [place] [id number] Jolene Schimke MD FA:[id number]   CBC     Status: Abnormal   Collection Time: 07/24/23 12:38 PM  Result Value Ref Range   WBC 9.1 4.0 - 10.5 K/uL   RBC 3.32 (L) 3.87 - 5.11 MIL/uL   Hemoglobin 10.5 (L) 12.0 - 15.0 g/dL   HCT 69.6 (L) 29.5 - 28.4 %   MCV 101.5 (H) 80.0 - 100.0 fL   MCH 31.6 26.0 - 34.0 pg   MCHC 31.2 30.0 - 36.0 g/dL   RDW 13.2 44.0 - 10.2 %   Platelets 144 (L) 150 - 400 K/uL   nRBC 0.0 0.0 - 0.2 %    Comment: Performed at [facility], [address] [place], [place] [address]  Glucose, capillary     Status: None   Collection Time: 07/24/23  2:17 PM  Result Value Ref Range   Glucose-Capillary 89 70 - 99 mg/dL    Comment: Glucose reference range applies only to samples taken after fasting for at least 8 hours.  Glucose, capillary     Status: None   Collection Time: 07/24/23  5:30 PM  Result Value Ref Range   Glucose-Capillary 92 70 - 99 mg/dL    Comment: Glucose reference range applies only to samples taken after fasting for at least 8 hours.  Glucose, capillary     Status: Abnormal   Collection Time: 07/24/23  9:19 PM  Result Value Ref Range   Glucose-Capillary 100 (H) 70 - 99 mg/dL    Comment: Glucose reference range applies only to samples taken after fasting for at least 8 hours.  CBC with Differential/Platelet     Status: Abnormal   Collection Time: 07/25/23  5:46 AM  Result Value Ref Range   WBC  15.2 (H) 4.0 - 10.5 K/uL   RBC 3.41 (L) 3.87 - 5.11 MIL/uL   Hemoglobin 10.6 (L) 12.0 - 15.0 g/dL   HCT 46.9 (L) 62.9 - 52.8 %   MCV 97.4 80.0 - 100.0 fL   MCH 31.1 26.0 - 34.0 pg   MCHC 31.9 30.0 - 36.0 g/dL   RDW 41.3 24.4 - 01.0 %   Platelets 125 (L) 150 - 400 K/uL   nRBC 0.0 0.0 - 0.2 %   Neutrophils Relative % 89 %   Neutro Abs 13.4 (H) 1.7 - 7.7 K/uL   Lymphocytes Relative 4 %   Lymphs Abs 0.7 0.7 - 4.0 K/uL   Monocytes Relative 6 %   Monocytes Absolute 0.9 0.1 - 1.0 K/uL   Eosinophils Relative 0 %   Eosinophils Absolute 0.0 0.0 - 0.5 K/uL   Basophils Relative 0 %   Basophils Absolute 0.0 0.0 - 0.1 K/uL   Immature Granulocytes 1 %   Abs Immature Granulocytes 0.18 (H) 0.00 - 0.07 K/uL    Comment: Performed at [facility], [address] [place], [place] [address]  Basic metabolic panel     Status: Abnormal   Collection Time: 07/25/23  5:46 AM  Result Value Ref Range   Sodium 135 135 - 145 mmol/L   Potassium 4.2 3.5 - 5.1 mmol/L   Chloride 97 (L) 98 - 111 mmol/L   CO2 27 22 - 32 mmol/L   Glucose, Bld 115 (H) 70 - 99 mg/dL    Comment: Glucose reference range applies only to samples taken after fasting for at least 8 hours.   BUN 37 (H) 8 - 23 mg/dL   Creatinine, Ser 6.64 (H) 0.44 - 1.00 mg/dL   Calcium 8.6 (L) 8.9 - 10.3 mg/dL   GFR, Estimated 10 (L) >60 mL/min    Comment: (NOTE) Calculated using the CKD-EPI Creatinine Equation (2021)    Anion gap 11 5 - 15    Comment: Performed at [facility], [address] [place], [place] [address]  Glucose, capillary     Status: Abnormal   Collection Time: 07/25/23  8:32 AM  Result Value Ref Range   Glucose-Capillary 122 (H) 70 - 99 mg/dL    Comment: Glucose reference range applies only to samples taken after fasting for at least 8 hours.  Glucose, capillary     Status: Abnormal   Collection Time: 07/25/23 11:59 AM  Result Value Ref Range   Glucose-Capillary 115 (H)  70 - 99 mg/dL    Comment: Glucose  reference range applies only to samples taken after fasting for at least 8 hours.    Radiology No results found.  Assessment/Plan  ESRD on dialysis [facility]) Duplex shows a patent access that clinically is working well.  We can now take out her PermCath.  Continue to use the AV fistula.  Recheck with duplex in 6 months.  AAA (abdominal aortic aneurysm) without rupture (HCC) Aneurysm sac seemed enlarged on her CT scan but no obvious signs of type I or III endoleak was present.  When she follows up with her next dialysis access duplex, we will perform an EVAR duplex as well.  Renal hematoma, left Status post left renal artery embolization for hemorrhage with good results.  Diabetes mellitus without complication (HCC) blood glucose control important in reducing the progression of atherosclerotic disease. Also, involved in wound healing. On appropriate medications.     Hypertension blood pressure control important in reducing the progression of atherosclerotic disease. On appropriate oral medications.   Festus Barren, MD  08/26/2023 3:43 PM    This note was created with Dragon medical transcription system.  Any errors from dictation are purely unintentional

## 2023-08-26 NOTE — H&P (View-Only) (Signed)
 MRN : [id number]  Michelle Murillo is a 78 y.o. ([birth year]) female who presents with chief complaint of  Chief Complaint  Patient presents with   Follow-up    6 month HDA follow up  .  History of Present Illness: Patient returns today in follow up of multiple issues.  She had an embolization for hemorrhage from her left kidney about a month ago.  This occurred after a major fall.  She came in with a perinephric hematoma and anemia.  We performed embolization of the left kidney and she did well.  She was already on dialysis.  She had no periprocedural complications.  Also on her scan, her right iliac artery aneurysm appeared to be enlarging.  We were able to perform images and I did not see an obvious type I or III endoleak causing growth of the aneurysm. She is also seen today for her dialysis access.  Her left arm AV fistula is working well.  She has not had to use her catheter now in a couple of weeks.  Duplex was performed today showing a patent left arm AV fistula without obvious stenosis.  Current Outpatient Medications  Medication Sig Dispense Refill   albuterol (VENTOLIN HFA) 108 (90 Base) MCG/ACT inhaler Inhale 2 puffs into the lungs every 6 (six) hours as needed for wheezing or shortness of breath.     allopurinol (ZYLOPRIM) 300 MG tablet Take 300 mg by mouth daily.      aspirin EC 81 MG tablet Take 81 mg by mouth daily.     atorvastatin (LIPITOR) 20 MG tablet Take 20 mg by mouth daily.     calcitRIOL (ROCALTROL) 0.25 MCG capsule Take 0.25 mcg by mouth daily.     furosemide (LASIX) 40 MG tablet Take 40 mg by mouth daily as needed (for fluid retention (swollen ankles)---typically no more than twice a week). Takes on T, Th, Sat, Sun only if Diastolic is 60 or above     gabapentin (NEURONTIN) 100 MG capsule Take 100 mg by mouth 3 (three) times daily.     metoprolol tartrate (LOPRESSOR) 50 MG tablet Take 50 mg by mouth 2 (two) times daily.     pantoprazole (PROTONIX) 40 MG tablet  Take 40 mg by mouth 2 (two) times daily.     SYMBICORT 160-4.5 MCG/ACT inhaler Inhale 2 puffs into the lungs.     No current facility-administered medications for this visit.    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  pt was in a coma for 18 days after this.(during CABG)   COPD (chronic obstructive pulmonary disease) (HCC)    Coronary artery disease    a.) s/p 4v CABG 06/12/2011; b.) LHC 03/07/2020: 10% dRCA, 75% p-mRCA, 75% pLCx, 50% p-mLAD, 45% mLAD; SVG-RCA and SVG-OM occluded   Diabetes mellitus without complication (HCC)    borderline, does not take any medication   Dyspnea    ESRD on hemodialysis (HCC) 2019   a.) M-W-F   GERD (gastroesophageal reflux disease)    Gout    Hypertension    Legally blind    unable to see any more than shapes   Right ovarian cyst    S/P CABG x  4 06/12/2011   a.) LIMA-LAD, SVG-OM1, SVG-LCx, SVG-RCA --> procedure complicated by bradycardic arrest and respiratory failure (pulmonary edema) resulting in prolonged ICU admission. Developed rapid A.fib and required DCCV x 3 durrsing ICU admission.   Seizures (HCC) 2013   only happened during cabg procedure while on the table   Sleep apnea    a.) does not use noctural PAP therapy    Past Surgical History:  Procedure Laterality Date   A/V FISTULAGRAM Left 01/13/2023   Procedure: A/V Fistulagram;  Surgeon: Annice Needy, MD;  Location: [facility] INVASIVE CV LAB;  Service: Cardiovascular;  Laterality: Left;   ABDOMINAL AORTOGRAM N/A 06/23/2017   Procedure: ABDOMINAL AORTOGRAM;  Surgeon: Annice Needy, MD;  Location: [facility] INVASIVE CV LAB;  Service: Cardiovascular;  Laterality: N/A;   ABDOMINAL SURGERY  2017   aneurysm repair with dr. Kevin Mario   AV FISTULA PLACEMENT  Left 11/07/2022   Procedure: ARTERIOVENOUS (AV) FISTULA CREATION (BRACHIALCEPHALIC);  Surgeon: Annice Needy, MD;  Location: [facility] ORS;  Service: Vascular;  Laterality: Left;   BRONCHOSCOPY  2015   done at [facility]. carcinoid of airways   CHOLECYSTECTOMY     age 89   CORONARY ARTERY BYPASS GRAFT  06/12/2011   DIALYSIS/PERMA CATHETER INSERTION N/A 06/24/2022   Procedure: DIALYSIS/PERMA CATHETER INSERTION;  Surgeon: Annice Needy, MD;  Location: [facility] INVASIVE CV LAB;  Service: Cardiovascular;  Laterality: N/A;   DIALYSIS/PERMA CATHETER INSERTION N/A 09/03/2022   Procedure: DIALYSIS/PERMA CATHETER INSERTION;  Surgeon: Renford Dills, MD;  Location: [facility] INVASIVE CV LAB;  Service: Cardiovascular;  Laterality: N/A;   DIALYSIS/PERMA CATHETER INSERTION N/A 10/16/2022   Procedure: DIALYSIS/PERMA CATHETER INSERTION;  Surgeon: Renford Dills, MD;  Location: [facility] INVAS[facility] CV LAB;  Service: Cardiovascular;  Laterality: N/A;   DIALYSIS/PERMA CATHETER INSERTION N/A 10/03/2022   Procedure: DIALYSIS/PERMA CATHETER INSERTION;  Surgeon: Annice Needy, MD;  Location: [facility]VAS[facility] CV LAB;  Service: Cardiovascular;  Laterality: N/A;   EMBOLIZATION (CATH LAB) Left 07/23/2023   Procedure: EMBOLIZATION;  Surgeon: Annice Needy, MD;  Location: [facility]VAS[facility] CV LAB;  Service: Cardiovascular;  Laterality: Left;  Left Renal Embolization   ENDOVASCULAR REPAIR/STENT GRAFT N/A 07/06/2018   Procedure: ENDOVASCULAR REPAIR/STENT GRAFT;  Surgeon: Annice Needy, MD;  Location: [facility] INVAS[facility] CV LAB;  Service: Cardiovascular;  Laterality: N/A;   laryngeal nodule surgery     LEFT HEART CATH AND CORS/GRAFTS ANGIOGRAPHY Left 03/07/2020   Procedure: LEFT HEART CATH AND CORS/GRAFTS ANGIOGRAPHY;  Surgeon: Lamar Blinks, MD;  Location: [facility] INVASIVE CV LAB;  Service: Cardiovascular;  Laterality: Left;   OOPHORECTOMY Right      Social History   Tobacco Use   Smoking status: Every Day    Current packs/day: 0.50    Types:  Cigarettes   Smokeless tobacco: Never   Tobacco comments:    6-7 cigs per day  Vaping Use   Vaping status: Never Used  Substance Use Topics   Alcohol use: No   Drug use: No       Family History  Problem Relation Age of Onset   Congestive Heart Failure Mother    Heart attack Mother    Cancer Father    Liver cancer Father    Cancer Paternal Grandfather      Allergies  Allergen Reactions   Isosorbide Nitrate Nausea And Vomiting   Bupropion Nausea Only    Other reaction(s): Other (See Comments) "I didn't do well with it.".hallucinates   Morphine And Codeine Itching  Per pt morphine only      REVIEW OF SYSTEMS (Negative unless checked)   Constitutional: [] Weight loss  [] Fever  [] Chills Cardiac: [] Chest pain   [] Chest pressure   [] Palpitations   [] Shortness of breath when laying flat   [] Shortness of breath at rest   [x] Shortness of breath with exertion. Vascular:  [] Pain in legs with walking   [] Pain in legs at rest   [] Pain in legs when laying flat   [] Claudication   [] Pain in feet when walking  [] Pain in feet at rest  [] Pain in feet when laying flat   [] History of DVT   [] Phlebitis   [] Swelling in legs   [] Varicose veins   [] Non-healing ulcers Pulmonary:   [] Uses home oxygen   [] Productive cough   [] Hemoptysis   [] Wheeze  [x] COPD   [] Asthma Neurologic:  [] Dizziness  [] Blackouts   [] Seizures   [] History of stroke   [] History of TIA  [] Aphasia   [] Temporary blindness   [] Dysphagia   [] Weakness or numbness in arms   [] Weakness or numbness in legs Musculoskeletal:  [x] Arthritis   [] Joint swelling   [x] Joint pain   [] Low back pain Hematologic:  [] Easy bruising  [] Easy bleeding   [] Hypercoagulable state   [x] Anemic  [] Hepatitis Gastrointestinal:  [] Blood in stool   [] Vomiting blood  [] Gastroesophageal reflux/heartburn   [] Abdominal pain Genitourinary:  [x] Chronic kidney disease   [] Difficult urination  [] Frequent urination  [] Burning with urination   [] Hematuria Skin:  [] Rashes    [] Ulcers   [] Wounds Psychological:  [] History of anxiety   []  History of major depression.  Physical Examination  BP (!) 141/73   Pulse 86   Resp 16   Wt 192 lb (87.1 kg)   BMI 32.96 kg/m  Gen:  WD/WN, NAD Head: [place]/AT, No temporalis wasting. Ear/Nose/Throat: Hearing grossly intact, nares w/o erythema or drainage Eyes: Conjunctiva clear. Sclera non-icteric Neck: Supple.  Trachea midline Pulmonary:  Good air movement, no use of accessory muscles.  Cardiac: RRR, no JVD Vascular: good thrill in left arm AVF Vessel Right Left  Radial Palpable Palpable                                   Gastrointestinal: soft, non-tender/non-distended. No guarding/reflex.  Musculoskeletal: M/S 5/5 throughout.  No deformity or atrophy.  She does have a hematoma present in the right medial lower leg from her fall with a small scab but almost completely healed at this point.  Trace lower extremity edema. Neurologic: Sensation grossly intact in extremities.  Symmetrical.  Speech is fluent.  Psychiatric: Judgment intact, Mood & affect appropriate for pt's clinical situation. Dermatologic: No rashes or ulcers noted.  No cellulitis or open wounds.      Labs Recent Results (from the past 2160 hours)  CBC     Status: Abnormal   Collection Time: 07/23/23  7:47 AM  Result Value Ref Range   WBC 5.0 4.0 - 10.5 K/uL   RBC 3.56 (L) 3.87 - 5.11 MIL/uL   Hemoglobin 11.1 (L) 12.0 - 15.0 g/dL   HCT 78.2 (L) 95.6 - 21.3 %   MCV 100.3 (H) 80.0 - 100.0 fL   MCH 31.2 26.0 - 34.0 pg   MCHC 31.1 30.0 - 36.0 g/dL   RDW 08.6 57.8 - 46.9 %   Platelets 164 150 - 400 K/uL   nRBC 0.0 0.0 - 0.2 %    Comment: Performed at [facility], [address], [place], [place] [address]  Basic metabolic panel     Status: Abnormal   Collection Time: 07/23/23  7:47 AM  Result Value Ref Range   Sodium 141 135 - 145 mmol/L   Potassium 4.7 3.5 - 5.1 mmol/L   Chloride 106 98 - 111 mmol/L   CO2 25 22 - 32 mmol/L    Glucose, Bld 104 (H) 70 - 99 mg/dL    Comment: Glucose reference range applies only to samples taken after fasting for at least 8 hours.   BUN 52 (H) 8 - 23 mg/dL   Creatinine, Ser 6.04 (H) 0.44 - 1.00 mg/dL   Calcium 8.7 (L) 8.9 - 10.3 mg/dL   GFR, Estimated 9 (L) >60 mL/min    Comment: (NOTE) Calculated using the CKD-EPI Creatinine Equation (2021)    Anion gap 10 5 - 15    Comment: Performed at [facility], [address] [place], [place] [address]  Hemoglobin and hematocrit, blood     Status: Abnormal   Collection Time: 07/23/23  3:05 PM  Result Value Ref Range   Hemoglobin 10.7 (L) 12.0 - 15.0 g/dL   HCT 11.9 (L) 14.7 - 82.9 %    Comment: Performed at [facility], [address] [place], [place] [address]  Glucose, capillary     Status: Abnormal   Collection Time: 07/23/23  5:15 PM  Result Value Ref Range   Glucose-Capillary 121 (H) 70 - 99 mg/dL    Comment: Glucose reference range applies only to samples taken after fasting for at least 8 hours.   Comment 1 Notify RN   Hemoglobin A1c     Status: Abnormal   Collection Time: 07/23/23  5:54 PM  Result Value Ref Range   Hgb A1c MFr Bld 5.9 (H) 4.8 - 5.6 %    Comment: (NOTE) Pre diabetes:          5.7%-6.4%  Diabetes:              >6.4%  Glycemic control for   <7.0% adults with diabetes    Mean Plasma Glucose 122.63 mg/dL    Comment: Performed at [facility], [address] [place], [place] [address]  Hemoglobin and hematocrit, blood     Status: Abnormal   Collection Time: 07/23/23  8:13 PM  Result Value Ref Range   Hemoglobin 11.0 (L) 12.0 - 15.0 g/dL   HCT 84.6 96.2 - 95.2 %    Comment: Performed at [facility], [address] [place], [place] [address]  Glucose, capillary     Status: Abnormal   Collection Time: 07/23/23  8:31 PM  Result Value Ref Range   Glucose-Capillary 128 (H) 70 - 99 mg/dL    Comment: Glucose reference range applies only to samples taken after fasting  for at least 8 hours.  Hemoglobin and hematocrit, blood     Status: Abnormal   Collection Time: 07/24/23  2:37 AM  Result Value Ref Range   Hemoglobin 10.7 (L) 12.0 - 15.0 g/dL   HCT 44.0 (L) 10.2 - 72.5 %    Comment: Performed at [facility], [address] [place], [place] [address]  Comprehensive metabolic panel     Status: Abnormal   Collection Time: 07/24/23  2:37 AM  Result Value Ref Range   Sodium 141 135 - 145 mmol/L   Potassium 6.1 (H) 3.5 - 5.1 mmol/L   Chloride 106 98 - 111 mmol/L  CO2 23 22 - 32 mmol/L   Glucose, Bld 130 (H) 70 - 99 mg/dL    Comment: Glucose reference range applies only to samples taken after fasting for at least 8 hours.   BUN 61 (H) 8 - 23 mg/dL   Creatinine, Ser 1.61 (H) 0.44 - 1.00 mg/dL   Calcium 8.8 (L) 8.9 - 10.3 mg/dL   Total Protein 7.2 6.5 - 8.1 g/dL   Albumin 3.8 3.5 - 5.0 g/dL   AST 17 15 - 41 U/L   ALT 13 0 - 44 U/L   Alkaline Phosphatase 54 38 - 126 U/L   Total Bilirubin 0.6 0.0 - 1.2 mg/dL   GFR, Estimated 8 (L) >60 mL/min    Comment: (NOTE) Calculated using the CKD-EPI Creatinine Equation (2021)    Anion gap 12 5 - 15    Comment: Performed at [facility], [address] [place], [place] [address]  Glucose, capillary     Status: Abnormal   Collection Time: 07/24/23  7:27 AM  Result Value Ref Range   Glucose-Capillary 103 (H) 70 - 99 mg/dL    Comment: Glucose reference range applies only to samples taken after fasting for at least 8 hours.   Comment 1 Notify RN    Comment 2 Document in Chart   Glucose, capillary     Status: None   Collection Time: 07/24/23 11:56 AM  Result Value Ref Range   Glucose-Capillary 98 70 - 99 mg/dL    Comment: Glucose reference range applies only to samples taken after fasting for at least 8 hours.  Hepatitis B surface antigen     Status: None   Collection Time: 07/24/23 12:37 PM  Result Value Ref Range   Hepatitis B Surface Ag NON REACTIVE NON REACTIVE    Comment: Performed  at [facility], [address], [place], [place] [address]  Hepatitis B surface antibody,quantitative     Status: None   Collection Time: 07/24/23 12:38 PM  Result Value Ref Range   Hep B S AB Quant (Post) 17.5 Immunity>10 mIU/mL    Comment: (NOTE)  Status of Immunity                     Anti-HBs Level  ------------------                     -------------- Inconsistent with Immunity                  0.0 - 10.0 Consistent with Immunity                         >10.0 Performed At: [facility] [address] [place], [place] [id number] Jolene Schimke MD FA:[id number]   CBC     Status: Abnormal   Collection Time: 07/24/23 12:38 PM  Result Value Ref Range   WBC 9.1 4.0 - 10.5 K/uL   RBC 3.32 (L) 3.87 - 5.11 MIL/uL   Hemoglobin 10.5 (L) 12.0 - 15.0 g/dL   HCT 69.6 (L) 29.5 - 28.4 %   MCV 101.5 (H) 80.0 - 100.0 fL   MCH 31.6 26.0 - 34.0 pg   MCHC 31.2 30.0 - 36.0 g/dL   RDW 13.2 44.0 - 10.2 %   Platelets 144 (L) 150 - 400 K/uL   nRBC 0.0 0.0 - 0.2 %    Comment: Performed at [facility], [address] [place], [place] [address]  Glucose, capillary     Status: None   Collection Time: 07/24/23  2:17 PM  Result Value Ref Range   Glucose-Capillary 89 70 - 99 mg/dL    Comment: Glucose reference range applies only to samples taken after fasting for at least 8 hours.  Glucose, capillary     Status: None   Collection Time: 07/24/23  5:30 PM  Result Value Ref Range   Glucose-Capillary 92 70 - 99 mg/dL    Comment: Glucose reference range applies only to samples taken after fasting for at least 8 hours.  Glucose, capillary     Status: Abnormal   Collection Time: 07/24/23  9:19 PM  Result Value Ref Range   Glucose-Capillary 100 (H) 70 - 99 mg/dL    Comment: Glucose reference range applies only to samples taken after fasting for at least 8 hours.  CBC with Differential/Platelet     Status: Abnormal   Collection Time: 07/25/23  5:46 AM  Result Value Ref Range   WBC  15.2 (H) 4.0 - 10.5 K/uL   RBC 3.41 (L) 3.87 - 5.11 MIL/uL   Hemoglobin 10.6 (L) 12.0 - 15.0 g/dL   HCT 46.9 (L) 62.9 - 52.8 %   MCV 97.4 80.0 - 100.0 fL   MCH 31.1 26.0 - 34.0 pg   MCHC 31.9 30.0 - 36.0 g/dL   RDW 41.3 24.4 - 01.0 %   Platelets 125 (L) 150 - 400 K/uL   nRBC 0.0 0.0 - 0.2 %   Neutrophils Relative % 89 %   Neutro Abs 13.4 (H) 1.7 - 7.7 K/uL   Lymphocytes Relative 4 %   Lymphs Abs 0.7 0.7 - 4.0 K/uL   Monocytes Relative 6 %   Monocytes Absolute 0.9 0.1 - 1.0 K/uL   Eosinophils Relative 0 %   Eosinophils Absolute 0.0 0.0 - 0.5 K/uL   Basophils Relative 0 %   Basophils Absolute 0.0 0.0 - 0.1 K/uL   Immature Granulocytes 1 %   Abs Immature Granulocytes 0.18 (H) 0.00 - 0.07 K/uL    Comment: Performed at [facility], [address] [place], [place] [address]  Basic metabolic panel     Status: Abnormal   Collection Time: 07/25/23  5:46 AM  Result Value Ref Range   Sodium 135 135 - 145 mmol/L   Potassium 4.2 3.5 - 5.1 mmol/L   Chloride 97 (L) 98 - 111 mmol/L   CO2 27 22 - 32 mmol/L   Glucose, Bld 115 (H) 70 - 99 mg/dL    Comment: Glucose reference range applies only to samples taken after fasting for at least 8 hours.   BUN 37 (H) 8 - 23 mg/dL   Creatinine, Ser 6.64 (H) 0.44 - 1.00 mg/dL   Calcium 8.6 (L) 8.9 - 10.3 mg/dL   GFR, Estimated 10 (L) >60 mL/min    Comment: (NOTE) Calculated using the CKD-EPI Creatinine Equation (2021)    Anion gap 11 5 - 15    Comment: Performed at [facility], [address] [place], [place] [address]  Glucose, capillary     Status: Abnormal   Collection Time: 07/25/23  8:32 AM  Result Value Ref Range   Glucose-Capillary 122 (H) 70 - 99 mg/dL    Comment: Glucose reference range applies only to samples taken after fasting for at least 8 hours.  Glucose, capillary     Status: Abnormal   Collection Time: 07/25/23 11:59 AM  Result Value Ref Range   Glucose-Capillary 115 (H)  70 - 99 mg/dL    Comment: Glucose  reference range applies only to samples taken after fasting for at least 8 hours.    Radiology No results found.  Assessment/Plan  ESRD on dialysis [facility]) Duplex shows a patent access that clinically is working well.  We can now take out her PermCath.  Continue to use the AV fistula.  Recheck with duplex in 6 months.  AAA (abdominal aortic aneurysm) without rupture (HCC) Aneurysm sac seemed enlarged on her CT scan but no obvious signs of type I or III endoleak was present.  When she follows up with her next dialysis access duplex, we will perform an EVAR duplex as well.  Renal hematoma, left Status post left renal artery embolization for hemorrhage with good results.  Diabetes mellitus without complication (HCC) blood glucose control important in reducing the progression of atherosclerotic disease. Also, involved in wound healing. On appropriate medications.     Hypertension blood pressure control important in reducing the progression of atherosclerotic disease. On appropriate oral medications.   Festus Barren, MD  08/26/2023 3:43 PM    This note was created with Dragon medical transcription system.  Any errors from dictation are purely unintentional

## 2023-08-26 NOTE — Assessment & Plan Note (Signed)
 Duplex shows a patent access that clinically is working well.  We can now take out her PermCath.  Continue to use the AV fistula.  Recheck with duplex in 6 months.

## 2023-08-26 NOTE — Assessment & Plan Note (Signed)
 Aneurysm sac seemed enlarged on her CT scan but no obvious signs of type I or III endoleak was present.  When she follows up with her next dialysis access duplex, we will perform an EVAR duplex as well.

## 2023-08-27 ENCOUNTER — Telehealth (INDEPENDENT_AMBULATORY_CARE_PROVIDER_SITE_OTHER): Payer: Self-pay

## 2023-08-27 NOTE — Telephone Encounter (Signed)
I attempted to contact the patient to schedule her for a permcath removal. A message was left for a return call.

## 2023-08-27 NOTE — Telephone Encounter (Signed)
 Patient called back and is on the schedule with Dr. Wyn Quaker on 09/01/23 with a 2:15 pm arrival time to the [facility]. Pre-procedure instructions were discussed and will be sent to Mychart.

## 2023-09-01 ENCOUNTER — Encounter: Payer: Self-pay | Admitting: Vascular Surgery

## 2023-09-01 ENCOUNTER — Encounter
Admission: RE | Disposition: A | Discharge status: Home / Self Care | Payer: Self-pay | Source: Home / Self Care | Attending: Vascular Surgery

## 2023-09-01 ENCOUNTER — Ambulatory Visit
Admission: RE | Admit: 2023-09-01 | Discharge: 2023-09-01 | Disposition: A | Discharge status: Home / Self Care | Attending: Vascular Surgery | Admitting: Vascular Surgery

## 2023-09-01 ENCOUNTER — Other Ambulatory Visit: Payer: Self-pay

## 2023-09-01 DIAGNOSIS — Z79899 Other long term (current) drug therapy: Secondary | ICD-10-CM | POA: Insufficient documentation

## 2023-09-01 DIAGNOSIS — N186 End stage renal disease: Secondary | ICD-10-CM | POA: Diagnosis not present

## 2023-09-01 DIAGNOSIS — I132 Hypertensive heart and chronic kidney disease with heart failure and with stage 5 chronic kidney disease, or end stage renal disease: Secondary | ICD-10-CM | POA: Insufficient documentation

## 2023-09-01 DIAGNOSIS — I723 Aneurysm of iliac artery: Secondary | ICD-10-CM | POA: Insufficient documentation

## 2023-09-01 DIAGNOSIS — F17210 Nicotine dependence, cigarettes, uncomplicated: Secondary | ICD-10-CM | POA: Insufficient documentation

## 2023-09-01 DIAGNOSIS — Z95828 Presence of other vascular implants and grafts: Secondary | ICD-10-CM

## 2023-09-01 DIAGNOSIS — Z452 Encounter for adjustment and management of vascular access device: Secondary | ICD-10-CM | POA: Diagnosis not present

## 2023-09-01 DIAGNOSIS — Z992 Dependence on renal dialysis: Secondary | ICD-10-CM | POA: Diagnosis not present

## 2023-09-01 DIAGNOSIS — I509 Heart failure, unspecified: Secondary | ICD-10-CM | POA: Insufficient documentation

## 2023-09-01 DIAGNOSIS — Z4901 Encounter for fitting and adjustment of extracorporeal dialysis catheter: Secondary | ICD-10-CM | POA: Diagnosis present

## 2023-09-01 DIAGNOSIS — E1122 Type 2 diabetes mellitus with diabetic chronic kidney disease: Secondary | ICD-10-CM | POA: Diagnosis not present

## 2023-09-01 DIAGNOSIS — I714 Abdominal aortic aneurysm, without rupture, unspecified: Secondary | ICD-10-CM | POA: Insufficient documentation

## 2023-09-01 HISTORY — PX: DIALYSIS/PERMA CATHETER REMOVAL: CATH118289

## 2023-09-01 SURGERY — DIALYSIS/PERMA CATHETER REMOVAL
Anesthesia: LOCAL

## 2023-09-01 MED ORDER — LIDOCAINE-EPINEPHRINE (PF) 1 %-1:200000 IJ SOLN
INTRAMUSCULAR | Status: DC | PRN
  Administered 2023-09-01: 10 mL

## 2023-09-01 MED ORDER — MIDAZOLAM HCL 2 MG/ML PO SYRP
8.0000 mg | ORAL_SOLUTION | Freq: Once | ORAL | Status: AC
  Administered 2023-09-01: 8 mg via ORAL
  Filled 2023-09-01: qty 5

## 2023-09-01 SURGICAL SUPPLY — 1 items: TRAY LACERATION DISP STRL (SET/KITS/TRAYS/PACK) IMPLANT

## 2023-09-01 NOTE — Interval H&P Note (Signed)
 History and Physical Interval Note:  09/01/2023 3:22 PM  Michelle Murillo  has presented today for surgery, with the diagnosis of Perma Cath Removal   End Stage Renal.  The various methods of treatment have been discussed with the patient and family. After consideration of risks, benefits and other options for treatment, the patient has consented to  Procedure(s): DIALYSIS/PERMA CATHETER REMOVAL (N/A) as a surgical intervention.  The patient's history has been reviewed, patient examined, no change in status, stable for surgery.  I have reviewed the patient's chart and labs.  Questions were answered to the patient's satisfaction.     Festus Barren

## 2023-09-01 NOTE — Discharge Instructions (Signed)
 Tunneled Catheter Removal, Care After Refer to this sheet in the next few weeks. These instructions provide you with information about caring for yourself after your procedure. Your health care provider may also give you more specific instructions. Your treatment has been planned according to current medical practices, but problems sometimes occur. Call your health care provider if you have any problems or questions after your procedure. What can I expect after the procedure? After the procedure, it is common to have: Some mild redness, swelling, and pain around your catheter site.   Follow these instructions at home: Incision care  Check your removal site  every day for signs of infection. Check for: More redness, swelling, or pain. More fluid or blood. Warmth. Pus or a bad smell. Remove your dressing in 48hrs leave open to air  Activity  Return to your normal activities as told by your health care provider. Ask your health care provider what activities are safe for you. Do not lift anything that is heavier than 10 lb (4.5 kg) for 3 days  You may shower tomorrow  Contact a health care provider if: You have more fluid or blood coming from your removal site You have more redness, swelling, or pain at your incisions or around the area where your catheter was removed Your removal site feel warm to the touch. You feel unusually weak. You feel nauseous.. Get help right away if You have swelling in your arm, shoulder, neck, or face. You develop chest pain. You have difficulty breathing. You feel dizzy or light-headed. You have pus or a bad smell coming from your removal site You have a fever. You develop bleeding from your removal site, and your bleeding does not stop. This information is not intended to replace advice given to you by your health care provider. Make sure you discuss any questions you have with your health care provider. Document Released: 04/29/2012 Document Revised:  01/14/2016 Document Reviewed: 02/06/2015 Elsevier Interactive Patient Education  2017 [employer].

## 2023-09-01 NOTE — Op Note (Signed)
 Operative Note     Preoperative diagnosis:   1. ESRD with functional permanent access  Postoperative diagnosis:  1. ESRD with functional permanent access  Procedure:  Removal of right Permcath  Surgeon:  Festus Barren, MD  Anesthesia:  Local  EBL:  Minimal  Indication for the Procedure:  The patient has a functional permanent dialysis access and no longer needs their permcath.  This can be removed.  Risks and benefits are discussed and informed consent is obtained.  Description of the Procedure:  The patient's right neck, chest and existing catheter were sterilely prepped and draped. The area around the catheter was anesthetized copiously with 1% lidocaine. The catheter was dissected out with curved hemostats until the cuff was freed from the surrounding fibrous sheath. The fiber sheath was transected, and the catheter was then removed in its entirety using gentle traction. Pressure was held and sterile dressings were placed. The patient tolerated the procedure well and was taken to the recovery room in stable condition.     Festus Barren  09/01/2023, 4:11 PM This note was created with Dragon Medical transcription system. Any errors in dictation are purely unintentional.

## 2023-09-02 ENCOUNTER — Encounter: Payer: Self-pay | Admitting: Vascular Surgery

## 2023-10-14 ENCOUNTER — Encounter (INDEPENDENT_AMBULATORY_CARE_PROVIDER_SITE_OTHER): Payer: Self-pay

## 2023-12-09 ENCOUNTER — Other Ambulatory Visit (INDEPENDENT_AMBULATORY_CARE_PROVIDER_SITE_OTHER)

## 2023-12-09 ENCOUNTER — Encounter (INDEPENDENT_AMBULATORY_CARE_PROVIDER_SITE_OTHER)

## 2023-12-09 ENCOUNTER — Ambulatory Visit (INDEPENDENT_AMBULATORY_CARE_PROVIDER_SITE_OTHER): Admitting: Vascular Surgery

## 2024-01-13 ENCOUNTER — Ambulatory Visit (INDEPENDENT_AMBULATORY_CARE_PROVIDER_SITE_OTHER): Admitting: Vascular Surgery

## 2024-01-13 ENCOUNTER — Ambulatory Visit (INDEPENDENT_AMBULATORY_CARE_PROVIDER_SITE_OTHER)

## 2024-01-13 ENCOUNTER — Encounter (INDEPENDENT_AMBULATORY_CARE_PROVIDER_SITE_OTHER): Payer: Self-pay | Admitting: Vascular Surgery

## 2024-01-13 VITALS — BP 133/66 | HR 71 | Resp 16 | Wt 189.6 lb

## 2024-01-13 DIAGNOSIS — E119 Type 2 diabetes mellitus without complications: Secondary | ICD-10-CM

## 2024-01-13 DIAGNOSIS — I7143 Infrarenal abdominal aortic aneurysm, without rupture: Secondary | ICD-10-CM

## 2024-01-13 DIAGNOSIS — N186 End stage renal disease: Secondary | ICD-10-CM

## 2024-01-13 DIAGNOSIS — Z992 Dependence on renal dialysis: Secondary | ICD-10-CM

## 2024-01-13 DIAGNOSIS — S37012D Minor contusion of left kidney, subsequent encounter: Secondary | ICD-10-CM

## 2024-01-13 DIAGNOSIS — I10 Essential (primary) hypertension: Secondary | ICD-10-CM

## 2024-01-13 NOTE — Progress Notes (Signed)
 MRN : [id number]  Michelle Murillo is a 78 y.o. (1945-10-23) female who presents with chief complaint of  Chief Complaint  Patient presents with   Follow-up    A/V fistula eval due to knot on the left forearm  .  History of Present Illness: Patient returns today in follow up of multiple vascular issues.  She is about 6 years status post endovascular aortoiliac aneurysm repair.  She has no current aneurysm related symptoms.  Her right iliac artery is significantly enlarged and could be seen on her angiogram we performed for renal embolization earlier this year.  There was no obvious flow into the aneurysm but the sac was enlarged.  Duplex today confirms this enlargement and it now measures 9.8 cm in maximal diameter.  Again, no endoleak could be seen with duplex.  The angiogram did seem to demonstrate good seal distal to the aneurysm in the right external iliac artery. She is also followed for her dialysis access.  She denies any open wounds in the left arm, but does have some numbness in the 4th and 5th fingers on the left hand and some pain.  This is not every time.  This does worsen with dialysis.  Her fistula itself is working well without any major issues. Duplex shows a patent left brachiocephalic AV fistula.   Current Outpatient Medications  Medication Sig Dispense Refill   albuterol  (VENTOLIN  HFA) 108 (90 Base) MCG/ACT inhaler Inhale 2 puffs into the lungs every 6 (six) hours as needed for wheezing or shortness of breath.     allopurinol (ZYLOPRIM) 300 MG tablet Take 300 mg by mouth daily.      aspirin  EC 81 MG tablet Take 81 mg by mouth daily.     atorvastatin (LIPITOR) 20 MG tablet Take 20 mg by mouth daily.     calcitRIOL (ROCALTROL) 0.25 MCG capsule Take 0.25 mcg by mouth daily.     furosemide (LASIX) 40 MG tablet Take 40 mg by mouth daily as needed (for fluid retention (swollen ankles)---typically no more than twice a week). Takes on T, Th, Sat, Sun only if Diastolic is 60 or  above     gabapentin (NEURONTIN) 100 MG capsule Take 100 mg by mouth 3 (three) times daily.     metoprolol tartrate (LOPRESSOR) 50 MG tablet Take 50 mg by mouth 2 (two) times daily.     pantoprazole (PROTONIX) 40 MG tablet Take 40 mg by mouth 2 (two) times daily.     SYMBICORT 160-4.5 MCG/ACT inhaler Inhale 2 puffs into the lungs.     No current facility-administered medications for this visit.    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  pt was in a coma for 18 days after this.(during CABG)   COPD (chronic obstructive pulmonary disease) (HCC)    Coronary artery disease    a.) s/p 4v CABG 06/12/2011; b.) LHC 03/07/2020: 10% dRCA, 75% p-mRCA, 75% pLCx, 50% p-mLAD, 45% mLAD; SVG-RCA and SVG-OM occluded   Diabetes mellitus without complication (HCC)    borderline, does not take any medication   Dyspnea    ESRD on hemodialysis (HCC) 2019   a.) M-W-F   GERD (gastroesophageal reflux disease)  Gout    Hypertension    Legally blind    unable to see any more than shapes   Right ovarian cyst    S/P CABG x 4 06/12/2011   a.) LIMA-LAD, SVG-OM1, SVG-LCx, SVG-RCA --> procedure complicated by bradycardic arrest and respiratory failure (pulmonary edema) resulting in prolonged ICU admission. Developed rapid A.fib and required DCCV x 3 durrsing ICU admission.   Seizures (HCC) 2013   only happened during cabg procedure while on the table   Sleep apnea    a.) does not use noctural PAP therapy    Past Surgical History:  Procedure Laterality Date   A/V FISTULAGRAM Left 01/13/2023   Procedure: A/V Fistulagram;  Surgeon: Marea Selinda RAMAN, MD;  Location: [facility] INVASIVE CV LAB;  Service: Cardiovascular;  Laterality: Left;   ABDOMINAL AORTOGRAM N/A  06/23/2017   Procedure: ABDOMINAL AORTOGRAM;  Surgeon: Marea Selinda RAMAN, MD;  Location: [facility];  Service: Cardiovascular;  Laterality: N/A;   ABDOMINAL SURGERY  2017   aneurysm repair with dr. Chae Shuster   AV FISTULA PLACEMENT Left 11/07/2022   Procedure: ARTERIOVENOUS (AV) FISTULA CREATION (BRACHIALCEPHALIC);  Surgeon: Marea Selinda RAMAN, MD;  Location: [facility] ORS;  Service: Vascular;  Laterality: Left;   BRONCHOSCOPY  2015   done at [facility]. carcinoid of airways   CHOLECYSTECTOMY     age 44   CORONARY ARTERY BYPASS GRAFT  06/12/2011   DIALYSIS/PERMA CATHETER INSERTION N/A 06/24/2022   Procedure: DIALYSIS/PERMA CATHETER INSERTION;  Surgeon: Marea Selinda RAMAN, MD;  Location: [facility];  Service: Cardiovascular;  Laterality: N/A;   DIALYSIS/PERMA CATHETER INSERTION N/A 09/03/2022   Procedure: DIALYSIS/PERMA CATHETER INSERTION;  Surgeon: Jama Cordella MATSU, MD;  Location: [facility];  Service: Cardiovascular;  Laterality: N/A;   DIALYSIS/PERMA CATHETER INSERTION N/A 10/16/2022   Procedure: DIALYSIS/PERMA CATHETER INSERTION;  Surgeon: Jama Cordella MATSU, MD;  Location: [facility];  Service: Cardiovascular;  Laterality: N/A;   DIALYSIS/PERMA CATHETER INSERTION N/A 10/03/2022   Procedure: DIALYSIS/PERMA CATHETER INSERTION;  Surgeon: Marea Selinda RAMAN, MD;  Location: [facility];  Service: Cardiovascular;  Laterality: N/A;   DIALYSIS/PERMA CATHETER REMOVAL N/A 09/01/2023   Procedure: DIALYSIS/PERMA CATHETER REMOVAL;  Surgeon: Marea Selinda RAMAN, MD;  Location: [facility];  Service: Cardiovascular;  Laterality: N/A;   EMBOLIZATION (CATH LAB) Left 07/23/2023   Procedure: EMBOLIZATION;  Surgeon: Marea Selinda RAMAN, MD;  Location: [facility];  Service: Cardiovascular;  Laterality: Left;  Left Renal Embolization   ENDOVASCULAR REPAIR/STENT GRAFT N/A 07/06/2018   Procedure: ENDOVASCULAR REPAIR/STENT GRAFT;  Surgeon: Marea Selinda RAMAN, MD;  Location: [facility];  Service:  Cardiovascular;  Laterality: N/A;   laryngeal nodule surgery     LEFT HEART CATH AND CORS/GRAFTS ANGIOGRAPHY Left 03/07/2020   Procedure: LEFT HEART CATH AND CORS/GRAFTS ANGIOGRAPHY;  Surgeon: Hester Wolm PARAS, MD;  Location: [facility];  Service: Cardiovascular;  Laterality: Left;   OOPHORECTOMY Right      Social History   Tobacco Use   Smoking status: Every Day    Current packs/day: 0.50    Types: Cigarettes   Smokeless tobacco: Never   Tobacco comments:    6-7 cigs per day  Vaping Use   Vaping status: Never Used  Substance Use Topics   Alcohol  use: No   Drug use: No      Family History  Problem Relation Age of Onset   Congestive Heart Failure Mother    Heart attack Mother  Cancer Father    Liver cancer Father    Cancer Paternal Grandfather     Allergies  Allergen Reactions   Isosorbide Nitrate Nausea And Vomiting   Bupropion Nausea Only    Other reaction(s): Other (See Comments) I didn't do well with it..hallucinates   Morphine And Codeine Itching    Per pt morphine only      REVIEW OF SYSTEMS (Negative unless checked)   Constitutional: [] Weight loss  [] Fever  [] Chills Cardiac: [] Chest pain   [] Chest pressure   [] Palpitations   [] Shortness of breath when laying flat   [] Shortness of breath at rest   [x] Shortness of breath with exertion. Vascular:  [] Pain in legs with walking   [] Pain in legs at rest   [] Pain in legs when laying flat   [] Claudication   [] Pain in feet when walking  [] Pain in feet at rest  [] Pain in feet when laying flat   [] History of DVT   [] Phlebitis   [] Swelling in legs   [] Varicose veins   [] Non-healing ulcers Pulmonary:   [] Uses home oxygen   [] Productive cough   [] Hemoptysis   [] Wheeze  [x] COPD   [] Asthma Neurologic:  [] Dizziness  [] Blackouts   [] Seizures   [] History of stroke   [] History of TIA  [] Aphasia   [] Temporary blindness   [] Dysphagia   [] Weakness or numbness in arms   [] Weakness or numbness in legs Musculoskeletal:   [x] Arthritis   [] Joint swelling   [x] Joint pain   [] Low back pain Hematologic:  [] Easy bruising  [] Easy bleeding   [] Hypercoagulable state   [x] Anemic  [] Hepatitis Gastrointestinal:  [] Blood in stool   [] Vomiting blood  [] Gastroesophageal reflux/heartburn   [] Abdominal pain Genitourinary:  [x] Chronic kidney disease   [] Difficult urination  [] Frequent urination  [] Burning with urination   [] Hematuria Skin:  [] Rashes   [] Ulcers   [] Wounds Psychological:  [] History of anxiety   []  History of major depression.   Physical Examination  BP 133/66   Pulse 71   Resp 16   Wt 189 lb 9.6 oz (86 kg)   BMI 32.54 kg/m  Gen:  WD/WN, NAD. Appears younger than stated age. Head: [place]/AT, No temporalis wasting. Ear/Nose/Throat: Hearing grossly intact, nares w/o erythema or drainage Eyes: Conjunctiva clear. Sclera non-icteric. Visual acuity poor. Neck: Supple.  Trachea midline Pulmonary:  Good air movement, no use of accessory muscles.  Cardiac: RRR, no JVD Vascular: good thrill in left arm aVF Vessel Right Left  Radial Palpable Palpable                                   Gastrointestinal: soft, non-tender/non-distended. No guarding/reflex.  Musculoskeletal: M/S 5/5 throughout.  No deformity or atrophy. Trace LE edema. Neurologic: Sensation grossly intact in extremities.  Symmetrical.  Speech is fluent.  Psychiatric: Judgment intact, Mood & affect appropriate for pt's clinical situation. Dermatologic: No rashes or ulcers noted.  No cellulitis or open wounds.     Labs No results found for this or any previous visit (from the past 2160 hours).  Radiology No results found.  Assessment/Plan  AAA (abdominal aortic aneurysm) without rupture (HCC) Her right iliac artery is significantly enlarged and could be seen on her angiogram we performed for renal embolization earlier this year.  There was no obvious flow into the aneurysm but the sac was enlarged.  Duplex today confirms this enlargement  and it now measures 9.8 cm in maximal diameter.  Again, no endoleak could be seen with duplex.  The angiogram did seem to demonstrate good seal distal to the aneurysm in the right external iliac artery. Without an obvious endoleak and in this location, I think a type II endoleak is possible but this is not well-seen.  Given her multiple ongoing issues and no flow seen in the aneurysm sac on angiogram or duplex, I am going to plan a follow-up in 6 months.  If this continues to enlarge, we may get a CT scan for further evaluation.  ESRD on dialysis [facility]) Duplex shows a patent left brachiocephalic AV fistula.  She does have some mild steal symptoms in the left hand.  I have recommended she wear gloves and try to do more hand and arm exercises.  If this becomes worse, we may have to consider banding the fistula or other interventions, but as long as her symptoms are mild and tolerable I would try the conservative measures.  Recheck in 6 months.  Renal hematoma, left Status post left renal artery embolization for hemorrhage with good results.   Diabetes mellitus without complication (HCC) blood glucose control important in reducing the progression of atherosclerotic disease. Also, involved in wound healing. On appropriate medications.     Hypertension blood pressure control important in reducing the progression of atherosclerotic disease. On appropriate oral medications.  Selinda Gu, MD  01/13/2024 12:09 PM    This note was created with Dragon medical transcription system.  Any errors from dictation are purely unintentional

## 2024-01-13 NOTE — Assessment & Plan Note (Signed)
 Duplex shows a patent left brachiocephalic AV fistula.  She does have some mild steal symptoms in the left hand.  I have recommended she wear gloves and try to do more hand and arm exercises.  If this becomes worse, we may have to consider banding the fistula or other interventions, but as long as her symptoms are mild and tolerable I would try the conservative measures.  Recheck in 6 months.

## 2024-01-13 NOTE — Assessment & Plan Note (Signed)
 Her right iliac artery is significantly enlarged and could be seen on her angiogram we performed for renal embolization earlier this year.  There was no obvious flow into the aneurysm but the sac was enlarged.  Duplex today confirms this enlargement and it now measures 9.8 cm in maximal diameter.  Again, no endoleak could be seen with duplex.  The angiogram did seem to demonstrate good seal distal to the aneurysm in the right external iliac artery. Without an obvious endoleak and in this location, I think a type II endoleak is possible but this is not well-seen.  Given her multiple ongoing issues and no flow seen in the aneurysm sac on angiogram or duplex, I am going to plan a follow-up in 6 months.  If this continues to enlarge, we may get a CT scan for further evaluation.

## 2024-02-10 ENCOUNTER — Ambulatory Visit
Admission: RE | Admit: 2024-02-10 | Discharge: 2024-02-10 | Disposition: A | Discharge status: Home / Self Care | Attending: Family Medicine | Admitting: Family Medicine

## 2024-02-10 ENCOUNTER — Other Ambulatory Visit: Payer: Self-pay | Admitting: Family Medicine

## 2024-02-10 ENCOUNTER — Ambulatory Visit
Admission: RE | Admit: 2024-02-10 | Discharge: 2024-02-10 | Disposition: A | Discharge status: Home / Self Care | Source: Ambulatory Visit | Attending: Family Medicine | Admitting: Family Medicine

## 2024-02-10 DIAGNOSIS — M25511 Pain in right shoulder: Secondary | ICD-10-CM | POA: Diagnosis present

## 2024-02-24 ENCOUNTER — Ambulatory Visit (INDEPENDENT_AMBULATORY_CARE_PROVIDER_SITE_OTHER): Admitting: Vascular Surgery

## 2024-02-24 ENCOUNTER — Other Ambulatory Visit (INDEPENDENT_AMBULATORY_CARE_PROVIDER_SITE_OTHER)

## 2024-02-24 ENCOUNTER — Encounter (INDEPENDENT_AMBULATORY_CARE_PROVIDER_SITE_OTHER)

## 2024-03-02 ENCOUNTER — Other Ambulatory Visit: Payer: Self-pay | Admitting: Family Medicine

## 2024-03-02 DIAGNOSIS — Z9181 History of falling: Secondary | ICD-10-CM

## 2024-03-03 ENCOUNTER — Ambulatory Visit
Admission: RE | Admit: 2024-03-03 | Discharge: 2024-03-03 | Disposition: A | Discharge status: Home / Self Care | Source: Ambulatory Visit | Attending: Family Medicine | Admitting: Family Medicine

## 2024-03-03 DIAGNOSIS — I6789 Other cerebrovascular disease: Secondary | ICD-10-CM | POA: Insufficient documentation

## 2024-03-03 DIAGNOSIS — Z9181 History of falling: Secondary | ICD-10-CM | POA: Insufficient documentation

## 2024-03-03 DIAGNOSIS — M19011 Primary osteoarthritis, right shoulder: Secondary | ICD-10-CM | POA: Insufficient documentation

## 2024-03-03 DIAGNOSIS — Z043 Encounter for examination and observation following other accident: Secondary | ICD-10-CM | POA: Diagnosis present

## 2024-03-24 ENCOUNTER — Other Ambulatory Visit: Payer: Self-pay

## 2024-03-24 ENCOUNTER — Emergency Department

## 2024-03-24 ENCOUNTER — Emergency Department
Admission: EM | Admit: 2024-03-24 | Discharge: 2024-03-24 | Disposition: A | Discharge status: Home / Self Care | Attending: Emergency Medicine | Admitting: Emergency Medicine

## 2024-03-24 DIAGNOSIS — Z951 Presence of aortocoronary bypass graft: Secondary | ICD-10-CM | POA: Insufficient documentation

## 2024-03-24 DIAGNOSIS — I509 Heart failure, unspecified: Secondary | ICD-10-CM | POA: Insufficient documentation

## 2024-03-24 DIAGNOSIS — R110 Nausea: Secondary | ICD-10-CM | POA: Diagnosis not present

## 2024-03-24 DIAGNOSIS — I2510 Atherosclerotic heart disease of native coronary artery without angina pectoris: Secondary | ICD-10-CM | POA: Diagnosis not present

## 2024-03-24 DIAGNOSIS — Z992 Dependence on renal dialysis: Secondary | ICD-10-CM | POA: Insufficient documentation

## 2024-03-24 DIAGNOSIS — I132 Hypertensive heart and chronic kidney disease with heart failure and with stage 5 chronic kidney disease, or end stage renal disease: Secondary | ICD-10-CM | POA: Insufficient documentation

## 2024-03-24 DIAGNOSIS — R0789 Other chest pain: Secondary | ICD-10-CM | POA: Insufficient documentation

## 2024-03-24 DIAGNOSIS — J449 Chronic obstructive pulmonary disease, unspecified: Secondary | ICD-10-CM | POA: Diagnosis not present

## 2024-03-24 DIAGNOSIS — N186 End stage renal disease: Secondary | ICD-10-CM | POA: Insufficient documentation

## 2024-03-24 DIAGNOSIS — R079 Chest pain, unspecified: Secondary | ICD-10-CM

## 2024-03-24 DIAGNOSIS — Z85118 Personal history of other malignant neoplasm of bronchus and lung: Secondary | ICD-10-CM | POA: Diagnosis not present

## 2024-03-24 LAB — CBC WITH DIFFERENTIAL/PLATELET
Abs Immature Granulocytes: 0.03 K/uL (ref 0.00–0.07)
Basophils Absolute: 0 K/uL (ref 0.0–0.1)
Basophils Relative: 0 %
Eosinophils Absolute: 0.1 K/uL (ref 0.0–0.5)
Eosinophils Relative: 2 %
HCT: 30.7 % — ABNORMAL LOW (ref 36.0–46.0)
Hemoglobin: 9.5 g/dL — ABNORMAL LOW (ref 12.0–15.0)
Immature Granulocytes: 1 %
Lymphocytes Relative: 22 %
Lymphs Abs: 1.1 K/uL (ref 0.7–4.0)
MCH: 31.3 pg (ref 26.0–34.0)
MCHC: 30.9 g/dL (ref 30.0–36.0)
MCV: 101 fL — ABNORMAL HIGH (ref 80.0–100.0)
Monocytes Absolute: 0.5 K/uL (ref 0.1–1.0)
Monocytes Relative: 11 %
Neutro Abs: 3.2 K/uL (ref 1.7–7.7)
Neutrophils Relative %: 64 %
Platelets: 135 K/uL — ABNORMAL LOW (ref 150–400)
RBC: 3.04 MIL/uL — ABNORMAL LOW (ref 3.87–5.11)
RDW: 14.8 % (ref 11.5–15.5)
WBC: 5 K/uL (ref 4.0–10.5)
nRBC: 0 % (ref 0.0–0.2)

## 2024-03-24 LAB — COMPREHENSIVE METABOLIC PANEL WITH GFR
ALT: 9 U/L (ref 0–44)
AST: 17 U/L (ref 15–41)
Albumin: 3.4 g/dL — ABNORMAL LOW (ref 3.5–5.0)
Alkaline Phosphatase: 59 U/L (ref 38–126)
Anion gap: 16 — ABNORMAL HIGH (ref 5–15)
BUN: 63 mg/dL — ABNORMAL HIGH (ref 8–23)
CO2: 26 mmol/L (ref 22–32)
Calcium: 8.4 mg/dL — ABNORMAL LOW (ref 8.9–10.3)
Chloride: 103 mmol/L (ref 98–111)
Creatinine, Ser: 8.26 mg/dL — ABNORMAL HIGH (ref 0.44–1.00)
GFR, Estimated: 5 mL/min — ABNORMAL LOW (ref >60)
Glucose, Bld: 79 mg/dL (ref 70–99)
Potassium: 4.9 mmol/L (ref 3.5–5.1)
Sodium: 145 mmol/L (ref 135–145)
Total Bilirubin: 0.7 mg/dL (ref 0.0–1.2)
Total Protein: 7.2 g/dL (ref 6.5–8.1)

## 2024-03-24 LAB — LIPASE, BLOOD: Lipase: 50 U/L (ref 11–51)

## 2024-03-24 LAB — TROPONIN I (HIGH SENSITIVITY)
Troponin I (High Sensitivity): 32 ng/L — ABNORMAL HIGH (ref <18)
Troponin I (High Sensitivity): 32 ng/L — ABNORMAL HIGH (ref <18)

## 2024-03-24 MED ORDER — CALCIUM CARBONATE ANTACID 500 MG PO CHEW
1.0000 | CHEWABLE_TABLET | Freq: Once | ORAL | Status: AC
  Administered 2024-03-24: 200 mg via ORAL
  Filled 2024-03-24: qty 1

## 2024-03-24 MED ORDER — IOHEXOL 300 MG/ML  SOLN: 100.0000 mL | Freq: Once | INTRAMUSCULAR | Status: DC | PRN

## 2024-03-24 MED ORDER — IOHEXOL 350 MG/ML SOLN
100.0000 mL | Freq: Once | INTRAVENOUS | Status: AC | PRN
  Administered 2024-03-24: 100 mL via INTRAVENOUS

## 2024-03-24 MED ORDER — FAMOTIDINE 20 MG PO TABS
40.0000 mg | ORAL_TABLET | Freq: Once | ORAL | Status: AC
  Administered 2024-03-24: 40 mg via ORAL
  Filled 2024-03-24: qty 2

## 2024-03-24 NOTE — ED Triage Notes (Addendum)
 Pt comes in via [facility] from [facility]. Pt did not receive today, her last treatment was Monday. When Pt arrived for treatment she had a sudden onset chest pain, SOB and dizziness. . Pt rates pain 4/10 at this time. Pt has aspirin  before EMS arrival, and now has an inch of nitroglycerin past on her chest from EMS. Pt has a history of quadruple bypass. Pt is alert and oriented x4 with no signs of acute distress at this time.

## 2024-03-24 NOTE — ED Notes (Signed)
Lab called for assistance with lab draw.

## 2024-03-24 NOTE — ED Notes (Signed)
 Patient transported to CT

## 2024-03-24 NOTE — ED Notes (Signed)
 Called CCMD and added pt to board

## 2024-03-24 NOTE — ED Provider Notes (Signed)
 [facility] Provider Note    Event Date/Time   First MD Initiated Contact with Patient 03/24/24 [phone]     (approximate)   History   Chief Complaint: Chest Pain   HPI  Michelle Murillo is a 78 y.o. female with a history of hypertension COPD diabetes end-stage renal disease on hemodialysis Monday Wednesday Friday, prior CABG who comes to the ED due to chest pain.  She was in her usual state of health, went to dialysis this morning, and while in the chair waiting to have her access cannulated, she started developing generalized abdominal pain that then migrated into the center of her chest.  Nonradiating.  Associated with nausea but no shortness of breath diaphoresis vomiting palpitations or lightheadedness.  Currently pain has significantly improved, but still remains mild.  Denies heaviness or pressure.  Has been compliant with her medications.  No vomiting diarrhea or recent illness.  Last dialysis was 2 days ago on Monday.        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  pt was in a coma for 18 days after this.(during CABG)   COPD (chronic obstructive pulmonary disease) (HCC)    Coronary artery disease    a.) s/p 4v CABG 06/12/2011; b.) LHC 03/07/2020: 10% dRCA, 75% p-mRCA, 75% pLCx, 50% p-mLAD, 45% mLAD; SVG-RCA and SVG-OM occluded   Diabetes mellitus without complication (HCC)    borderline, does not take any medication   Dyspnea    ESRD on hemodialysis (HCC) 2019   a.) M-W-F   GERD (gastroesophageal reflux disease)    Gout    Hypertension    Legally blind    unable to see any more than shapes   Right ovarian cyst    S/P CABG x 4 06/12/2011   a.) LIMA-LAD, SVG-OM1, SVG-LCx, SVG-RCA --> procedure  complicated by bradycardic arrest and respiratory failure (pulmonary edema) resulting in prolonged ICU admission. Developed rapid A.fib and required DCCV x 3 durrsing ICU admission.   Seizures (HCC) 2013   only happened during cabg procedure while on the table   Sleep apnea    a.) does not use noctural PAP therapy    Current Outpatient Rx   Order #: [id number] Class: Historical Med   Order #: [id number] Class: Historical Med   Order #: [id number] Class: Historical Med   Order #: [id number] Class: Historical Med   Order #: [id number] Class: Historical Med   Order #: [id number] Class: Historical Med   Order #: [id number] Class: Historical Med   Order #: [id number] Class: Historical Med   Order #: [id number] Class: Historical Med   Order #: [id number] Class: Historical Med    Past Surgical History:  Procedure Laterality Date   A/V FISTULAGRAM Left 01/13/2023   Procedure: A/V Fistulagram;  Surgeon: Marea Selinda RAMAN, MD;  Location: [facility];  Service: Cardiovascular;  Laterality: Left;   ABDOMINAL AORTOGRAM N/A 06/23/2017   Procedure: ABDOMINAL AORTOGRAM;  Surgeon: Marea Selinda RAMAN, MD;  Location: [facility];  Service: Cardiovascular;  Laterality: N/A;   ABDOMINAL SURGERY  2017   aneurysm repair with dr. dew   AV FISTULA PLACEMENT Left 11/07/2022   Procedure: ARTERIOVENOUS (AV) FISTULA CREATION (BRACHIALCEPHALIC);  Surgeon: Marea Selinda RAMAN, MD;  Location: [facility] ORS;  Service: Vascular;  Laterality: Left;   BRONCHOSCOPY  2015   done at [facility]. carcinoid of airways   CHOLECYSTECTOMY     age 1   CORONARY ARTERY BYPASS GRAFT  06/12/2011   DIALYSIS/PERMA CATHETER INSERTION N/A 06/24/2022   Procedure: DIALYSIS/PERMA CATHETER INSERTION;  Surgeon: Marea Selinda RAMAN, MD;  Location: [facility];  Service: Cardiovascular;  Laterality: N/A;   DIALYSIS/PERMA CATHETER INSERTION N/A 09/03/2022   Procedure: DIALYSIS/PERMA CATHETER INSERTION;  Surgeon: Jama Cordella MATSU, MD;  Location: [facility];   Service: Cardiovascular;  Laterality: N/A;   DIALYSIS/PERMA CATHETER INSERTION N/A 10/16/2022   Procedure: DIALYSIS/PERMA CATHETER INSERTION;  Surgeon: Jama Cordella MATSU, MD;  Location: [facility];  Service: Cardiovascular;  Laterality: N/A;   DIALYSIS/PERMA CATHETER INSERTION N/A 10/03/2022   Procedure: DIALYSIS/PERMA CATHETER INSERTION;  Surgeon: Marea Selinda RAMAN, MD;  Location: [facility];  Service: Cardiovascular;  Laterality: N/A;   DIALYSIS/PERMA CATHETER REMOVAL N/A 09/01/2023   Procedure: DIALYSIS/PERMA CATHETER REMOVAL;  Surgeon: Marea Selinda RAMAN, MD;  Location: [facility];  Service: Cardiovascular;  Laterality: N/A;   EMBOLIZATION (CATH LAB) Left 07/23/2023   Procedure: EMBOLIZATION;  Surgeon: Marea Selinda RAMAN, MD;  Location: [facility];  Service: Cardiovascular;  Laterality: Left;  Left Renal Embolization   ENDOVASCULAR STENT GRAFT (AAA) N/A 07/06/2018   Procedure: ENDOVASCULAR REPAIR/STENT GRAFT;  Surgeon: Marea Selinda RAMAN, MD;  Location: [facility];  Service: Cardiovascular;  Laterality: N/A;   laryngeal nodule surgery     LEFT HEART CATH AND CORS/GRAFTS ANGIOGRAPHY Left 03/07/2020   Procedure: LEFT HEART CATH AND CORS/GRAFTS ANGIOGRAPHY;  Surgeon: Hester Wolm PARAS, MD;  Location: [facility];  Service: Cardiovascular;  Laterality: Left;   OOPHORECTOMY Right     Physical Exam   Triage Vital Signs: ED Triage Vitals [03/24/24 0714]  Encounter Vitals Group     BP      Girls Systolic BP Percentile      Girls Diastolic BP Percentile      Boys Systolic BP Percentile      Boys Diastolic BP Percentile      Pulse Rate 87     Resp 17     Temp 99.3 F (37.4 C)     Temp src      SpO2 100 %     Weight      Height      Head Circumference      Peak Flow      Pain Score 4     Pain Loc      Pain Education      Exclude from Growth Chart     Most recent vital signs: Vitals:   03/24/24 0714  Pulse: 87  Resp: 17  Temp: 99.3 F (37.4  C)  SpO2: 100%    General: Awake, no distress.  CV:  Good peripheral perfusion.  Regular rate rhythm Resp:  Normal effort.  Clear lungs Abd:  No distention.  Soft nontender Other:  No lower extremity edema   ED Results / Procedures / Treatments   Labs (all labs ordered are listed, but only abnormal results are displayed) Labs Reviewed  COMPREHENSIVE METABOLIC PANEL WITH GFR - Abnormal; Notable for the following components:      Result Value   BUN 63 (*)    Creatinine, Ser 8.26 (*)    Calcium 8.4 (*)    Albumin 3.4 (*)    GFR, Estimated  5 (*)    Anion gap 16 (*)    All other components within normal limits  CBC WITH DIFFERENTIAL/PLATELET - Abnormal; Notable for the following components:   RBC 3.04 (*)    Hemoglobin 9.5 (*)    HCT 30.7 (*)    MCV 101.0 (*)    Platelets 135 (*)    All other components within normal limits  TROPONIN I (HIGH SENSITIVITY) - Abnormal; Notable for the following components:   Troponin I (High Sensitivity) 32 (*)    All other components within normal limits  TROPONIN I (HIGH SENSITIVITY) - Abnormal; Notable for the following components:   Troponin I (High Sensitivity) 32 (*)    All other components within normal limits  LIPASE, BLOOD     EKG Interpreted by me Sinus rhythm rate of 90.  Normal axis, right bundle branch block.  Normal ST segments, diffuse inferolateral T wave inversions.  Unchanged compared to prior EKG on November 13, 2022.   RADIOLOGY Chest x-ray interpreted by me, unremarkable.  Radiology report reviewed CT angiogram abdomen pelvis unremarkable, no evidence of iliac artery aneurysm acute complication   PROCEDURES:  Procedures   MEDICATIONS ORDERED IN ED: Medications  calcium carbonate (TUMS - dosed in mg elemental calcium) chewable tablet 200 mg of elemental calcium (200 mg of elemental calcium Oral Given 03/24/24 0734)  famotidine  (PEPCID ) tablet 40 mg (40 mg Oral Given 03/24/24 0734)  iohexol  (OMNIPAQUE ) 350 MG/ML  injection 100 mL (100 mLs Intravenous Contrast Given 03/24/24 1011)     IMPRESSION / MDM / ASSESSMENT AND PLAN / ED COURSE  I reviewed the triage vital signs and the nursing notes.  DDx: GERD, pancreatitis, gastritis, non-STEMI, pneumothorax, pneumonia, vagal episode  Patient's presentation is most consistent with acute presentation with potential threat to life or bodily function.  Patient presents with atypical chest pain episode starting recently while waiting to start dialysis.  Nontoxic, not in distress.  Suspect vagal episode versus GERD.  Will give antacids, check labs.   Clinical Course as of 03/24/24 1151  Wed Mar 24, 2024  0848 Pain continues improving.  She is comfortable.  Labs unremarkable, will trend troponin.  Does have a history of right common iliac artery aneurysm status post endograft, will need to obtain CT angiogram of the abdomen pelvis to evaluate for complication [PS]    Clinical Course User Index [PS] Viviann Pastor, MD    ----------------------------------------- 11:50 AM on 03/24/2024 ----------------------------------------- Repeat troponin and CT unremarkable.  Overall reassuring evaluation.  Symptoms have resolved.  She is stable for discharge and outpatient follow-up with hemodialysis.   FINAL CLINICAL IMPRESSION(S) / ED DIAGNOSES   Final diagnoses:  Nonspecific chest pain  ESRD on hemodialysis (HCC)  Morbid obesity (HCC)     Rx / DC Orders   ED Discharge Orders     None        Note:  This document was prepared using Dragon voice recognition software and may include unintentional dictation errors.   Viviann Pastor, MD 03/24/24 [phone]

## 2024-03-24 NOTE — ED Notes (Signed)
 Lab at bedside to draw blood.

## 2024-03-24 NOTE — Discharge Instructions (Signed)
 Your lab test today were all reassuring.  Your CT scan also showed that your aneurysm is okay.  Please continue to follow-up with Dr. Marea and your primary care doctor.

## 2024-03-24 NOTE — Progress Notes (Signed)
 Pt receives outpt HD at [facility] on MWF @ 6am chair time. Navigator following to assist with any HD needs.  Suzen Satchel Dialysis Navigator [phone]

## 2024-03-25 ENCOUNTER — Encounter: Payer: Self-pay | Admitting: Nurse Practitioner

## 2024-03-25 ENCOUNTER — Ambulatory Visit (INDEPENDENT_AMBULATORY_CARE_PROVIDER_SITE_OTHER): Payer: Self-pay | Admitting: Nurse Practitioner

## 2024-03-25 VITALS — BP 134/66 | HR 83 | Temp 96.2°F | Resp 16 | Ht 65.0 in | Wt 204.6 lb

## 2024-03-25 DIAGNOSIS — I714 Abdominal aortic aneurysm, without rupture, unspecified: Secondary | ICD-10-CM

## 2024-03-25 DIAGNOSIS — Z992 Dependence on renal dialysis: Secondary | ICD-10-CM

## 2024-03-25 DIAGNOSIS — I5022 Chronic systolic (congestive) heart failure: Secondary | ICD-10-CM | POA: Insufficient documentation

## 2024-03-25 DIAGNOSIS — F331 Major depressive disorder, recurrent, moderate: Secondary | ICD-10-CM

## 2024-03-25 DIAGNOSIS — N186 End stage renal disease: Secondary | ICD-10-CM

## 2024-03-25 DIAGNOSIS — G40909 Epilepsy, unspecified, not intractable, without status epilepticus: Secondary | ICD-10-CM | POA: Insufficient documentation

## 2024-03-25 DIAGNOSIS — I480 Paroxysmal atrial fibrillation: Secondary | ICD-10-CM | POA: Diagnosis not present

## 2024-03-25 DIAGNOSIS — E782 Mixed hyperlipidemia: Secondary | ICD-10-CM | POA: Insufficient documentation

## 2024-03-25 DIAGNOSIS — F321 Major depressive disorder, single episode, moderate: Secondary | ICD-10-CM | POA: Insufficient documentation

## 2024-03-25 DIAGNOSIS — K219 Gastro-esophageal reflux disease without esophagitis: Secondary | ICD-10-CM | POA: Insufficient documentation

## 2024-03-25 DIAGNOSIS — J410 Simple chronic bronchitis: Secondary | ICD-10-CM | POA: Insufficient documentation

## 2024-03-25 DIAGNOSIS — Z951 Presence of aortocoronary bypass graft: Secondary | ICD-10-CM

## 2024-03-25 DIAGNOSIS — I770 Arteriovenous fistula, acquired: Secondary | ICD-10-CM | POA: Insufficient documentation

## 2024-03-25 DIAGNOSIS — R251 Tremor, unspecified: Secondary | ICD-10-CM

## 2024-03-25 DIAGNOSIS — R1114 Bilious vomiting: Secondary | ICD-10-CM | POA: Insufficient documentation

## 2024-03-25 DIAGNOSIS — Z711 Person with feared health complaint in whom no diagnosis is made: Secondary | ICD-10-CM

## 2024-03-25 DIAGNOSIS — M25511 Pain in right shoulder: Secondary | ICD-10-CM | POA: Insufficient documentation

## 2024-03-25 DIAGNOSIS — J449 Chronic obstructive pulmonary disease, unspecified: Secondary | ICD-10-CM | POA: Diagnosis not present

## 2024-03-25 DIAGNOSIS — D631 Anemia in chronic kidney disease: Secondary | ICD-10-CM

## 2024-03-25 DIAGNOSIS — N2581 Secondary hyperparathyroidism of renal origin: Secondary | ICD-10-CM

## 2024-03-25 MED ORDER — OMEPRAZOLE 40 MG PO CPDR: 40.0000 mg | DELAYED_RELEASE_CAPSULE | Freq: Every day | ORAL | 3 refills | Status: AC

## 2024-03-25 MED ORDER — SYMBICORT 160-4.5 MCG/ACT IN AERO: 2.0000 | INHALATION_SPRAY | Freq: Two times a day (BID) | RESPIRATORY_TRACT | 3 refills | Status: AC

## 2024-03-25 MED ORDER — GABAPENTIN 100 MG PO CAPS: 100.0000 mg | ORAL_CAPSULE | Freq: Three times a day (TID) | ORAL | 5 refills | Status: DC

## 2024-03-25 MED ORDER — ROPINIROLE HCL 0.25 MG PO TABS: 0.2500 mg | ORAL_TABLET | Freq: Three times a day (TID) | ORAL | 5 refills | Status: AC

## 2024-03-25 MED ORDER — ATORVASTATIN CALCIUM 20 MG PO TABS: 20.0000 mg | ORAL_TABLET | Freq: Every day | ORAL | 3 refills | Status: AC

## 2024-03-25 MED ORDER — METOPROLOL SUCCINATE ER 50 MG PO TB24: 50.0000 mg | ORAL_TABLET | Freq: Every day | ORAL | 1 refills | Status: AC

## 2024-03-25 MED ORDER — ONDANSETRON 4 MG PO TBDP: 4.0000 mg | ORAL_TABLET | Freq: Three times a day (TID) | ORAL | 5 refills | Status: AC | PRN

## 2024-03-25 MED ORDER — FUROSEMIDE 40 MG PO TABS: 40.0000 mg | ORAL_TABLET | Freq: Every day | ORAL | 5 refills | Status: AC | PRN

## 2024-03-25 NOTE — Progress Notes (Signed)
 [facility] [address] [place], [place] [address]  Internal MEDICINE  Office Visit Note  Patient Name: Michelle Murillo  [id number]  [id number]  Date of Service: 03/25/2024   Complaints/HPI Pt is here for establishment of PCP. Chief Complaint  Patient presents with   New Patient (Initial Visit)    Right arm sore, anxiety, shaking    HPI Rovena presents for a new patient visit to establish care.  Well-appearing 78 y.o. female with AAA, CHF, ESRD on hemodialysis, COPD, angina pectoris, hypertension, depression, secondary hyperparathyroidism, macular degeneration and dry eye. Work: retired  Home: lives at home alone  Diet:fair  Exercise: not regularly  Tobacco use: smokes 1/2 of 1 cigarette about 9 times per day  Alcohol  use: none  Illicit drug use: none  DEXA scan: done in February this year, osteopenia of left femur neck and osteoporosis of left forearm. Labs: has regular labs drawn with dialysis  New or worsening pain: right shoulder Right shoulder pain -- onset was 2.5 months ago. CT scan of right shoulder showed moderate acromioclavicular joint osteoarthritis and mild glenohumeral joint osteoarthritis. She has not seen any orthopedic specialist yet. CHF with history of CABGx4 in 2013.  History of multiple falls  Having nausea and vomiting intermittently which is attributed to dialysis.  Tremors of both hands and her head. Reports that is happens sporadically. She is already on ropinirole for a lazy leg she also has some degree of restless legs. Considering stopping dialysis and stated during her visit today I know I am going to die She had her son leave the room to discuss this. She reports that she understands that she has significantly complex and chronic medical conditions. She reports that the cardiologist is not sure how much longer her heart will last since 2 out of the 4 bypass grafts are no longer patent. She was also told that she cannot have any  more surgeries due to how severe her heart failure is. She states that she is not ready to stop dialysis yet and not ready for hospice yet.  She is also legally blind due to macular degeneration and dry eye syndrome.    Current Medication: Outpatient Encounter Medications as of 03/25/2024  Medication Sig   isosorbide mononitrate (IMDUR) 30 MG 24 hr tablet Take 30 mg by mouth daily.   ondansetron  (ZOFRAN -ODT) 4 MG disintegrating tablet Take 1 tablet (4 mg total) by mouth every 8 (eight) hours as needed for nausea or vomiting. Also take 1 tablet before dialysis on dialysis days.   [DISCONTINUED] metoprolol succinate (TOPROL-XL) 50 MG 24 hr tablet Take 50 mg by mouth daily.   [DISCONTINUED] rOPINIRole (REQUIP) 0.25 MG tablet Take 0.25 mg by mouth 3 (three) times daily.   albuterol  (VENTOLIN  HFA) 108 (90 Base) MCG/ACT inhaler Inhale 2 puffs into the lungs every 6 (six) hours as needed for wheezing or shortness of breath.   allopurinol (ZYLOPRIM) 300 MG tablet Take 300 mg by mouth daily.    aspirin  EC 81 MG tablet Take 81 mg by mouth daily.   atorvastatin (LIPITOR) 20 MG tablet Take 1 tablet (20 mg total) by mouth daily.   calcitRIOL (ROCALTROL) 0.25 MCG capsule Take 0.25 mcg by mouth daily.   calcium acetate (PHOSLO) 667 MG tablet Take 1,334 mg by mouth 3 (three) times daily with meals.   furosemide (LASIX) 40 MG tablet Take 1 tablet (40 mg total) by mouth daily as needed (for fluid retention (swollen ankles)---typically no more than twice a  week). Takes on T, Th, Sat, Sun only if Diastolic is 60 or above   gabapentin (NEURONTIN) 100 MG capsule Take 1 capsule (100 mg total) by mouth 3 (three) times daily.   hydrOXYzine (ATARAX) 50 MG tablet Take 50 mg by mouth 2 (two) times daily.   metoprolol succinate (TOPROL-XL) 50 MG 24 hr tablet Take 1 tablet (50 mg total) by mouth daily.   omeprazole (PRILOSEC) 40 MG capsule Take 1 capsule (40 mg total) by mouth daily.   rOPINIRole (REQUIP) 0.25 MG tablet  Take 1 tablet (0.25 mg total) by mouth 3 (three) times daily.   SYMBICORT 160-4.5 MCG/ACT inhaler Inhale 2 puffs into the lungs in the morning and at bedtime.   [DISCONTINUED] atorvastatin (LIPITOR) 20 MG tablet Take 20 mg by mouth daily.   [DISCONTINUED] furosemide (LASIX) 40 MG tablet Take 40 mg by mouth daily as needed (for fluid retention (swollen ankles)---typically no more than twice a week). Takes on T, Th, Sat, Sun only if Diastolic is 60 or above   [DISCONTINUED] gabapentin (NEURONTIN) 100 MG capsule Take 100 mg by mouth 3 (three) times daily.   [DISCONTINUED] metoprolol tartrate (LOPRESSOR) 50 MG tablet Take 50 mg by mouth 2 (two) times daily.   [DISCONTINUED] omeprazole (PRILOSEC) 40 MG capsule Take 1 capsule by mouth daily.   [DISCONTINUED] pantoprazole (PROTONIX) 40 MG tablet Take 40 mg by mouth 2 (two) times daily.   [DISCONTINUED] SYMBICORT 160-4.5 MCG/ACT inhaler Inhale 2 puffs into the lungs.   No facility-administered encounter medications on file as of 03/25/2024.    Surgical History: Past Surgical History:  Procedure Laterality Date   A/V FISTULAGRAM Left 01/13/2023   Procedure: A/V Fistulagram;  Surgeon: Marea Selinda RAMAN, MD;  Location: [facility];  Service: Cardiovascular;  Laterality: Left;   ABDOMINAL AORTOGRAM N/A 06/23/2017   Procedure: ABDOMINAL AORTOGRAM;  Surgeon: Marea Selinda RAMAN, MD;  Location: [facility];  Service: Cardiovascular;  Laterality: N/A;   ABDOMINAL SURGERY  2017   aneurysm repair with dr. dew   AV FISTULA PLACEMENT Left 11/07/2022   Procedure: ARTERIOVENOUS (AV) FISTULA CREATION (BRACHIALCEPHALIC);  Surgeon: Marea Selinda RAMAN, MD;  Location: [facility];  Service: Vascular;  Laterality: Left;   BRONCHOSCOPY  2015   done at [facility]. carcinoid of airways   CHOLECYSTECTOMY     age 12   CORONARY ARTERY BYPASS GRAFT  06/12/2011   DIALYSIS/PERMA CATHETER INSERTION N/A 06/24/2022   Procedure: DIALYSIS/PERMA CATHETER INSERTION;  Surgeon: Marea Selinda RAMAN,  MD;  Location: [facility];  Service: Cardiovascular;  Laterality: N/A;   DIALYSIS/PERMA CATHETER INSERTION N/A 09/03/2022   Procedure: DIALYSIS/PERMA CATHETER INSERTION;  Surgeon: Jama Cordella MATSU, MD;  Location: [facility];  Service: Cardiovascular;  Laterality: N/A;   DIALYSIS/PERMA CATHETER INSERTION N/A 10/16/2022   Procedure: DIALYSIS/PERMA CATHETER INSERTION;  Surgeon: Jama Cordella MATSU, MD;  Location: [facility];  Service: Cardiovascular;  Laterality: N/A;   DIALYSIS/PERMA CATHETER INSERTION N/A 10/03/2022   Procedure: DIALYSIS/PERMA CATHETER INSERTION;  Surgeon: Marea Selinda RAMAN, MD;  Location: [facility];  Service: Cardiovascular;  Laterality: N/A;   DIALYSIS/PERMA CATHETER REMOVAL N/A 09/01/2023   Procedure: DIALYSIS/PERMA CATHETER REMOVAL;  Surgeon: Marea Selinda RAMAN, MD;  Location: [facility];  Service: Cardiovascular;  Laterality: N/A;   EMBOLIZATION (CATH LAB) Left 07/23/2023   Procedure: EMBOLIZATION;  Surgeon: Marea Selinda RAMAN, MD;  Location: [facility];  Service: Cardiovascular;  Laterality: Left;  Left Renal Embolization  ENDOVASCULAR STENT GRAFT (AAA) N/A 07/06/2018   Procedure: ENDOVASCULAR REPAIR/STENT GRAFT;  Surgeon: Marea Selinda RAMAN, MD;  Location: [facility];  Service: Cardiovascular;  Laterality: N/A;   laryngeal nodule surgery     LEFT HEART CATH AND CORS/GRAFTS ANGIOGRAPHY Left 03/07/2020   Procedure: LEFT HEART CATH AND CORS/GRAFTS ANGIOGRAPHY;  Surgeon: Hester Wolm PARAS, MD;  Location: [facility];  Service: Cardiovascular;  Laterality: Left;   OOPHORECTOMY Right     Medical History: Past Medical History:  Diagnosis Date   Abdominal aneurysm    a.) s/p EVAR 06/2018   Cancer [facility])    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  pt was in a coma for 18 days  after this.(during CABG)   COPD (chronic obstructive pulmonary disease) (HCC)    Coronary artery disease    a.) s/p 4v CABG 06/12/2011; b.) LHC 03/07/2020: 10% dRCA, 75% p-mRCA, 75% pLCx, 50% p-mLAD, 45% mLAD; SVG-RCA and SVG-OM occluded   Diabetes mellitus without complication (HCC)    borderline, does not take any medication   Dyspnea    ESRD on hemodialysis (HCC) 2019   a.) M-W-F   GERD (gastroesophageal reflux disease)    Gout    Hypertension    Legally blind    unable to see any more than shapes   Right ovarian cyst    S/P CABG x 4 06/12/2011   a.) LIMA-LAD, SVG-OM1, SVG-LCx, SVG-RCA --> procedure complicated by bradycardic arrest and respiratory failure (pulmonary edema) resulting in prolonged ICU admission. Developed rapid A.fib and required DCCV x 3 durrsing ICU admission.   Seizures (HCC) 2013   only happened during cabg procedure while on the table   Sleep apnea    a.) does not use noctural PAP therapy    Family History: Family History  Problem Relation Age of Onset   Congestive Heart Failure Mother    Heart attack Mother    Cancer Father    Liver cancer Father    Cancer Paternal Grandfather     Social History   Socioeconomic History   Marital status: Widowed    Spouse name: Not on file   Number of children: 2   Years of education: Not on file   Highest education level: Not on file  Occupational History   Not on file  Tobacco Use   Smoking status: Every Day    Current packs/day: 0.50    Types: Cigarettes   Smokeless tobacco: Never   Tobacco comments:    6-7 cigs per day  Vaping Use   Vaping status: Never Used  Substance and Sexual Activity   Alcohol  use: No   Drug use: No   Sexual activity: Not Currently  Other Topics Concern   Not on file  Social History Narrative   2 sons: 10 & 58 y.o. : lives by herself    Social Drivers of Corporate Investment Banker Strain: Not on file  Food Insecurity: No Food Insecurity (07/23/2023)   Hunger Vital Sign     Worried About Running Out of Food in the Last Year: Never true    Ran Out of Food in the Last Year: Never true  Transportation Needs: No Transportation Needs (07/23/2023)   PRAPARE - Administrator, Civil Service (Medical): No    Lack of Transportation (Non-Medical): No  Physical Activity: Not on file  Stress: Not on file  Social Connections: Moderately Integrated (07/23/2023)   Social Connection and Isolation Panel    Frequency of Communication with Friends and Family: Three times a week    Frequency of Social Gatherings with Friends and Family: Twice a week    Attends Religious Services: More than 4 times per year    Active Member of [employer] or Organizations: Yes    Attends Banker Meetings: 1 to 4 times per year    Marital Status: Widowed  Intimate Partner Violence: Not At Risk (07/23/2023)   Humiliation, Afraid, Rape, and Kick questionnaire    Fear of Current or Ex-Partner: No    Emotionally Abused: No    Physically Abused: No    Sexually Abused: No     Review of Systems  Constitutional:  Positive for activity change and fatigue.  Respiratory:  Positive for shortness of breath. Negative for cough, chest tightness and wheezing.   Cardiovascular:  Positive for leg swelling. Negative for chest pain and palpitations.  Gastrointestinal:  Positive for nausea and vomiting. Negative for abdominal pain, constipation and diarrhea.  Genitourinary:  Positive for decreased urine volume.  Musculoskeletal:  Positive for arthralgias and back pain.  Skin: Negative.   Neurological:  Positive for weakness.  Psychiatric/Behavioral:  Positive for behavioral problems and sleep disturbance. Negative for self-injury and suicidal ideas. The patient is not nervous/anxious.     Vital Signs: BP 134/66   Pulse 83   Temp (!) 96.2 F (35.7 C)   Resp 16   Ht 5' 5 (1.651 m)   Wt 204 lb 9.6 oz (92.8 kg)   SpO2 97%   BMI 34.05 kg/m    Physical Exam Vitals reviewed.   Constitutional:      General: She is not in acute distress.    Appearance: Normal appearance. She is obese. She is not ill-appearing.  HENT:     Head: Normocephalic and atraumatic.  Eyes:     Pupils: Pupils are equal, round, and reactive to light.  Cardiovascular:     Rate and Rhythm: Normal rate and regular rhythm.  Pulmonary:     Effort: Pulmonary effort is normal. No respiratory distress.  Musculoskeletal:     Right lower leg: Edema present.     Left lower leg: Edema present.  Skin:    General: Skin is warm and dry.     Capillary Refill: Capillary refill takes 2 to 3 seconds.  Neurological:     Mental Status: She is alert and oriented to person, place, and time.     Motor: Weakness present.  Psychiatric:        Mood and Affect: Mood normal.        Behavior: Behavior normal.       Assessment/Plan: 1. Chronic systolic heart failure (HCC) (Primary) Referred to cardiology Refer to palliative care for advance care planning, care coordination and symptoms management Continue medications as prescribed  - Ambulatory referral to Cardiology - isosorbide mononitrate (IMDUR) 30 MG 24 hr tablet; Take 30 mg by mouth daily. - furosemide (LASIX) 40 MG tablet; Take 1 tablet (40 mg total) by mouth daily as needed (for fluid retention (swollen ankles)---typically no more than twice a week). Takes on T, Th, Sat, Sun only if Diastolic is 60 or above  Dispense: 30 tablet; Refill: 5 - metoprolol succinate (TOPROL-XL) 50 MG 24 hr tablet; Take 1 tablet (50 mg total) by mouth daily.  Dispense: 90 tablet; Refill: 1 -  Amb Referral to Palliative Care  2. Anemia in chronic kidney disease, on chronic dialysis [facility]) Refer to palliative care for advance care planning, care coordination and symptoms management - Amb Referral to Palliative Care  3. Chronic obstructive pulmonary disease, unspecified COPD type (HCC) Continue symbicort as prescribed  - SYMBICORT 160-4.5 MCG/ACT inhaler; Inhale 2 puffs  into the lungs in the morning and at bedtime.  Dispense: 3 each; Refill: 3  4. Paroxysmal atrial fibrillation (HCC) Referred to cardiology  - Ambulatory referral to Cardiology  5. Secondary hyperparathyroidism of renal origin Continue phoslo as prescribed by nephrology - calcium acetate (PHOSLO) 667 MG tablet; Take 1,334 mg by mouth 3 (three) times daily with meals.  6. Abdominal aortic aneurysm (AAA) without rupture, unspecified part Referred to cardiology - Ambulatory referral to Cardiology  7. Acute pain of right shoulder Referred to [facility] with Dr. Marchia - Ambulatory referral to Orthopedic Surgery  8. Tremor of both hands Continue ropinirole 3 times daily as needed, this is increased from just at bedtime - hydrOXYzine (ATARAX) 50 MG tablet; Take 50 mg by mouth 2 (two) times daily. - gabapentin (NEURONTIN) 100 MG capsule; Take 1 capsule (100 mg total) by mouth 3 (three) times daily.  Dispense: 90 capsule; Refill: 5 - rOPINIRole (REQUIP) 0.25 MG tablet; Take 1 tablet (0.25 mg total) by mouth 3 (three) times daily.  Dispense: 90 tablet; Refill: 5  9. Bilious vomiting with nausea Referred to palliative care, and take zofran  as needed  - ondansetron  (ZOFRAN -ODT) 4 MG disintegrating tablet; Take 1 tablet (4 mg total) by mouth every 8 (eight) hours as needed for nausea or vomiting. Also take 1 tablet before dialysis on dialysis days.  Dispense: 90 tablet; Refill: 5 - Amb Referral to Palliative Care  10. S/P CABG (coronary artery bypass graft) Refer to palliative care for advance care planning, care coordination and symptoms management. Referred to cardiology and continue atorvastatin as prescribed.  - Ambulatory referral to Cardiology - atorvastatin (LIPITOR) 20 MG tablet; Take 1 tablet (20 mg total) by mouth daily.  Dispense: 90 tablet; Refill: 3 - Amb Referral to Palliative Care  11. Moderate episode of recurrent major depressive disorder (HCC) Refer to palliative care for  advance care planning, care coordination and symptoms management - Amb Referral to Palliative Care  12. Concern about end of life Refer to palliative care for advance care planning, care coordination and symptoms management - Amb Referral to Palliative Care  13. Gastroesophageal reflux disease without esophagitis Continue omeprazole as prescribed - omeprazole (PRILOSEC) 40 MG capsule; Take 1 capsule (40 mg total) by mouth daily.  Dispense: 90 capsule; Refill: 3    General Counseling: Imo verbalizes understanding of the findings of todays visit and agrees with plan of treatment. I have discussed any further diagnostic evaluation that may be needed or ordered today. We also reviewed her medications today. she has been encouraged to call the office with any questions or concerns that should arise related to todays visit.    Orders Placed This Encounter  Procedures   Ambulatory referral to Orthopedic Surgery   Ambulatory referral to Cardiology    Meds ordered this encounter  Medications   ondansetron  (ZOFRAN -ODT) 4 MG disintegrating tablet    Sig: Take 1 tablet (4 mg total) by mouth every 8 (eight) hours as needed for nausea or vomiting. Also take 1 tablet before dialysis on dialysis days.    Dispense:  90 tablet    Refill:  5    Fill new  script today   atorvastatin (LIPITOR) 20 MG tablet    Sig: Take 1 tablet (20 mg total) by mouth daily.    Dispense:  90 tablet    Refill:  3   furosemide (LASIX) 40 MG tablet    Sig: Take 1 tablet (40 mg total) by mouth daily as needed (for fluid retention (swollen ankles)---typically no more than twice a week). Takes on T, Th, Sat, Sun only if Diastolic is 60 or above    Dispense:  30 tablet    Refill:  5   gabapentin (NEURONTIN) 100 MG capsule    Sig: Take 1 capsule (100 mg total) by mouth 3 (three) times daily.    Dispense:  90 capsule    Refill:  5   metoprolol succinate (TOPROL-XL) 50 MG 24 hr tablet    Sig: Take 1 tablet (50 mg total)  by mouth daily.    Dispense:  90 tablet    Refill:  1   omeprazole (PRILOSEC) 40 MG capsule    Sig: Take 1 capsule (40 mg total) by mouth daily.    Dispense:  90 capsule    Refill:  3   SYMBICORT 160-4.5 MCG/ACT inhaler    Sig: Inhale 2 puffs into the lungs in the morning and at bedtime.    Dispense:  3 each    Refill:  3   rOPINIRole (REQUIP) 0.25 MG tablet    Sig: Take 1 tablet (0.25 mg total) by mouth 3 (three) times daily.    Dispense:  90 tablet    Refill:  5    Return in about 4 weeks (around 04/22/2024) for F/U, eval new med, Deshan Hemmelgarn PCP.  Time spent:60 Minutes Time spent with patient included reviewing progress notes, labs, imaging studies, and discussing plan for follow up.   [place] Controlled Substance Database was reviewed by me for overdose risk score (ORS)   This patient was seen by Mardy Maxin, FNP-C in collaboration with Dr. Sigrid Bathe as a part of collaborative care agreement.   Samay Delcarlo R. Maxin, MSN, FNP-C Internal Medicine

## 2024-03-26 ENCOUNTER — Telehealth: Payer: Self-pay | Admitting: Nurse Practitioner

## 2024-03-26 NOTE — Telephone Encounter (Addendum)
 Urgent Cardiology referral faxed to [facility] in [place]; F) [phone]. P) [phone].  Urgent Orthopedic referral sent via [facility] to [facility]. P) [phone].  Information given to patient-Toni

## 2024-03-30 ENCOUNTER — Telehealth: Payer: Self-pay

## 2024-03-30 NOTE — Telephone Encounter (Signed)
 [facility] will be following patient for palliative services. If you have any questions, you can contact [phone].

## 2024-04-06 ENCOUNTER — Telehealth: Payer: Self-pay | Admitting: Nurse Practitioner

## 2024-04-06 NOTE — Telephone Encounter (Signed)
 May be son can come at time of visit

## 2024-04-07 ENCOUNTER — Telehealth: Payer: Self-pay | Admitting: Nurse Practitioner

## 2024-04-07 NOTE — Telephone Encounter (Signed)
 Per Niels reinhold Gaba, referral has been closed due to patient not returning calls -Andree

## 2024-04-08 NOTE — Telephone Encounter (Signed)
 Ok thank you

## 2024-04-21 ENCOUNTER — Ambulatory Visit: Admitting: Nurse Practitioner

## 2024-04-21 ENCOUNTER — Encounter: Payer: Self-pay | Admitting: Nurse Practitioner

## 2024-04-21 ENCOUNTER — Ambulatory Visit (INDEPENDENT_AMBULATORY_CARE_PROVIDER_SITE_OTHER): Admitting: Nurse Practitioner

## 2024-04-21 VITALS — BP 110/73 | HR 76 | Temp 97.7°F | Resp 16 | Ht 65.0 in | Wt 197.8 lb

## 2024-04-21 DIAGNOSIS — M79671 Pain in right foot: Secondary | ICD-10-CM

## 2024-04-21 DIAGNOSIS — R296 Repeated falls: Secondary | ICD-10-CM | POA: Insufficient documentation

## 2024-04-21 DIAGNOSIS — G3184 Mild cognitive impairment, so stated: Secondary | ICD-10-CM | POA: Insufficient documentation

## 2024-04-21 DIAGNOSIS — Z818 Family history of other mental and behavioral disorders: Secondary | ICD-10-CM | POA: Insufficient documentation

## 2024-04-21 DIAGNOSIS — M2061 Acquired deformities of toe(s), unspecified, right foot: Secondary | ICD-10-CM

## 2024-04-21 NOTE — Progress Notes (Signed)
 [facility] [address] [place], [place] [address]  Internal MEDICINE  Office Visit Note  Patient Name: Michelle Murillo  [id number]  [id number]  Date of Service: 04/21/2024  Chief Complaint  Patient presents with   Diabetes   Gastroesophageal Reflux   Hypertension   Follow-up    HPI Michelle Murillo presents for a follow-up visit for falls, impaired cognition, impaired memory,  History of multiple falls, does not remember these falls.  Decreased cognition and memory and patient reports a family history of dementia on her mother's side.  Head trauma from a glass bowl falling and breaking on her head recently.  Seen by the cardiologist and will have a chemical cardiac stress test on 05/07/24. Echo was normal.  Issues with toes on right foot curling in and being contracted, some of the toes are rubbing on the stop of her shoe, wants to be seen by podiatry.    Current Medication: Outpatient Encounter Medications as of 04/21/2024  Medication Sig   albuterol  (VENTOLIN  HFA) 108 (90 Base) MCG/ACT inhaler Inhale 2 puffs into the lungs every 6 (six) hours as needed for wheezing or shortness of breath.   allopurinol (ZYLOPRIM) 300 MG tablet Take 300 mg by mouth daily.    aspirin  EC 81 MG tablet Take 81 mg by mouth daily.   atorvastatin  (LIPITOR) 20 MG tablet Take 1 tablet (20 mg total) by mouth daily.   calcitRIOL (ROCALTROL) 0.25 MCG capsule Take 0.25 mcg by mouth daily.   calcium  acetate (PHOSLO) 667 MG tablet Take 1,334 mg by mouth 3 (three) times daily with meals.   furosemide  (LASIX ) 40 MG tablet Take 1 tablet (40 mg total) by mouth daily as needed (for fluid retention (swollen ankles)---typically no more than twice a week). Takes on T, Th, Sat, Sun only if Diastolic is 60 or above   gabapentin  (NEURONTIN ) 100 MG capsule Take 1 capsule (100 mg total) by mouth 3 (three) times daily.   hydrOXYzine (ATARAX) 50 MG tablet Take 50 mg by mouth 2 (two) times daily.   isosorbide  mononitrate (IMDUR) 30 MG 24 hr tablet Take 30 mg by mouth daily.   metoprolol  succinate (TOPROL -XL) 50 MG 24 hr tablet Take 1 tablet (50 mg total) by mouth daily.   omeprazole  (PRILOSEC) 40 MG capsule Take 1 capsule (40 mg total) by mouth daily.   ondansetron  (ZOFRAN -ODT) 4 MG disintegrating tablet Take 1 tablet (4 mg total) by mouth every 8 (eight) hours as needed for nausea or vomiting. Also take 1 tablet before dialysis on dialysis days.   rOPINIRole  (REQUIP ) 0.25 MG tablet Take 1 tablet (0.25 mg total) by mouth 3 (three) times daily.   SYMBICORT  160-4.5 MCG/ACT inhaler Inhale 2 puffs into the lungs in the morning and at bedtime.   No facility-administered encounter medications on file as of 04/21/2024.    Surgical History: Past Surgical History:  Procedure Laterality Date   A/V FISTULAGRAM Left 01/13/2023   Procedure: A/V Fistulagram;  Surgeon: Marea Selinda RAMAN, MD;  Location: [facility];  Service: Cardiovascular;  Laterality: Left;   ABDOMINAL AORTOGRAM N/A 06/23/2017   Procedure: ABDOMINAL AORTOGRAM;  Surgeon: Marea Selinda RAMAN, MD;  Location: [facility];  Service: Cardiovascular;  Laterality: N/A;   ABDOMINAL SURGERY  2017   aneurysm repair with dr. dew   AV FISTULA PLACEMENT Left 11/07/2022   Procedure: ARTERIOVENOUS (AV) FISTULA CREATION (BRACHIALCEPHALIC);  Surgeon: Marea Selinda RAMAN, MD;  Location: [facility];  Service: Vascular;  Laterality: Left;  BRONCHOSCOPY  2015   done at [facility]. carcinoid of airways   CHOLECYSTECTOMY     age 78   CORONARY ARTERY BYPASS GRAFT  06/12/2011   DIALYSIS/PERMA CATHETER INSERTION N/A 06/24/2022   Procedure: DIALYSIS/PERMA CATHETER INSERTION;  Surgeon: Marea Selinda RAMAN, MD;  Location: [facility];  Service: Cardiovascular;  Laterality: N/A;   DIALYSIS/PERMA CATHETER INSERTION N/A 09/03/2022   Procedure: DIALYSIS/PERMA CATHETER INSERTION;  Surgeon: Jama Cordella MATSU, MD;  Location: [facility];  Service: Cardiovascular;   Laterality: N/A;   DIALYSIS/PERMA CATHETER INSERTION N/A 10/16/2022   Procedure: DIALYSIS/PERMA CATHETER INSERTION;  Surgeon: Jama Cordella MATSU, MD;  Location: [facility];  Service: Cardiovascular;  Laterality: N/A;   DIALYSIS/PERMA CATHETER INSERTION N/A 10/03/2022   Procedure: DIALYSIS/PERMA CATHETER INSERTION;  Surgeon: Marea Selinda RAMAN, MD;  Location: [facility];  Service: Cardiovascular;  Laterality: N/A;   DIALYSIS/PERMA CATHETER REMOVAL N/A 09/01/2023   Procedure: DIALYSIS/PERMA CATHETER REMOVAL;  Surgeon: Marea Selinda RAMAN, MD;  Location: [facility];  Service: Cardiovascular;  Laterality: N/A;   EMBOLIZATION (CATH LAB) Left 07/23/2023   Procedure: EMBOLIZATION;  Surgeon: Marea Selinda RAMAN, MD;  Location: [facility];  Service: Cardiovascular;  Laterality: Left;  Left Renal Embolization   ENDOVASCULAR STENT GRAFT (AAA) N/A 07/06/2018   Procedure: ENDOVASCULAR REPAIR/STENT GRAFT;  Surgeon: Marea Selinda RAMAN, MD;  Location: [facility];  Service: Cardiovascular;  Laterality: N/A;   laryngeal nodule surgery     LEFT HEART CATH AND CORS/GRAFTS ANGIOGRAPHY Left 03/07/2020   Procedure: LEFT HEART CATH AND CORS/GRAFTS ANGIOGRAPHY;  Surgeon: Hester Wolm PARAS, MD;  Location: [facility];  Service: Cardiovascular;  Laterality: Left;   OOPHORECTOMY Right     Medical History: Past Medical History:  Diagnosis Date   Abdominal aneurysm    a.) s/p EVAR 06/2018   Cancer [facility])    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  pt was in a coma for 18 days after this.(during CABG)   COPD (chronic obstructive pulmonary disease) (HCC)    Coronary artery disease    a.) s/p 4v CABG 06/12/2011; b.) LHC 03/07/2020: 10% dRCA, 75% p-mRCA, 75% pLCx, 50% p-mLAD, 45% mLAD; SVG-RCA and SVG-OM occluded   Diabetes mellitus without  complication (HCC)    borderline, does not take any medication   Dyspnea    ESRD on hemodialysis (HCC) 2019   a.) M-W-F   GERD (gastroesophageal reflux disease)    Gout    Hypertension    Legally blind    unable to see any more than shapes   Right ovarian cyst    S/P CABG x 4 06/12/2011   a.) LIMA-LAD, SVG-OM1, SVG-LCx, SVG-RCA --> procedure complicated by bradycardic arrest and respiratory failure (pulmonary edema) resulting in prolonged ICU admission. Developed rapid A.fib and required DCCV x 3 durrsing ICU admission.   Seizures (HCC) 2013   only happened during cabg procedure while on the table   Sleep apnea    a.) does not use noctural PAP therapy    Family History: Family History  Problem Relation Age of Onset   Congestive Heart Failure Mother    Heart attack Mother    Cancer Father    Liver cancer Father    Cancer Paternal Grandfather     Social History   Socioeconomic  History   Marital status: Widowed    Spouse name: Not on file   Number of children: 2   Years of education: Not on file   Highest education level: Not on file  Occupational History   Not on file  Tobacco Use   Smoking status: Every Day    Current packs/day: 0.50    Types: Cigarettes   Smokeless tobacco: Never   Tobacco comments:    6-7 cigs per day  Vaping Use   Vaping status: Never Used  Substance and Sexual Activity   Alcohol  use: No   Drug use: No   Sexual activity: Not Currently  Other Topics Concern   Not on file  Social History Narrative   2 sons: 59 & 19 y.o. : lives by herself    Social Drivers of Corporate Investment Banker Strain: Not on file  Food Insecurity: No Food Insecurity (07/23/2023)   Hunger Vital Sign    Worried About Running Out of Food in the Last Year: Never true    Ran Out of Food in the Last Year: Never true  Transportation Needs: No Transportation Needs (07/23/2023)   PRAPARE - Administrator, Civil Service (Medical): No    Lack of  Transportation (Non-Medical): No  Physical Activity: Not on file  Stress: Not on file  Social Connections: Moderately Integrated (07/23/2023)   Social Connection and Isolation Panel    Frequency of Communication with Friends and Family: Three times a week    Frequency of Social Gatherings with Friends and Family: Twice a week    Attends Religious Services: More than 4 times per year    Active Member of [employer] or Organizations: Yes    Attends Banker Meetings: 1 to 4 times per year    Marital Status: Widowed  Intimate Partner Violence: Not At Risk (07/23/2023)   Humiliation, Afraid, Rape, and Kick questionnaire    Fear of Current or Ex-Partner: No    Emotionally Abused: No    Physically Abused: No    Sexually Abused: No      Review of Systems  Constitutional:  Positive for activity change and fatigue. Negative for chills and unexpected weight change.  HENT:  Negative for congestion, postnasal drip, rhinorrhea, sneezing and sore throat.   Eyes:  Negative for redness.  Respiratory:  Positive for shortness of breath. Negative for cough, chest tightness and wheezing.   Cardiovascular: Negative.  Negative for chest pain and palpitations.  Gastrointestinal: Negative.  Negative for abdominal pain, constipation, diarrhea, nausea and vomiting.  Genitourinary:  Negative for dysuria and frequency.  Musculoskeletal:  Positive for arthralgias and gait problem. Negative for back pain, joint swelling and neck pain.  Skin:  Negative for rash.  Neurological:  Positive for dizziness, weakness and light-headedness. Negative for tremors and numbness.  Hematological:  Negative for adenopathy. Does not bruise/bleed easily.  Psychiatric/Behavioral:  Negative for behavioral problems (Depression), sleep disturbance and suicidal ideas. The patient is not nervous/anxious.     Vital Signs: BP 110/73   Pulse 76   Temp 97.7 F (36.5 C)   Resp 16   Ht 5' 5 (1.651 m)   Wt 197 lb 12.8 oz (89.7  kg)   SpO2 92%   BMI 32.92 kg/m    Physical Exam Vitals reviewed.  Constitutional:      General: She is not in acute distress.    Appearance: Normal appearance. She is obese. She is not ill-appearing.  HENT:  Head: Normocephalic and atraumatic.  Eyes:     Pupils: Pupils are equal, round, and reactive to light.  Cardiovascular:     Rate and Rhythm: Normal rate and regular rhythm.     Pulses:          Dorsalis pedis pulses are 2+ on the right side.       Posterior tibial pulses are 2+ on the right side.     Heart sounds: Normal heart sounds. No murmur heard. Pulmonary:     Effort: Pulmonary effort is normal. No respiratory distress.     Breath sounds: Normal breath sounds. No wheezing.  Musculoskeletal:     Right lower leg: Edema present.     Left lower leg: Edema present.     Right foot: Decreased range of motion. Deformity and prominent metatarsal heads present. No bunion, Charcot foot or foot drop.  Feet:     Right foot:     Protective Sensation: 6 sites tested.  6 sites sensed.     Skin integrity: No ulcer, blister, skin breakdown, erythema, warmth, callus, dry skin or fissure.     Toenail Condition: Right toenails are abnormally thick.     Left foot:     Protective Sensation: 6 sites tested.  6 sites sensed.  Skin:    General: Skin is warm and dry.     Capillary Refill: Capillary refill takes 2 to 3 seconds.  Neurological:     Mental Status: She is alert and oriented to person, place, and time.     Motor: Weakness present.  Psychiatric:        Mood and Affect: Mood and affect normal.        Speech: Speech normal.        Behavior: Behavior normal. Behavior is cooperative.        Thought Content: Thought content is not paranoid or delusional. Thought content does not include homicidal or suicidal ideation. Thought content does not include homicidal or suicidal plan.        Cognition and Memory: Cognition is impaired. Memory is impaired.        Judgment: Judgment  normal.        Assessment/Plan: 1. Mild cognitive impairment with memory loss (Primary) Referred to neurology for further evaluation - Ambulatory referral to Neurology  2. Multiple falls Referred to neurology for further evaluation - Ambulatory referral to Neurology  3. Right foot pain Referred to podiatry for further evaluation - Ambulatory referral to Podiatry  4. Deformity of toe of right foot Referred to podiatry for further evaluation.  - Ambulatory referral to Podiatry  5. Maternal family history of dementia Referred to neurology for further evaluation - Ambulatory referral to Neurology   General Counseling: Michelle Murillo understanding of the findings of todays visit and agrees with plan of treatment. I have discussed any further diagnostic evaluation that may be needed or ordered today. We also reviewed her medications today. she has been encouraged to call the office with any questions or concerns that should arise related to todays visit.    Orders Placed This Encounter  Procedures   Ambulatory referral to Neurology   Ambulatory referral to Podiatry    No orders of the defined types were placed in this encounter.   Return for Michelle Murillo PCP in january please .   Total time spent:30 Minutes Time spent includes review of chart, medications, test results, and follow up plan with the patient.   [place] Controlled Substance Database was reviewed by  me.  This patient was seen by Mardy Maxin, FNP-C in collaboration with Dr. Sigrid Bathe as a part of collaborative care agreement.   Symon Norwood R. Maxin, MSN, FNP-C Internal medicine

## 2024-04-26 ENCOUNTER — Telehealth: Payer: Self-pay | Admitting: Nurse Practitioner

## 2024-04-26 NOTE — Telephone Encounter (Signed)
 Awaiting 04/21/24 office notes for Podiatry & Neurology referral-Toni

## 2024-05-04 ENCOUNTER — Encounter: Payer: Self-pay | Admitting: Nurse Practitioner

## 2024-05-04 ENCOUNTER — Ambulatory Visit: Admitting: Nurse Practitioner

## 2024-05-04 VITALS — BP 104/71 | HR 77 | Temp 95.5°F | Resp 16 | Ht 65.0 in | Wt 196.8 lb

## 2024-05-04 DIAGNOSIS — I714 Abdominal aortic aneurysm, without rupture, unspecified: Secondary | ICD-10-CM

## 2024-05-04 DIAGNOSIS — R251 Tremor, unspecified: Secondary | ICD-10-CM

## 2024-05-04 DIAGNOSIS — Z0001 Encounter for general adult medical examination with abnormal findings: Secondary | ICD-10-CM

## 2024-05-04 DIAGNOSIS — D631 Anemia in chronic kidney disease: Secondary | ICD-10-CM

## 2024-05-04 DIAGNOSIS — I5022 Chronic systolic (congestive) heart failure: Secondary | ICD-10-CM

## 2024-05-04 DIAGNOSIS — I480 Paroxysmal atrial fibrillation: Secondary | ICD-10-CM

## 2024-05-04 DIAGNOSIS — N2581 Secondary hyperparathyroidism of renal origin: Secondary | ICD-10-CM

## 2024-05-04 DIAGNOSIS — R296 Repeated falls: Secondary | ICD-10-CM

## 2024-05-04 DIAGNOSIS — N186 End stage renal disease: Secondary | ICD-10-CM

## 2024-05-04 DIAGNOSIS — J449 Chronic obstructive pulmonary disease, unspecified: Secondary | ICD-10-CM

## 2024-05-04 MED ORDER — GABAPENTIN 100 MG PO CAPS: 100.0000 mg | ORAL_CAPSULE | Freq: Three times a day (TID) | ORAL | 3 refills | Status: AC

## 2024-05-04 NOTE — Progress Notes (Unsigned)
 [facility] [address] [place], [place] [address]  Internal MEDICINE  Office Visit Note  Patient Name: Michelle Murillo  [id number]  [id number]  Date of Service: 05/04/2024  Chief Complaint  Patient presents with   Diabetes   Gastroesophageal Reflux   Hypertension   Medicare Wellness    HPI Zachary presents for an annual well visit and physical exam.  Well-appearing 78 y.o. female with AAA, CHF, ESRD on hemodialysis, COPD, angina pectoris, hypertension, depression, secondary hyperparathyroidism, macular degeneration and dry eye.  Eye exam: sees the eye doctor regularly  foot exam: referred to podiatry  Labs: has regular labs done with dialysis New or worsening pain: chronic right shoulder pain Other concerns: recently referred to neurology for memory issues.  Medication list is up to date.  She does need to get a lipid panel drawn and will see if the dialysis      05/04/2024    3:01 PM  MMSE - Mini Mental State Exam  Orientation to time 5  Orientation to Place 0  Registration 3  Attention/ Calculation 5  Recall 3  Language- name 2 objects 2  Language- repeat 1  Language- follow 3 step command 3  Language- read & follow direction 1  Write a sentence 1  Copy design 1  Total score 25    Functional Status Survey: Is the patient deaf or have difficulty hearing?: No Does the patient have difficulty seeing, even when wearing glasses/contacts?: Yes Does the patient have difficulty concentrating, remembering, or making decisions?: Yes Does the patient have difficulty walking or climbing stairs?: Yes Does the patient have difficulty dressing or bathing?: No Does the patient have difficulty doing errands alone such as visiting a doctor's office or shopping?: Yes     04/14/2018    3:54 PM 03/25/2024   11:29 AM 05/04/2024    3:00 PM  Fall Risk  Falls in the past year?  1 1  Was there an injury with Fall?  1  1  Fall Risk Category Calculator  3 3   (RETIRED) Patient Fall Risk Level Low fall risk     Fall risk Follow up  Falls evaluation completed Falls evaluation completed     Data saved with a previous flowsheet row definition        No data to display              No data to display            Current Medication: Outpatient Encounter Medications as of 05/04/2024  Medication Sig   albuterol  (VENTOLIN  HFA) 108 (90 Base) MCG/ACT inhaler Inhale 2 puffs into the lungs every 6 (six) hours as needed for wheezing or shortness of breath.   allopurinol (ZYLOPRIM) 300 MG tablet Take 300 mg by mouth daily.    aspirin  EC 81 MG tablet Take 81 mg by mouth daily.   atorvastatin  (LIPITOR) 20 MG tablet Take 1 tablet (20 mg total) by mouth daily.   calcitRIOL (ROCALTROL) 0.25 MCG capsule Take 0.25 mcg by mouth daily.   calcium  acetate (PHOSLO) 667 MG tablet Take 1,334 mg by mouth 3 (three) times daily with meals.   furosemide  (LASIX ) 40 MG tablet Take 1 tablet (40 mg total) by mouth daily as needed (for fluid retention (swollen ankles)---typically no more than twice a week). Takes on T, Th, Sat, Sun only if Diastolic is 60 or above   gabapentin  (NEURONTIN ) 100 MG capsule Take 1 capsule (100 mg total) by mouth 3 (  three) times daily.   hydrOXYzine (ATARAX) 50 MG tablet Take 50 mg by mouth 2 (two) times daily.   isosorbide mononitrate (IMDUR) 30 MG 24 hr tablet Take 30 mg by mouth daily.   metoprolol  succinate (TOPROL -XL) 50 MG 24 hr tablet Take 1 tablet (50 mg total) by mouth daily.   omeprazole  (PRILOSEC) 40 MG capsule Take 1 capsule (40 mg total) by mouth daily.   ondansetron  (ZOFRAN -ODT) 4 MG disintegrating tablet Take 1 tablet (4 mg total) by mouth every 8 (eight) hours as needed for nausea or vomiting. Also take 1 tablet before dialysis on dialysis days.   rOPINIRole  (REQUIP ) 0.25 MG tablet Take 1 tablet (0.25 mg total) by mouth 3 (three) times daily.   SYMBICORT  160-4.5 MCG/ACT inhaler Inhale 2 puffs into the lungs in the morning and  at bedtime.   [DISCONTINUED] gabapentin  (NEURONTIN ) 100 MG capsule Take 1 capsule (100 mg total) by mouth 3 (three) times daily.   No facility-administered encounter medications on file as of 05/04/2024.    Surgical History: Past Surgical History:  Procedure Laterality Date   A/V FISTULAGRAM Left 01/13/2023   Procedure: A/V Fistulagram;  Surgeon: Marea Selinda RAMAN, MD;  Location: [facility];  Service: Cardiovascular;  Laterality: Left;   ABDOMINAL AORTOGRAM N/A 06/23/2017   Procedure: ABDOMINAL AORTOGRAM;  Surgeon: Marea Selinda RAMAN, MD;  Location: [facility];  Service: Cardiovascular;  Laterality: N/A;   ABDOMINAL SURGERY  2017   aneurysm repair with dr. dew   AV FISTULA PLACEMENT Left 11/07/2022   Procedure: ARTERIOVENOUS (AV) FISTULA CREATION (BRACHIALCEPHALIC);  Surgeon: Marea Selinda RAMAN, MD;  Location: [facility];  Service: Vascular;  Laterality: Left;   BRONCHOSCOPY  2015   done at [facility]. carcinoid of airways   CHOLECYSTECTOMY     age 38   CORONARY ARTERY BYPASS GRAFT  06/12/2011   DIALYSIS/PERMA CATHETER INSERTION N/A 06/24/2022   Procedure: DIALYSIS/PERMA CATHETER INSERTION;  Surgeon: Marea Selinda RAMAN, MD;  Location: [facility];  Service: Cardiovascular;  Laterality: N/A;   DIALYSIS/PERMA CATHETER INSERTION N/A 09/03/2022   Procedure: DIALYSIS/PERMA CATHETER INSERTION;  Surgeon: Jama Cordella MATSU, MD;  Location: [facility];  Service: Cardiovascular;  Laterality: N/A;   DIALYSIS/PERMA CATHETER INSERTION N/A 10/16/2022   Procedure: DIALYSIS/PERMA CATHETER INSERTION;  Surgeon: Jama Cordella MATSU, MD;  Location: [facility];  Service: Cardiovascular;  Laterality: N/A;   DIALYSIS/PERMA CATHETER INSERTION N/A 10/03/2022   Procedure: DIALYSIS/PERMA CATHETER INSERTION;  Surgeon: Marea Selinda RAMAN, MD;  Location: [facility];  Service: Cardiovascular;  Laterality: N/A;   DIALYSIS/PERMA CATHETER REMOVAL N/A 09/01/2023   Procedure: DIALYSIS/PERMA CATHETER  REMOVAL;  Surgeon: Marea Selinda RAMAN, MD;  Location: [facility];  Service: Cardiovascular;  Laterality: N/A;   EMBOLIZATION (CATH LAB) Left 07/23/2023   Procedure: EMBOLIZATION;  Surgeon: Marea Selinda RAMAN, MD;  Location: [facility];  Service: Cardiovascular;  Laterality: Left;  Left Renal Embolization   ENDOVASCULAR STENT GRAFT (AAA) N/A 07/06/2018   Procedure: ENDOVASCULAR REPAIR/STENT GRAFT;  Surgeon: Marea Selinda RAMAN, MD;  Location: [facility];  Service: Cardiovascular;  Laterality: N/A;   laryngeal nodule surgery     LEFT HEART CATH AND CORS/GRAFTS ANGIOGRAPHY Left 03/07/2020   Procedure: LEFT HEART CATH AND CORS/GRAFTS ANGIOGRAPHY;  Surgeon: Hester Wolm PARAS, MD;  Location: [facility];  Service: Cardiovascular;  Laterality: Left;   OOPHORECTOMY Right     Medical History: Past Medical History:  Diagnosis Date  Abdominal aneurysm    a.) s/p EVAR 06/2018   Cancer [facility])    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  pt was in a coma for 18 days after this.(during CABG)   COPD (chronic obstructive pulmonary disease) (HCC)    Coronary artery disease    a.) s/p 4v CABG 06/12/2011; b.) LHC 03/07/2020: 10% dRCA, 75% p-mRCA, 75% pLCx, 50% p-mLAD, 45% mLAD; SVG-RCA and SVG-OM occluded   Diabetes mellitus without complication (HCC)    borderline, does not take any medication   Dyspnea    ESRD on hemodialysis (HCC) 2019   a.) M-W-F   GERD (gastroesophageal reflux disease)    Gout    Hypertension    Legally blind    unable to see any more than shapes   Right ovarian cyst    S/P CABG x 4 06/12/2011   a.) LIMA-LAD, SVG-OM1, SVG-LCx, SVG-RCA --> procedure complicated by bradycardic arrest and respiratory failure (pulmonary edema) resulting in prolonged ICU admission. Developed rapid A.fib and required DCCV x 3 durrsing ICU  admission.   Seizures (HCC) 2013   only happened during cabg procedure while on the table   Sleep apnea    a.) does not use noctural PAP therapy    Family History: Family History  Problem Relation Age of Onset   Congestive Heart Failure Mother    Heart attack Mother    Cancer Father    Liver cancer Father    Cancer Paternal Grandfather     Social History   Socioeconomic History   Marital status: Widowed    Spouse name: Not on file   Number of children: 2   Years of education: Not on file   Highest education level: Not on file  Occupational History   Not on file  Tobacco Use   Smoking status: Every Day    Current packs/day: 0.50    Types: Cigarettes   Smokeless tobacco: Never   Tobacco comments:    6-7 cigs per day  Vaping Use   Vaping status: Never Used  Substance and Sexual Activity   Alcohol  use: No   Drug use: No   Sexual activity: Not Currently  Other Topics Concern   Not on file  Social History Narrative   2 sons: 68 & 36 y.o. : lives by herself    Social Drivers of Corporate Investment Banker Strain: Not on file  Food Insecurity: No Food Insecurity (07/23/2023)   Hunger Vital Sign    Worried About Running Out of Food in the Last Year: Never true    Ran Out of Food in the Last Year: Never true  Transportation Needs: No Transportation Needs (07/23/2023)   PRAPARE - Administrator, Civil Service (Medical): No    Lack of Transportation (Non-Medical): No  Physical Activity: Not on file  Stress: Not on file  Social Connections: Moderately Integrated (07/23/2023)   Social Connection and Isolation Panel    Frequency of Communication with Friends and Family: Three times a week    Frequency of Social Gatherings with Friends and Family: Twice a week    Attends Religious Services: More than 4 times per year    Active Member of [employer] or Organizations: Yes    Attends Banker Meetings: 1 to 4 times per year    Marital Status:  Widowed   Intimate Partner Violence: Not At Risk (07/23/2023)   Humiliation, Afraid, Rape, and Kick questionnaire    Fear of Current or Ex-Partner: No    Emotionally Abused: No    Physically Abused: No    Sexually Abused: No      Review of Systems  Vital Signs: BP 104/71   Pulse 77   Temp (!) 95.5 F (35.3 C)   Resp 16   Ht 5' 5 (1.651 m)   Wt 196 lb 12.8 oz (89.3 kg)   SpO2 95%   BMI 32.75 kg/m    Physical Exam     Assessment/Plan:      General Counseling: Eulogia verbalizes understanding of the findings of todays visit and agrees with plan of treatment. I have discussed any further diagnostic evaluation that may be needed or ordered today. We also reviewed her medications today. she has been encouraged to call the office with any questions or concerns that should arise related to todays visit.    No orders of the defined types were placed in this encounter.   Meds ordered this encounter  Medications   gabapentin  (NEURONTIN ) 100 MG capsule    Sig: Take 1 capsule (100 mg total) by mouth 3 (three) times daily.    Dispense:  270 capsule    Refill:  3    Please fill for 90 day supply    Return in about 3 months (around 08/02/2024) for F/U, Mykia Holton PCP.   Total time spent:30 Minutes Time spent includes review of chart, medications, test results, and follow up plan with the patient.   [place] Controlled Substance Database was reviewed by me.  This patient was seen by Mardy Maxin, FNP-C in collaboration with Dr. Sigrid Bathe as a part of collaborative care agreement.  Libertie Hausler R. Maxin, MSN, FNP-C Internal medicine

## 2024-05-05 ENCOUNTER — Encounter: Payer: Self-pay | Admitting: Nurse Practitioner

## 2024-05-06 ENCOUNTER — Telehealth: Payer: Self-pay | Admitting: Nurse Practitioner

## 2024-05-06 NOTE — Telephone Encounter (Signed)
 Podiatry appointment 05/25/2024 with Maryl Mandes  Neurology appointment 07/05/2024 with [facility]

## 2024-05-06 NOTE — Telephone Encounter (Signed)
 Neurology referral sent via [facility] to [facility].  telephone # [phone], and.....   Podiatry referral sent via [employer] to [facility]. telephone # [phone]  Left patient vm-Toni

## 2024-06-16 ENCOUNTER — Other Ambulatory Visit: Payer: Self-pay | Admitting: Nurse Practitioner

## 2024-07-06 ENCOUNTER — Other Ambulatory Visit (INDEPENDENT_AMBULATORY_CARE_PROVIDER_SITE_OTHER)

## 2024-07-06 ENCOUNTER — Encounter (INDEPENDENT_AMBULATORY_CARE_PROVIDER_SITE_OTHER)

## 2024-07-06 ENCOUNTER — Ambulatory Visit (INDEPENDENT_AMBULATORY_CARE_PROVIDER_SITE_OTHER): Admitting: Vascular Surgery

## 2024-07-13 ENCOUNTER — Ambulatory Visit (INDEPENDENT_AMBULATORY_CARE_PROVIDER_SITE_OTHER): Admitting: Nurse Practitioner

## 2024-08-03 ENCOUNTER — Ambulatory Visit: Admitting: Nurse Practitioner

## 2025-05-05 ENCOUNTER — Ambulatory Visit: Admitting: Nurse Practitioner
